# Patient Record
Sex: Female | Born: 1960 | Race: White | Hispanic: No | Marital: Married | Smoking: Former smoker
Health system: Southern US, Community
[De-identification: ages and names within clinical notes are randomized; demographics above are authoritative.]

## PROBLEM LIST (undated history)

## (undated) DIAGNOSIS — R011 Cardiac murmur, unspecified: Secondary | ICD-10-CM

## (undated) DIAGNOSIS — T8201XA Breakdown (mechanical) of heart valve prosthesis, initial encounter: Secondary | ICD-10-CM

## (undated) DIAGNOSIS — F419 Anxiety disorder, unspecified: Secondary | ICD-10-CM

## (undated) DIAGNOSIS — M797 Fibromyalgia: Secondary | ICD-10-CM

## (undated) DIAGNOSIS — R5382 Chronic fatigue, unspecified: Secondary | ICD-10-CM

## (undated) DIAGNOSIS — R519 Headache, unspecified: Secondary | ICD-10-CM

## (undated) DIAGNOSIS — D649 Anemia, unspecified: Secondary | ICD-10-CM

## (undated) DIAGNOSIS — F988 Other specified behavioral and emotional disorders with onset usually occurring in childhood and adolescence: Secondary | ICD-10-CM

## (undated) DIAGNOSIS — K5900 Constipation, unspecified: Secondary | ICD-10-CM

## (undated) DIAGNOSIS — M1990 Unspecified osteoarthritis, unspecified site: Secondary | ICD-10-CM

## (undated) DIAGNOSIS — T8859XA Other complications of anesthesia, initial encounter: Secondary | ICD-10-CM

## (undated) DIAGNOSIS — I350 Nonrheumatic aortic (valve) stenosis: Secondary | ICD-10-CM

## (undated) DIAGNOSIS — T4145XA Adverse effect of unspecified anesthetic, initial encounter: Secondary | ICD-10-CM

## (undated) DIAGNOSIS — I10 Essential (primary) hypertension: Secondary | ICD-10-CM

## (undated) DIAGNOSIS — F329 Major depressive disorder, single episode, unspecified: Secondary | ICD-10-CM

## (undated) DIAGNOSIS — M8580 Other specified disorders of bone density and structure, unspecified site: Secondary | ICD-10-CM

## (undated) DIAGNOSIS — E162 Hypoglycemia, unspecified: Secondary | ICD-10-CM

## (undated) DIAGNOSIS — N183 Chronic kidney disease, stage 3 unspecified: Secondary | ICD-10-CM

## (undated) DIAGNOSIS — E7800 Pure hypercholesterolemia, unspecified: Secondary | ICD-10-CM

## (undated) DIAGNOSIS — R0602 Shortness of breath: Secondary | ICD-10-CM

## (undated) DIAGNOSIS — K589 Irritable bowel syndrome without diarrhea: Secondary | ICD-10-CM

## (undated) DIAGNOSIS — E538 Deficiency of other specified B group vitamins: Secondary | ICD-10-CM

## (undated) DIAGNOSIS — M549 Dorsalgia, unspecified: Secondary | ICD-10-CM

## (undated) DIAGNOSIS — R002 Palpitations: Secondary | ICD-10-CM

## (undated) DIAGNOSIS — F32A Depression, unspecified: Secondary | ICD-10-CM

## (undated) DIAGNOSIS — I509 Heart failure, unspecified: Secondary | ICD-10-CM

## (undated) DIAGNOSIS — K760 Fatty (change of) liver, not elsewhere classified: Secondary | ICD-10-CM

## (undated) DIAGNOSIS — G4730 Sleep apnea, unspecified: Secondary | ICD-10-CM

## (undated) HISTORY — DX: Hypoglycemia, unspecified: E16.2

## (undated) HISTORY — DX: Anxiety disorder, unspecified: F41.9

## (undated) HISTORY — DX: Other specified behavioral and emotional disorders with onset usually occurring in childhood and adolescence: F98.8

## (undated) HISTORY — DX: Shortness of breath: R06.02

## (undated) HISTORY — DX: Sleep apnea, unspecified: G47.30

## (undated) HISTORY — DX: Fibromyalgia: M79.7

## (undated) HISTORY — DX: Headache, unspecified: R51.9

## (undated) HISTORY — DX: Other specified disorders of bone density and structure, unspecified site: M85.80

## (undated) HISTORY — DX: Depression, unspecified: F32.A

## (undated) HISTORY — DX: Dorsalgia, unspecified: M54.9

## (undated) HISTORY — DX: Major depressive disorder, single episode, unspecified: F32.9

## (undated) HISTORY — PX: PARTIAL HYSTERECTOMY: SHX80

## (undated) HISTORY — PX: TONSILLECTOMY: SUR1361

## (undated) HISTORY — PX: WISDOM TOOTH EXTRACTION: SHX21

## (undated) HISTORY — DX: Palpitations: R00.2

## (undated) HISTORY — DX: Chronic fatigue, unspecified: R53.82

## (undated) HISTORY — DX: Constipation, unspecified: K59.00

## (undated) HISTORY — DX: Pure hypercholesterolemia, unspecified: E78.00

## (undated) HISTORY — PX: COLONOSCOPY: SHX174

## (undated) HISTORY — PX: GASTRIC BYPASS: SHX52

## (undated) HISTORY — DX: Deficiency of other specified B group vitamins: E53.8

## (undated) HISTORY — DX: Chronic kidney disease, stage 3 unspecified: N18.30

## (undated) HISTORY — DX: Heart failure, unspecified: I50.9

## (undated) HISTORY — DX: Fatty (change of) liver, not elsewhere classified: K76.0

---
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
## 2014-09-10 DIAGNOSIS — R7989 Other specified abnormal findings of blood chemistry: Secondary | ICD-10-CM | POA: Insufficient documentation

## 2016-02-28 ENCOUNTER — Other Ambulatory Visit: Payer: Self-pay | Admitting: Family Medicine

## 2016-02-28 DIAGNOSIS — R7989 Other specified abnormal findings of blood chemistry: Secondary | ICD-10-CM

## 2016-02-28 DIAGNOSIS — R0602 Shortness of breath: Secondary | ICD-10-CM

## 2016-03-06 ENCOUNTER — Ambulatory Visit
Admission: RE | Admit: 2016-03-06 | Discharge: 2016-03-06 | Disposition: A | Discharge status: Home / Self Care | Payer: BLUE CROSS/BLUE SHIELD | Source: Ambulatory Visit | Attending: Family Medicine | Admitting: Family Medicine

## 2016-03-06 DIAGNOSIS — R7989 Other specified abnormal findings of blood chemistry: Secondary | ICD-10-CM

## 2016-03-06 DIAGNOSIS — R0602 Shortness of breath: Secondary | ICD-10-CM

## 2016-03-06 MED ORDER — IOPAMIDOL (ISOVUE-370) INJECTION 76%
100.0000 mL | Freq: Once | INTRAVENOUS | Status: AC | PRN
  Administered 2016-03-06: 100 mL via INTRAVENOUS

## 2016-03-09 ENCOUNTER — Other Ambulatory Visit: Payer: Self-pay

## 2016-03-15 ENCOUNTER — Telehealth: Payer: Self-pay | Admitting: Cardiovascular Disease

## 2016-03-15 NOTE — Telephone Encounter (Signed)
Records received from [facility] for apt on 03/31/16 with Dr Duke Salviaandolph. Records given to [employer] (medical records) CN

## 2016-03-31 ENCOUNTER — Encounter: Payer: Self-pay | Admitting: Cardiovascular Disease

## 2016-03-31 ENCOUNTER — Ambulatory Visit (INDEPENDENT_AMBULATORY_CARE_PROVIDER_SITE_OTHER): Payer: BLUE CROSS/BLUE SHIELD | Admitting: Cardiovascular Disease

## 2016-03-31 VITALS — BP 115/74 | HR 75 | Ht 66.0 in | Wt 307.2 lb

## 2016-03-31 DIAGNOSIS — G4730 Sleep apnea, unspecified: Secondary | ICD-10-CM | POA: Insufficient documentation

## 2016-03-31 DIAGNOSIS — R002 Palpitations: Secondary | ICD-10-CM | POA: Diagnosis not present

## 2016-03-31 DIAGNOSIS — I10 Essential (primary) hypertension: Secondary | ICD-10-CM | POA: Diagnosis not present

## 2016-03-31 DIAGNOSIS — E7800 Pure hypercholesterolemia, unspecified: Secondary | ICD-10-CM

## 2016-03-31 DIAGNOSIS — Z9884 Bariatric surgery status: Secondary | ICD-10-CM | POA: Insufficient documentation

## 2016-03-31 DIAGNOSIS — R011 Cardiac murmur, unspecified: Secondary | ICD-10-CM

## 2016-03-31 DIAGNOSIS — M797 Fibromyalgia: Secondary | ICD-10-CM | POA: Insufficient documentation

## 2016-03-31 HISTORY — DX: Palpitations: R00.2

## 2016-03-31 MED ORDER — ROSUVASTATIN CALCIUM 20 MG PO TABS: 20.0000 mg | ORAL_TABLET | Freq: Every day | ORAL | 1 refills | Status: DC

## 2016-03-31 NOTE — Progress Notes (Signed)
Cardiology Office Note   Date:  03/31/2016   ID:  Kimberly Castro, DOB [birth year], MRN [id number]  PCP:  No primary care provider on file.  Cardiologist:   Chilton Siiffany [place], MD   Chief Complaint  Patient presents with  . New Patient (Initial Visit)    palpitations; major cramping in legs. edema; in ankles. lightheaded; occasionally.      History of Present Illness: Kimberly Castro is a 55 y.o. female with OSA on CPAP, hyperlipidemia, hypertension and fibromyalgia who presents for an evaluation of palpitations.  [name] reports episodes of palpitations that have been occurring intermittently for the last 6-12 months.  The palpitations occur sporadically.  It sometimes it occurs most multiple times throughout the day and then she has other days when she has no symptoms. Each episode lasts for up to 10 or 15 minutes at a time. There is no associated shortness of breath or chest pain. She does have lightheadedness, but not at the same time when she has the palpitations.  The symptoms typically occur when she is at rest and do not occur with exertion. She walks 1 mile most days of the week and has no chest pain but does have some exertional shortness of breath that has been stable. She denies lower extremity edema, orthopnea, or PND.  She reported these symptoms to Dr. Lanora ManisElizabeth B and was referred to cardiology for further evaluation. Dr. Zachery DauerBarnes she had laboratory testing that revealed normal blood counts and normal electrolytes. 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  her thyroid function was normal. D-dimer was mildly elevated but she had a subsequent CT angiogram of the chest that was negative for PE.   In the past [name] drank up to two Diet Mt. Dews daily.  She now drinks one cup of coffee daily since reducing her caffeine intake she has not noted any changes in her palpitations.  She reports that she saw Dr. Enrigue CatenaBoshra Zakh[place]  [place] 10 years ago. She was noted to have a murmur. She underwent subsequent cardiac workup and Dr. Rockne MenghiniZakhary and was told that she likely had a murmur all her life. She hasn't had any heart failure symptoms.      Past Medical History:  Diagnosis Date  . ADD (attention deficit disorder)   . Chronic fatigue   . Depression   . Fibromyalgia   . Palpitations 03/31/2016  . Sleep apnea     No past surgical history on file.   Current Outpatient Prescriptions  Medication Sig Dispense Refill  . amphetamine-dextroamphetamine (ADDERALL XR) 30 MG 24 hr capsule 1    . clonazePAM (KLONOPIN) 0.5 MG tablet Take 0.5 mg by mouth 2 (two) times daily as needed.    . Cyanocobalamin (GNP VITAMIN B-12) 1000 MCG TBCR 1 tablet    . DULoxetine (CYMBALTA) 30 MG capsule Take 30 mg by mouth daily.    . fluticasone (FLONASE) 50 MCG/ACT nasal spray Place 1 spray into both nostrils as needed.  1  . HYDROcodone-acetaminophen (NORCO) 7.5-325 MG tablet Take 1 tablet by mouth as needed.  0  . olmesartan-hydrochlorothiazide (BENICAR HCT) 40-12.5 MG tablet Take 1 tablet by mouth daily.    Marland Kitchen. olmesartan-hydrochlorothiazide (BENICAR HCT) 40-25 MG tablet Take 1 tablet by mouth daily.  0  . predniSONE (DELTASONE) 20 MG tablet Take 20 mg by mouth daily.  0  . valACYclovir (VALTREX) 1000 MG tablet Take 1 tablet by mouth  daily.    . venlafaxine XR (EFFEXOR-XR) 150 MG 24 hr capsule Take 150 mg by mouth daily.  1  . zolpidem (AMBIEN) 10 MG tablet TK 1 T PO HS PRN  0  . rosuvastatin (CRESTOR) 20 MG tablet Take 1 tablet (20 mg total) by mouth daily. 90 tablet 1   No current facility-administered medications for this visit.     Allergies:   Dye fdc red 3 (erythrosine)    Social History:  The patient  reports that she has quit smoking. She does not have any smokeless tobacco history on file. She reports that she drinks alcohol. She reports that she does not use drugs.   Family History:  The patient's family history  includes AAA (abdominal aortic aneurysm) in her father; Atrial fibrillation in her mother; CAD in her brother; COPD in her brother, brother, father, and mother; Diabetes in her brother, maternal grandfather, maternal grandmother, and paternal grandmother; Heart disease in her father, maternal grandfather, maternal grandmother, and paternal grandfather; Heart failure in her mother; Kidney disease in her mother; Peripheral Artery Disease in her brother; Stroke in her brother and paternal grandmother.    ROS:  Please see the history of present illness.   Otherwise, review of systems are positive for none.   All other systems are reviewed and negative.    PHYSICAL EXAM: VS:  BP 115/74   Pulse 75   Ht 5\' 6"  (1.676 m)   Wt (!) 307 lb 3.2 oz (139.3 kg)   BMI 49.58 kg/m  , BMI Body mass index is 49.58 kg/m. GENERAL:  Well appearing HEENT:  Pupils equal round and reactive, fundi not visualized, oral mucosa unremarkable NECK:  No jugular venous distention, waveform within normal limits, carotid upstroke brisk and symmetric, no bruits, no thyromegaly LYMPHATICS:  No cervical adenopathy LUNGS:  Clear to auscultation bilaterally HEART:  RRR.  PMI not displaced or sustained,S1 and S2 within normal limits, no S3, no S4, no clicks, no rubs, III/VI systolic murmur ABD:  Flat, positive bowel sounds normal in frequency in pitch, no bruits, no rebound, no guarding, no midline pulsatile mass, no hepatomegaly, no splenomegaly EXT:  2 plus pulses throughout, no edema, no cyanosis no clubbing SKIN:  No rashes no nodules NEURO:  Cranial nerves II through XII grossly intact, motor grossly intact throughout PSYCH:  Cognitively intact, oriented to person place and time   EKG:  EKG is ordered today. The ekg ordered today demonstrates sinus rhythm rate 75 bpm.    Recent Labs: No results found for requested labs within last 8760 hours.    02/25/16: WBC 10.1, hemoglobin 12.1, hematocrit 36.2, platelets 319 Sodium  143, potassium 4.5, BUN 24, creatinine 1.05 AST 27, ALT 24 TSH 0.886 D-dimer 0.68 Troponin I less than 0.01 Total cholesterol 262, triglycerides 131, HDL 75, LDL 161  Lipid Panel No results found for: CHOL, TRIG, HDL, CHOLHDL, VLDL, LDLCALC, LDLDIRECT    Wt Readings from Last 3 Encounters:  03/31/16 (!) 307 lb 3.2 oz (139.3 kg)      ASSESSMENT AND PLAN:  # Palpitations: Ms. Kimberly Josephs has recurrent palpitations that are likely PACs or PVCs.  Laboratory testing revealed normal electrolytes and blood counts.  Her glucose was low at 44, which could contribute to palpitations.  Her EKG today revealed sinus rhythm, so we will get a 7 day Event Monitor to better evaluate.  # Hypertension:  Blood pressure is well controlled. Continue olmesartan and HCTZ.   # Hyperlipidemia: Lipids are poorly-controlled on  simvastatin.  She also reports muscle cramps.  We will stop simvastatin for 2 weeks and she will then start rosuvastatin 20 mg daily.  # Murmur: Asymptomatic.  We will obtain her prior records for review.   Current medicines are reviewed at length with the patient today.  The patient does not have concerns regarding medicines.  The following changes have been made:  Stop simvastatin.  Start rosuvastatin.   Labs/ tests ordered today include:   Orders Placed This Encounter  Procedures  . Cardiac event monitor  . EKG 12-Lead     Disposition:   FU with Jacory Kamel C. Duke Salvia, MD, [facility] in 6 weeks.    This note was written with the assistance of speech recognition software.  Please excuse any transcriptional errors.  Signed, Ismael Karge C. Duke Salvia, MD, [facility]  03/31/2016 1:37 PM    [facility]

## 2016-03-31 NOTE — Patient Instructions (Addendum)
Medication Instructions:  STOP YOUR SIMVASTATIN   START ROSUVASTATIN 20 MG ONE DAILY AFTER 2 WEEKS OF BEING OFF THE SIMVASTATIN   Labwork: NONE  Testing/Procedures: Your physician has recommended that you wear an event monitor. Event monitors are medical devices that record the heart's electrical activity. Doctors most often us these monitors to diagnose arrhythmias. Arrhythmias are problems with the speed or rhythm of the heartbeat. The monitor is a small, portable device. You can wear one while you do your normal daily activities. This is usually used to diagnose what is causing palpitations/syncope (passing out). 7 DAY   Follow-Up: Your physician recommends that you schedule a follow-up appointment in: 6 WEEKS   If you need a refill on your cardiac medications before your next appointment, please call your pharmacy.

## 2016-04-03 ENCOUNTER — Ambulatory Visit (INDEPENDENT_AMBULATORY_CARE_PROVIDER_SITE_OTHER): Payer: BLUE CROSS/BLUE SHIELD

## 2016-04-03 DIAGNOSIS — R002 Palpitations: Secondary | ICD-10-CM

## 2016-05-09 DIAGNOSIS — Z90710 Acquired absence of both cervix and uterus: Secondary | ICD-10-CM | POA: Insufficient documentation

## 2016-05-10 ENCOUNTER — Encounter: Payer: Self-pay | Admitting: *Deleted

## 2016-05-15 ENCOUNTER — Ambulatory Visit: Payer: BLUE CROSS/BLUE SHIELD | Admitting: Cardiovascular Disease

## 2016-05-15 NOTE — Progress Notes (Deleted)
Cardiology Office Note   Date:  05/15/2016   ID:  Kimberly Castro, DOB [birth year], MRN [id number]  PCP:  Kimberly LabellaMILLER,LISA LYNN, MD  Cardiologist:   Kimberly Siiffany [place], MD   No chief complaint on file.     History of Present Illness: Kimberly Castro is a 55 y.o. female with OSA on CPAP, hyperlipidemia, hypertension and fibromyalgia who presents for follow up on palpitations.  [name] was seen 03/31/16 and reported palpitations.  She exercises regularly and denies exertional symptoms.  [name] wore an 11 day event monitor 03/24/16 that revealed PACs, PVCs, and atrial runs.  Blood counts and electrolytes were within normal limits.  Her thyroid function was normal. D-dimer was mildly elevated but she had a subsequent CT angiogram of the chest that was negative for PE.       In the past [name] drank up to two Diet Mt. Dews daily.  She now drinks one cup of coffee daily since reducing her caffeine intake she has not noted any changes in her palpitations.  She reports that she saw Dr. Enrigue CatenaBoshra [place] in [place] 10 [place] ago. She was noted to have a murmur. She underwent subsequent cardiac workup and Dr. Rockne Castro and was told that she likely had a murmur all her life. She hasn't had any heart failure symptoms.      Past Medical History:  Diagnosis Date  . ADD (attention deficit disorder)   . Chronic fatigue   . Depression   . Fibromyalgia   . Palpitations 03/31/2016  . Sleep apnea     No past surgical history on file.   Current Outpatient Prescriptions  Medication Sig Dispense Refill  . amphetamine-dextroamphetamine (ADDERALL XR) 30 MG 24 hr capsule 1    . clonazePAM (KLONOPIN) 0.5 MG tablet Take 0.5 mg by mouth 2 (two) times daily as needed.    . Cyanocobalamin (GNP VITAMIN B-12) 1000 MCG TBCR 1 tablet    . DULoxetine (CYMBALTA) 30 MG capsule Take 30 mg by mouth daily.    . fluticasone (FLONASE) 50 MCG/ACT nasal spray Place 1 spray into both nostrils as needed.  1  .  HYDROcodone-acetaminophen (NORCO) 7.5-325 MG tablet Take 1 tablet by mouth as needed.  0  . olmesartan-hydrochlorothiazide (BENICAR HCT) 40-12.5 MG tablet Take 1 tablet by mouth daily.    Marland Kitchen. olmesartan-hydrochlorothiazide (BENICAR HCT) 40-25 MG tablet Take 1 tablet by mouth daily.  0  . predniSONE (DELTASONE) 20 MG tablet Take 20 mg by mouth daily.  0  . rosuvastatin (CRESTOR) 20 MG tablet Take 1 tablet (20 mg total) by mouth daily. 90 tablet 1  . valACYclovir (VALTREX) 1000 MG tablet Take 1 tablet by mouth daily.    Marland Kitchen. venlafaxine XR (EFFEXOR-XR) 150 MG 24 hr capsule Take 150 mg by mouth daily.  1  . zolpidem (AMBIEN) 10 MG tablet TK 1 T PO HS PRN  0   No current facility-administered medications for this visit.     Allergies:   Dye fdc red 3 (erythrosine)    Social History:  The patient  reports that she has quit smoking. She does not have any smokeless tobacco history on file. She reports that she drinks alcohol. She reports that she does not use drugs.   Family History:  The patient's family history includes AAA (abdominal aortic aneurysm) in her father; Atrial fibrillation in her mother; CAD in her brother; COPD in her brother, brother, father, and mother; Diabetes in her brother, maternal grandfather, maternal  grandmother, and paternal grandmother; Heart disease in her father, maternal grandfather, maternal grandmother, and paternal grandfather; Heart failure in her mother; Kidney disease in her mother; Peripheral Artery Disease in her brother; Stroke in her brother and paternal grandmother.    ROS:  Please see the history of present illness.   Otherwise, review of systems are positive for none.   All other systems are reviewed and negative.    PHYSICAL EXAM: VS:  There were no vitals taken for this visit. , BMI There is no height or weight on file to calculate BMI. GENERAL:  Well appearing HEENT:  Pupils equal round and reactive, fundi not visualized, oral mucosa unremarkable NECK:   No jugular venous distention, waveform within normal limits, carotid upstroke brisk and symmetric, no bruits, no thyromegaly LYMPHATICS:  No cervical adenopathy LUNGS:  Clear to auscultation bilaterally HEART:  RRR.  PMI not displaced or sustained,S1 and S2 within normal limits, no S3, no S4, no clicks, no rubs, III/VI systolic murmur ABD:  Flat, positive bowel sounds normal in frequency in pitch, no bruits, no rebound, no guarding, no midline pulsatile mass, no hepatomegaly, no splenomegaly EXT:  2 plus pulses throughout, no edema, no cyanosis no clubbing SKIN:  No rashes no nodules NEURO:  Cranial nerves II through XII grossly intact, motor grossly intact throughout PSYCH:  Cognitively intact, oriented to person place and time   EKG:  EKG is ordered today. The ekg ordered today demonstrates sinus rhythm rate 75 bpm.   11 Day Event Monitor 04/03/16:  Quality: Fair.  Baseline artifact. Predominant rhythm: sinus rhythm  PVCs, PACs and atrial runs were noted  Recent Labs: No results found for requested labs within last 8760 hours.    02/25/16: WBC 10.1, hemoglobin 12.1, hematocrit 36.2, platelets 319 Sodium 143, potassium 4.5, BUN 24, creatinine 1.05 AST 27, ALT 24 TSH 0.886 D-dimer 0.68 Troponin I less than 0.01 Total cholesterol 262, triglycerides 131, HDL 75, LDL 161  Lipid Panel No results found for: CHOL, TRIG, HDL, CHOLHDL, VLDL, LDLCALC, LDLDIRECT    Wt Readings from Last 3 Encounters:  03/31/16 (!) 139.3 kg (307 lb 3.2 oz)      ASSESSMENT AND PLAN:  # Palpitations: [name] has recurrent palpitations that are likely PACs or PVCs.  Laboratory testing revealed normal electrolytes and blood counts.  Her glucose was low at 44, which could contribute to palpitations.  Her EKG today revealed sinus rhythm, so we will get a 7 day Event Monitor to better evaluate.  # Hypertension:  Blood pressure is well controlled. Continue olmesartan and HCTZ.   # Hyperlipidemia:  Lipids are poorly-controlled on simvastatin.  She also reports muscle cramps.  We will stop simvastatin for 2 weeks and she will then start rosuvastatin 20 mg daily.  # Murmur: Asymptomatic.  We will obtain her prior records for review.   Current medicines are reviewed at length with the patient today.  The patient does not have concerns regarding medicines.  The following changes have been made:  Stop simvastatin.  Start rosuvastatin.   Labs/ tests ordered today include:   No orders of the defined types were placed in this encounter.    Disposition:   FU with Bettyanne Dittman C. Duke Salviaandolph, MD, [facility] in 6 weeks.    This note was written with the assistance of speech recognition software.  Please excuse any transcriptional errors.  Signed, Lexandra Rettke C. Duke Salviaandolph, MD, [facility]  05/15/2016 8:04 AM    [facility]

## 2016-08-12 ENCOUNTER — Emergency Department (HOSPITAL_COMMUNITY): Payer: Managed Care, Other (non HMO)

## 2016-08-12 ENCOUNTER — Encounter (HOSPITAL_COMMUNITY): Admission: EM | Disposition: A | Discharge status: Home / Self Care | Payer: Self-pay | Source: Home / Self Care

## 2016-08-12 ENCOUNTER — Encounter (HOSPITAL_COMMUNITY): Payer: Self-pay

## 2016-08-12 ENCOUNTER — Observation Stay (HOSPITAL_COMMUNITY): Payer: Managed Care, Other (non HMO) | Admitting: Anesthesiology

## 2016-08-12 ENCOUNTER — Inpatient Hospital Stay (HOSPITAL_COMMUNITY)
Admission: EM | Admit: 2016-08-12 | Discharge: 2016-08-16 | DRG: 354 | Disposition: A | Discharge status: Home / Self Care | Payer: Managed Care, Other (non HMO) | Attending: Orthopaedic Surgery | Admitting: Orthopaedic Surgery

## 2016-08-12 DIAGNOSIS — I10 Essential (primary) hypertension: Secondary | ICD-10-CM | POA: Diagnosis present

## 2016-08-12 DIAGNOSIS — F988 Other specified behavioral and emotional disorders with onset usually occurring in childhood and adolescence: Secondary | ICD-10-CM | POA: Diagnosis present

## 2016-08-12 DIAGNOSIS — Z833 Family history of diabetes mellitus: Secondary | ICD-10-CM

## 2016-08-12 DIAGNOSIS — M797 Fibromyalgia: Secondary | ICD-10-CM | POA: Diagnosis present

## 2016-08-12 DIAGNOSIS — R51 Headache: Secondary | ICD-10-CM | POA: Diagnosis not present

## 2016-08-12 DIAGNOSIS — Z87891 Personal history of nicotine dependence: Secondary | ICD-10-CM

## 2016-08-12 DIAGNOSIS — Z823 Family history of stroke: Secondary | ICD-10-CM

## 2016-08-12 DIAGNOSIS — Z825 Family history of asthma and other chronic lower respiratory diseases: Secondary | ICD-10-CM

## 2016-08-12 DIAGNOSIS — R1013 Epigastric pain: Secondary | ICD-10-CM | POA: Diagnosis not present

## 2016-08-12 DIAGNOSIS — Z841 Family history of disorders of kidney and ureter: Secondary | ICD-10-CM

## 2016-08-12 DIAGNOSIS — Z7951 Long term (current) use of inhaled steroids: Secondary | ICD-10-CM

## 2016-08-12 DIAGNOSIS — B001 Herpesviral vesicular dermatitis: Secondary | ICD-10-CM | POA: Diagnosis not present

## 2016-08-12 DIAGNOSIS — Z0189 Encounter for other specified special examinations: Secondary | ICD-10-CM

## 2016-08-12 DIAGNOSIS — R05 Cough: Secondary | ICD-10-CM | POA: Diagnosis not present

## 2016-08-12 DIAGNOSIS — Z6841 Body Mass Index (BMI) 40.0 and over, adult: Secondary | ICD-10-CM

## 2016-08-12 DIAGNOSIS — Z9884 Bariatric surgery status: Secondary | ICD-10-CM

## 2016-08-12 DIAGNOSIS — K56609 Unspecified intestinal obstruction, unspecified as to partial versus complete obstruction: Secondary | ICD-10-CM | POA: Diagnosis present

## 2016-08-12 DIAGNOSIS — E662 Morbid (severe) obesity with alveolar hypoventilation: Secondary | ICD-10-CM | POA: Diagnosis present

## 2016-08-12 DIAGNOSIS — Z91041 Radiographic dye allergy status: Secondary | ICD-10-CM

## 2016-08-12 DIAGNOSIS — Z8249 Family history of ischemic heart disease and other diseases of the circulatory system: Secondary | ICD-10-CM

## 2016-08-12 DIAGNOSIS — R0981 Nasal congestion: Secondary | ICD-10-CM | POA: Diagnosis not present

## 2016-08-12 DIAGNOSIS — R21 Rash and other nonspecific skin eruption: Secondary | ICD-10-CM | POA: Diagnosis present

## 2016-08-12 DIAGNOSIS — K219 Gastro-esophageal reflux disease without esophagitis: Secondary | ICD-10-CM | POA: Diagnosis present

## 2016-08-12 DIAGNOSIS — F329 Major depressive disorder, single episode, unspecified: Secondary | ICD-10-CM | POA: Diagnosis present

## 2016-08-12 DIAGNOSIS — K460 Unspecified abdominal hernia with obstruction, without gangrene: Secondary | ICD-10-CM | POA: Diagnosis not present

## 2016-08-12 DIAGNOSIS — Z91048 Other nonmedicinal substance allergy status: Secondary | ICD-10-CM

## 2016-08-12 HISTORY — PX: LAPAROSCOPY: SHX197

## 2016-08-12 HISTORY — DX: Essential (primary) hypertension: I10

## 2016-08-12 LAB — CBC
HCT: 38.6 % (ref 36.0–46.0)
Hemoglobin: 12.7 g/dL (ref 12.0–15.0)
MCH: 28.9 pg (ref 26.0–34.0)
MCHC: 32.9 g/dL (ref 30.0–36.0)
MCV: 87.7 fL (ref 78.0–100.0)
Platelets: 286 10*3/uL (ref 150–400)
RBC: 4.4 MIL/uL (ref 3.87–5.11)
RDW: 13.1 % (ref 11.5–15.5)
WBC: 8.9 10*3/uL (ref 4.0–10.5)

## 2016-08-12 LAB — COMPREHENSIVE METABOLIC PANEL
ALT: 29 U/L (ref 14–54)
AST: 27 U/L (ref 15–41)
Albumin: 4 g/dL (ref 3.5–5.0)
Alkaline Phosphatase: 87 U/L (ref 38–126)
Anion gap: 14 (ref 5–15)
BUN: 15 mg/dL (ref 6–20)
CO2: 26 mmol/L (ref 22–32)
Calcium: 9.7 mg/dL (ref 8.9–10.3)
Chloride: 99 mmol/L — ABNORMAL LOW (ref 101–111)
Creatinine, Ser: 0.9 mg/dL (ref 0.44–1.00)
GFR calc Af Amer: >60 mL/min (ref >60)
GFR calc non Af Amer: >60 mL/min (ref >60)
Glucose, Bld: 101 mg/dL — ABNORMAL HIGH (ref 65–99)
Potassium: 3.7 mmol/L (ref 3.5–5.1)
Sodium: 139 mmol/L (ref 135–145)
Total Bilirubin: 0.3 mg/dL (ref 0.3–1.2)
Total Protein: 7.2 g/dL (ref 6.5–8.1)

## 2016-08-12 LAB — CREATININE, SERUM
Creatinine, Ser: 0.93 mg/dL (ref 0.44–1.00)
GFR calc Af Amer: >60 mL/min (ref >60)
GFR calc non Af Amer: >60 mL/min (ref >60)

## 2016-08-12 LAB — URINALYSIS, ROUTINE W REFLEX MICROSCOPIC
Bilirubin Urine: NEGATIVE
Glucose, UA: NEGATIVE mg/dL
Hgb urine dipstick: NEGATIVE
Ketones, ur: NEGATIVE mg/dL
Leukocytes, UA: NEGATIVE
Nitrite: NEGATIVE
Protein, ur: NEGATIVE mg/dL
Specific Gravity, Urine: >1.046 — ABNORMAL HIGH (ref 1.005–1.030)
pH: 7 (ref 5.0–8.0)

## 2016-08-12 LAB — CBC WITH DIFFERENTIAL/PLATELET
Basophils Absolute: 0 10*3/uL (ref 0.0–0.1)
Basophils Relative: 0 %
Eosinophils Absolute: 0.2 10*3/uL (ref 0.0–0.7)
Eosinophils Relative: 2 %
HCT: 38 % (ref 36.0–46.0)
Hemoglobin: 12.5 g/dL (ref 12.0–15.0)
Lymphocytes Relative: 32 %
Lymphs Abs: 2.6 10*3/uL (ref 0.7–4.0)
MCH: 29.1 pg (ref 26.0–34.0)
MCHC: 32.9 g/dL (ref 30.0–36.0)
MCV: 88.6 fL (ref 78.0–100.0)
Monocytes Absolute: 0.8 10*3/uL (ref 0.1–1.0)
Monocytes Relative: 10 %
Neutro Abs: 4.6 10*3/uL (ref 1.7–7.7)
Neutrophils Relative %: 56 %
Platelets: 260 10*3/uL (ref 150–400)
RBC: 4.29 MIL/uL (ref 3.87–5.11)
RDW: 13.3 % (ref 11.5–15.5)
WBC: 8.1 10*3/uL (ref 4.0–10.5)

## 2016-08-12 LAB — SURGICAL PCR SCREEN
MRSA, PCR: NEGATIVE
Staphylococcus aureus: NEGATIVE

## 2016-08-12 LAB — LIPASE, BLOOD: Lipase: 24 U/L (ref 11–51)

## 2016-08-12 SURGERY — LAPAROSCOPY, DIAGNOSTIC
Anesthesia: General

## 2016-08-12 MED ORDER — SODIUM CHLORIDE 0.9 % IV SOLN
INTRAVENOUS | Status: DC
  Administered 2016-08-12 – 2016-08-14 (×8): via INTRAVENOUS

## 2016-08-12 MED ORDER — DEXTROSE 5 % IV SOLN
3.0000 g | Freq: Once | INTRAVENOUS | Status: DC
  Filled 2016-08-12: qty 3000

## 2016-08-12 MED ORDER — PROPOFOL 10 MG/ML IV BOLUS
INTRAVENOUS | Status: AC
  Filled 2016-08-12: qty 20

## 2016-08-12 MED ORDER — PHENYLEPHRINE 40 MCG/ML (10ML) SYRINGE FOR IV PUSH (FOR BLOOD PRESSURE SUPPORT)
PREFILLED_SYRINGE | INTRAVENOUS | Status: AC
  Filled 2016-08-12: qty 30

## 2016-08-12 MED ORDER — ONDANSETRON 4 MG PO TBDP
4.0000 mg | ORAL_TABLET | Freq: Four times a day (QID) | ORAL | Status: DC | PRN
Start: 2016-08-12 — End: 2016-08-16

## 2016-08-12 MED ORDER — LIDOCAINE 2% (20 MG/ML) 5 ML SYRINGE
INTRAMUSCULAR | Status: AC
  Filled 2016-08-12: qty 5

## 2016-08-12 MED ORDER — PHENYLEPHRINE HCL 10 MG/ML IJ SOLN
INTRAMUSCULAR | Status: DC | PRN
  Administered 2016-08-12: 50 ug/min via INTRAVENOUS

## 2016-08-12 MED ORDER — MIDAZOLAM HCL 2 MG/2ML IJ SOLN
INTRAMUSCULAR | Status: AC
  Filled 2016-08-12: qty 2

## 2016-08-12 MED ORDER — EPHEDRINE SULFATE 50 MG/ML IJ SOLN
INTRAMUSCULAR | Status: DC | PRN
  Administered 2016-08-12 (×2): 10 mg via INTRAVENOUS
  Administered 2016-08-12: 5 mg via INTRAVENOUS

## 2016-08-12 MED ORDER — IOPAMIDOL (ISOVUE-300) INJECTION 61%
INTRAVENOUS | Status: AC
  Administered 2016-08-12: 100 mL
  Filled 2016-08-12: qty 100

## 2016-08-12 MED ORDER — OXYCODONE HCL 5 MG PO TABS: 5.0000 mg | ORAL_TABLET | Freq: Once | ORAL | Status: DC | PRN

## 2016-08-12 MED ORDER — ROCURONIUM BROMIDE 50 MG/5ML IV SOSY
PREFILLED_SYRINGE | INTRAVENOUS | Status: AC
  Filled 2016-08-12: qty 5

## 2016-08-12 MED ORDER — FENTANYL CITRATE (PF) 100 MCG/2ML IJ SOLN
INTRAMUSCULAR | Status: AC
  Filled 2016-08-12: qty 2

## 2016-08-12 MED ORDER — CEFAZOLIN SODIUM 1 G IJ SOLR
INTRAMUSCULAR | Status: DC | PRN
  Administered 2016-08-12: 3 g via INTRAMUSCULAR

## 2016-08-12 MED ORDER — PROMETHAZINE HCL 25 MG/ML IJ SOLN
12.5000 mg | Freq: Once | INTRAMUSCULAR | Status: AC
  Administered 2016-08-12: 12.5 mg via INTRAVENOUS
  Filled 2016-08-12: qty 1

## 2016-08-12 MED ORDER — ONDANSETRON HCL 4 MG/2ML IJ SOLN
4.0000 mg | Freq: Once | INTRAMUSCULAR | Status: AC
  Administered 2016-08-12: 4 mg via INTRAVENOUS
  Filled 2016-08-12: qty 2

## 2016-08-12 MED ORDER — FENTANYL CITRATE (PF) 100 MCG/2ML IJ SOLN
25.0000 ug | INTRAMUSCULAR | Status: DC | PRN
  Administered 2016-08-12: 25 ug via INTRAVENOUS
  Administered 2016-08-12: 50 ug via INTRAVENOUS

## 2016-08-12 MED ORDER — CEFAZOLIN (ANCEF) 1 G IV SOLR: 3.0000 g | INTRAVENOUS | Status: DC

## 2016-08-12 MED ORDER — ACETAMINOPHEN 325 MG PO TABS
650.0000 mg | ORAL_TABLET | Freq: Four times a day (QID) | ORAL | Status: DC | PRN
  Administered 2016-08-13 (×2): 650 mg via ORAL
  Filled 2016-08-12 (×2): qty 2

## 2016-08-12 MED ORDER — ONDANSETRON HCL 4 MG/2ML IJ SOLN: 4.0000 mg | Freq: Four times a day (QID) | INTRAMUSCULAR | Status: DC | PRN

## 2016-08-12 MED ORDER — 0.9 % SODIUM CHLORIDE (POUR BTL) OPTIME
TOPICAL | Status: DC | PRN
Start: 2016-08-12 — End: 2016-08-12
  Administered 2016-08-12: 1000 mL

## 2016-08-12 MED ORDER — MORPHINE SULFATE (PF) 4 MG/ML IV SOLN
INTRAVENOUS | Status: AC
  Filled 2016-08-12: qty 1

## 2016-08-12 MED ORDER — ONDANSETRON HCL 4 MG/2ML IJ SOLN
INTRAMUSCULAR | Status: AC
  Filled 2016-08-12: qty 2

## 2016-08-12 MED ORDER — PHENYLEPHRINE 40 MCG/ML (10ML) SYRINGE FOR IV PUSH (FOR BLOOD PRESSURE SUPPORT)
PREFILLED_SYRINGE | INTRAVENOUS | Status: DC | PRN
  Administered 2016-08-12: 120 ug via INTRAVENOUS
  Administered 2016-08-12: 80 ug via INTRAVENOUS
  Administered 2016-08-12: 40 ug via INTRAVENOUS
  Administered 2016-08-12: 80 ug via INTRAVENOUS

## 2016-08-12 MED ORDER — PROMETHAZINE HCL 25 MG/ML IJ SOLN
INTRAMUSCULAR | Status: AC
  Administered 2016-08-12: 12.5 mg via INTRAVENOUS
  Filled 2016-08-12: qty 1

## 2016-08-12 MED ORDER — ACETAMINOPHEN 650 MG RE SUPP
650.0000 mg | Freq: Four times a day (QID) | RECTAL | Status: DC | PRN
  Filled 2016-08-12: qty 1

## 2016-08-12 MED ORDER — FENTANYL CITRATE (PF) 100 MCG/2ML IJ SOLN
100.0000 ug | Freq: Once | INTRAMUSCULAR | Status: AC
  Administered 2016-08-12: 100 ug via INTRAVENOUS
  Filled 2016-08-12: qty 2

## 2016-08-12 MED ORDER — FENTANYL CITRATE (PF) 100 MCG/2ML IJ SOLN
INTRAMUSCULAR | Status: AC
  Administered 2016-08-12: 25 ug via INTRAVENOUS
  Filled 2016-08-12: qty 2

## 2016-08-12 MED ORDER — DIPHENHYDRAMINE HCL 50 MG/ML IJ SOLN: 25.0000 mg | Freq: Four times a day (QID) | INTRAMUSCULAR | Status: DC | PRN

## 2016-08-12 MED ORDER — HYDROMORPHONE HCL 2 MG/ML IJ SOLN
1.0000 mg | Freq: Once | INTRAMUSCULAR | Status: AC
  Administered 2016-08-12: 1 mg via INTRAVENOUS
  Filled 2016-08-12: qty 1

## 2016-08-12 MED ORDER — FENTANYL CITRATE (PF) 100 MCG/2ML IJ SOLN
50.0000 ug | Freq: Once | INTRAMUSCULAR | Status: AC
  Administered 2016-08-12: 50 ug via INTRAVENOUS
  Filled 2016-08-12: qty 2

## 2016-08-12 MED ORDER — MIDAZOLAM HCL 5 MG/5ML IJ SOLN
INTRAMUSCULAR | Status: DC | PRN
  Administered 2016-08-12: 2 mg via INTRAVENOUS

## 2016-08-12 MED ORDER — SUGAMMADEX SODIUM 200 MG/2ML IV SOLN
INTRAVENOUS | Status: DC | PRN
  Administered 2016-08-12: 400 mg via INTRAVENOUS

## 2016-08-12 MED ORDER — OXYCODONE HCL 5 MG/5ML PO SOLN: 5.0000 mg | Freq: Once | ORAL | Status: DC | PRN

## 2016-08-12 MED ORDER — LACTATED RINGERS IV SOLN
INTRAVENOUS | Status: DC
  Administered 2016-08-12: 14:00:00 via INTRAVENOUS

## 2016-08-12 MED ORDER — METOCLOPRAMIDE HCL 5 MG/ML IJ SOLN
INTRAMUSCULAR | Status: AC
  Filled 2016-08-12: qty 2

## 2016-08-12 MED ORDER — DIATRIZOATE MEGLUMINE & SODIUM 66-10 % PO SOLN: 90.0000 mL | Freq: Once | ORAL | Status: DC

## 2016-08-12 MED ORDER — METOCLOPRAMIDE HCL 5 MG/ML IJ SOLN
10.0000 mg | Freq: Once | INTRAMUSCULAR | Status: AC
  Administered 2016-08-12: 10 mg via INTRAVENOUS
  Filled 2016-08-12: qty 2

## 2016-08-12 MED ORDER — ROCURONIUM BROMIDE 100 MG/10ML IV SOLN
INTRAVENOUS | Status: DC | PRN
  Administered 2016-08-12: 50 mg via INTRAVENOUS
  Administered 2016-08-12: 20 mg via INTRAVENOUS
  Administered 2016-08-12: 10 mg via INTRAVENOUS

## 2016-08-12 MED ORDER — ENOXAPARIN SODIUM 40 MG/0.4ML ~~LOC~~ SOLN
40.0000 mg | SUBCUTANEOUS | Status: DC
  Administered 2016-08-13 – 2016-08-15 (×3): 40 mg via SUBCUTANEOUS
  Filled 2016-08-12 (×3): qty 0.4

## 2016-08-12 MED ORDER — PANTOPRAZOLE SODIUM 40 MG IV SOLR
40.0000 mg | Freq: Every day | INTRAVENOUS | Status: DC
  Administered 2016-08-12 – 2016-08-14 (×3): 40 mg via INTRAVENOUS
  Filled 2016-08-12 (×3): qty 40

## 2016-08-12 MED ORDER — CEFAZOLIN SODIUM 1 G IJ SOLR
INTRAMUSCULAR | Status: AC
  Filled 2016-08-12: qty 60

## 2016-08-12 MED ORDER — LIDOCAINE HCL (CARDIAC) 20 MG/ML IV SOLN
INTRAVENOUS | Status: DC | PRN
  Administered 2016-08-12: 40 mg via INTRAVENOUS

## 2016-08-12 MED ORDER — PROPOFOL 10 MG/ML IV BOLUS
INTRAVENOUS | Status: DC | PRN
  Administered 2016-08-12: 140 mg via INTRAVENOUS

## 2016-08-12 MED ORDER — FENTANYL CITRATE (PF) 100 MCG/2ML IJ SOLN
INTRAMUSCULAR | Status: DC | PRN
  Administered 2016-08-12 (×2): 50 ug via INTRAVENOUS

## 2016-08-12 MED ORDER — SODIUM CHLORIDE 0.9 % IR SOLN
Status: DC | PRN
  Administered 2016-08-12: 1

## 2016-08-12 MED ORDER — ALBUMIN HUMAN 5 % IV SOLN
INTRAVENOUS | Status: DC | PRN
  Administered 2016-08-12 (×3): via INTRAVENOUS

## 2016-08-12 MED ORDER — LACTATED RINGERS IV SOLN
INTRAVENOUS | Status: DC | PRN
  Administered 2016-08-12 (×2): via INTRAVENOUS

## 2016-08-12 MED ORDER — EPHEDRINE 5 MG/ML INJ
INTRAVENOUS | Status: AC
  Filled 2016-08-12: qty 30

## 2016-08-12 MED ORDER — BUPIVACAINE HCL (PF) 0.25 % IJ SOLN
INTRAMUSCULAR | Status: DC | PRN
Start: 2016-08-12 — End: 2016-08-12
  Administered 2016-08-12: 8 mL

## 2016-08-12 MED ORDER — SUCCINYLCHOLINE CHLORIDE 20 MG/ML IJ SOLN
INTRAMUSCULAR | Status: DC | PRN
  Administered 2016-08-12: 80 mg via INTRAVENOUS

## 2016-08-12 MED ORDER — ONDANSETRON HCL 4 MG/2ML IJ SOLN
INTRAMUSCULAR | Status: DC | PRN
  Administered 2016-08-12: 4 mg via INTRAVENOUS

## 2016-08-12 MED ORDER — EPHEDRINE SULFATE-NACL 50-0.9 MG/10ML-% IV SOSY
PREFILLED_SYRINGE | INTRAVENOUS | Status: DC | PRN
  Administered 2016-08-12 (×2): 10 mg via INTRAVENOUS

## 2016-08-12 MED ORDER — MORPHINE SULFATE (PF) 4 MG/ML IV SOLN
2.0000 mg | INTRAVENOUS | Status: DC | PRN
  Administered 2016-08-12 – 2016-08-13 (×5): 4 mg via INTRAVENOUS
  Filled 2016-08-12 (×4): qty 1

## 2016-08-12 MED ORDER — LACTATED RINGERS IV SOLN
INTRAVENOUS | Status: DC | PRN
  Administered 2016-08-12 (×2): via INTRAVENOUS

## 2016-08-12 MED ORDER — PHENYLEPHRINE HCL 10 MG/ML IJ SOLN
INTRAMUSCULAR | Status: AC
  Filled 2016-08-12: qty 2

## 2016-08-12 MED ORDER — BUPIVACAINE HCL (PF) 0.25 % IJ SOLN
INTRAMUSCULAR | Status: AC
  Filled 2016-08-12: qty 30

## 2016-08-12 MED ORDER — MORPHINE SULFATE (PF) 4 MG/ML IV SOLN
INTRAVENOUS | Status: AC
  Administered 2016-08-12: 4 mg via INTRAVENOUS
  Filled 2016-08-12: qty 1

## 2016-08-12 SURGICAL SUPPLY — 59 items
APPLIER CLIP 5 13 M/L LIGAMAX5 (MISCELLANEOUS)
BLADE CLIPPER SURG (BLADE) IMPLANT
CANISTER SUCT 3000ML PPV (MISCELLANEOUS) ×2 IMPLANT
CHLORAPREP W/TINT 26ML (MISCELLANEOUS) ×2 IMPLANT
CLIP APPLIE 5 13 M/L LIGAMAX5 (MISCELLANEOUS) IMPLANT
COVER SURGICAL LIGHT HANDLE (MISCELLANEOUS) ×2 IMPLANT
DECANTER SPIKE VIAL GLASS SM (MISCELLANEOUS) ×2 IMPLANT
DERMABOND ADHESIVE PROPEN (GAUZE/BANDAGES/DRESSINGS) ×1
DERMABOND ADVANCED (GAUZE/BANDAGES/DRESSINGS) ×2
DERMABOND ADVANCED .7 DNX12 (GAUZE/BANDAGES/DRESSINGS) ×2 IMPLANT
DERMABOND ADVANCED .7 DNX6 (GAUZE/BANDAGES/DRESSINGS) ×1 IMPLANT
DEVICE SUTURE ENDOST 10MM (ENDOMECHANICALS) ×2 IMPLANT
DRAPE C-ARM 42X72 X-RAY (DRAPES) IMPLANT
DRAPE LAPAROSCOPIC ABDOMINAL (DRAPES) ×2 IMPLANT
DRAPE WARM FLUID 44X44 (DRAPE) IMPLANT
DRSG OPSITE POSTOP 4X10 (GAUZE/BANDAGES/DRESSINGS) IMPLANT
DRSG OPSITE POSTOP 4X8 (GAUZE/BANDAGES/DRESSINGS) IMPLANT
ELECT BLADE 6.5 EXT (BLADE) IMPLANT
ELECT CAUTERY BLADE 6.4 (BLADE) IMPLANT
ELECT REM PT RETURN 9FT ADLT (ELECTROSURGICAL) ×2
ELECTRODE REM PT RTRN 9FT ADLT (ELECTROSURGICAL) ×1 IMPLANT
GLOVE SURG SIGNA 7.5 PF LTX (GLOVE) IMPLANT
GOWN STRL REUS W/ TWL LRG LVL3 (GOWN DISPOSABLE) ×2 IMPLANT
GOWN STRL REUS W/ TWL XL LVL3 (GOWN DISPOSABLE) ×1 IMPLANT
GOWN STRL REUS W/TWL LRG LVL3 (GOWN DISPOSABLE) ×2
GOWN STRL REUS W/TWL XL LVL3 (GOWN DISPOSABLE) ×1
KIT BASIN OR (CUSTOM PROCEDURE TRAY) ×2 IMPLANT
KIT ROOM TURNOVER OR (KITS) ×2 IMPLANT
LIGASURE IMPACT 36 18CM CVD LR (INSTRUMENTS) IMPLANT
NS IRRIG 1000ML POUR BTL (IV SOLUTION) ×2 IMPLANT
PACK GENERAL/GYN (CUSTOM PROCEDURE TRAY) IMPLANT
PAD ARMBOARD 7.5X6 YLW CONV (MISCELLANEOUS) ×2 IMPLANT
RELOAD ENDO STITCH 2.0 (ENDOMECHANICALS) ×3
SCISSORS LAP 5X35 DISP (ENDOMECHANICALS) IMPLANT
SET IRRIG TUBING LAPAROSCOPIC (IRRIGATION / IRRIGATOR) ×2 IMPLANT
SLEEVE ENDOPATH XCEL 5M (ENDOMECHANICALS) ×6 IMPLANT
SPECIMEN JAR LARGE (MISCELLANEOUS) IMPLANT
SPONGE LAP 18X18 X RAY DECT (DISPOSABLE) IMPLANT
STAPLER VISISTAT 35W (STAPLE) ×2 IMPLANT
SUCTION POOLE TIP (SUCTIONS) ×2 IMPLANT
SUT MNCRL AB 4-0 PS2 18 (SUTURE) ×4 IMPLANT
SUT MON AB 4-0 PC3 18 (SUTURE) IMPLANT
SUT PDS AB 1 TP1 96 (SUTURE) IMPLANT
SUT RELOAD ENDO STITCH 2.0 (ENDOMECHANICALS) ×3
SUT SILK 2 0 SH CR/8 (SUTURE) IMPLANT
SUT SILK 2 0 TIES 10X30 (SUTURE) IMPLANT
SUT SILK 3 0 SH CR/8 (SUTURE) IMPLANT
SUT SILK 3 0 TIES 10X30 (SUTURE) IMPLANT
SUT VIC AB 3-0 SH 18 (SUTURE) IMPLANT
SUTURE RELOAD ENDO STITCH 2.0 (ENDOMECHANICALS) ×3 IMPLANT
TOWEL OR 17X24 6PK STRL BLUE (TOWEL DISPOSABLE) ×2 IMPLANT
TOWEL OR 17X26 10 PK STRL BLUE (TOWEL DISPOSABLE) ×2 IMPLANT
TRAY FOLEY W/METER SILVER 16FR (SET/KITS/TRAYS/PACK) IMPLANT
TRAY LAPAROSCOPIC (CUSTOM PROCEDURE TRAY) ×2 IMPLANT
TRAY LAPAROSCOPIC MC (CUSTOM PROCEDURE TRAY) IMPLANT
TROCAR XCEL NON-BLD 11X100MML (ENDOMECHANICALS) IMPLANT
TROCAR XCEL NON-BLD 5MMX100MML (ENDOMECHANICALS) ×2 IMPLANT
TUBING INSUFFLATION (TUBING) ×2 IMPLANT
YANKAUER SUCT BULB TIP NO VENT (SUCTIONS) IMPLANT

## 2016-08-12 NOTE — ED Provider Notes (Signed)
8:54 AM At time of transfer of care, patient is awaiting results of CT scan.  CT shows evidence of small bowel obstruction.  General surgery called, they evaluated the patient and will admit for further management. Patient admitted in stable condition.   Clinical Impression: 1. Encounter for imaging study to confirm nasogastric (NG) tube placement   2. Small bowel obstruction     Disposition: Admit to Gen. Surgery service     Heide Scaleshristopher J Tegeler, MD 08/12/16 2000

## 2016-08-12 NOTE — H&P (Signed)
[facility] Consult/Admission Note  Kimberly Castro 30-Sep-1960  [id number].    Requesting MD: Dr. Sherry Ruffing Chief Complaint/Reason for Consult: SBO  HPI:   She is a 56 year old female with a history of morbid obesity, laparoscopic gastric bypass in 2004, HTN, chronic BLE rash, who presented to the [facility] ED with complaints of abdominal pain that started roughly 12:30 last night. Patient states she had some discomfort last night but woke around 2 or 3 in the morning with severe abdominal pain. Pain is in the upper abdomen, waxes and wanes in severity, nonradiating, nothing makes it better, nothing makes it worse. Associated nausea and vomiting. Last episode of vomiting was dark in color. Patient had normal bowel movements 2 days ago. She is not having flatus. Patient denies hematochezia, dysuria, hematuria, fever, chills, chest pain, shortness breath, dizziness, LOC, recent illness, recent travel. Patient is not on anticoagulation therapy. Patient has no history of blood clots. She denies estrogen use.  ED course: VSS Labs: Labs unremarkable CT scan abdomen pelvis: Dilated segment of small bowel in the upper left quadrant with rapid decompression of the small bowel anastomosis, worrisome for stricture related obstruction. An internal hernia cannot be excluded. Mild mesenteric edema can be seen with impending ischemia. Mild bilateral lower lobe peribronchovascular groundglass may be due to aspiration  ROS:  Review of Systems  Constitutional: Negative for chills, diaphoresis and fever.  HENT: Negative for congestion and sore throat.   Eyes: Negative for pain and discharge.  Respiratory: Negative for cough and shortness of breath.   Cardiovascular: Negative for chest pain and palpitations.  Gastrointestinal: Positive for abdominal pain, nausea and vomiting. Negative for blood in stool, constipation, diarrhea and melena.  Genitourinary: Negative for dysuria and hematuria.  Musculoskeletal:  Negative for back pain and falls.  Skin: Positive for rash (chronic BLE).  Neurological: Negative for dizziness, loss of consciousness and headaches.  All other systems reviewed and are negative.    Family History  Problem Relation Age of Onset  . Kidney disease Mother   . Heart failure Mother   . COPD Mother   . Atrial fibrillation Mother   . AAA (abdominal aortic aneurysm) Father   . COPD Father   . Heart disease Father   . Peripheral Artery Disease Brother   . Diabetes Maternal Grandmother   . Heart disease Maternal Grandmother   . Heart disease Maternal Grandfather   . Diabetes Maternal Grandfather   . Diabetes Paternal Grandmother   . Stroke Paternal Grandmother   . Heart disease Paternal Grandfather   . COPD Brother   . COPD Brother   . Stroke Brother   . Diabetes Brother   . CAD Brother     Past Medical History:  Diagnosis Date  . ADD (attention deficit disorder)   . Chronic fatigue   . Depression   . Fibromyalgia   . Hypertension   . Palpitations 03/31/2016  . Sleep apnea     History reviewed. No pertinent surgical history.  Social History:  reports that she has quit smoking. She has never used smokeless tobacco. She reports that she drinks alcohol. She reports that she does not use drugs.  Allergies:  Allergies  Allergen Reactions  . Dye Fdc Red 3 (Erythrosine) Swelling    Iv dye     (Not in a hospital admission)  Blood pressure 143/61, pulse (!) 56, temperature 97.5 F (36.4 C), temperature source Oral, resp. rate 16, height '5\' 6"'  (1.676 m), weight 298 lb (135.2 kg), SpO2  99 %.  Physical Exam  Constitutional: She is oriented to person, place, and time and well-developed, well-nourished, and in no distress. Vital signs are normal. No distress.  Uncomfortable appearing morbidly obese white female, pleasant, in no acute distress  HENT:  Head: Normocephalic and atraumatic.  Nose: Nose normal.  Eyes: Conjunctivae are normal. Right eye exhibits no  discharge. Left eye exhibits no discharge. No scleral icterus.  Neck: Normal range of motion. Neck supple.  Cardiovascular: Normal rate, regular rhythm and intact distal pulses.  Exam reveals no gallop and no friction rub.   Murmur heard.  Systolic murmur is present  Pulses:      Radial pulses are 2+ on the right side, and 2+ on the left side.       Dorsalis pedis pulses are 2+ on the right side, and 2+ on the left side.  Mild systolic murmur noted  Pulmonary/Chest: Effort normal and breath sounds normal. No respiratory distress. She has no decreased breath sounds. She has no wheezes. She has no rhonchi. She has no rales.  Abdominal: Soft. She exhibits no distension and no mass.  Obese, soft, no bowel sounds appreciated, not tympanic, moderately TTP to RUQ, epigastric, LUQ, no rebound or guarding  Musculoskeletal: Normal range of motion. She exhibits no edema or deformity.  Neurological: She is alert and oriented to person, place, and time.  Skin: Skin is warm and dry. Rash noted. She is not diaphoretic.  Circular erythematous plaques noted to BLE, larger ones with central clearing  Psychiatric: Mood and affect normal.  Nursing note and vitals reviewed.   Results for orders placed or performed during the hospital encounter of 08/12/16 (from the past 48 hour(s))  Comprehensive metabolic panel     Status: Abnormal   Collection Time: 08/12/16  4:29 AM  Result Value Ref Range   Sodium 139 135 - 145 mmol/L   Potassium 3.7 3.5 - 5.1 mmol/L   Chloride 99 (L) 101 - 111 mmol/L   CO2 26 22 - 32 mmol/L   Glucose, Bld 101 (H) 65 - 99 mg/dL   BUN 15 6 - 20 mg/dL   Creatinine, Ser 0.90 0.44 - 1.00 mg/dL   Calcium 9.7 8.9 - 10.3 mg/dL   Total Protein 7.2 6.5 - 8.1 g/dL   Albumin 4.0 3.5 - 5.0 g/dL   AST 27 15 - 41 U/L   ALT 29 14 - 54 U/L   Alkaline Phosphatase 87 38 - 126 U/L   Total Bilirubin 0.3 0.3 - 1.2 mg/dL   GFR calc non Af Amer >60 >60 mL/min   GFR calc Af Amer >60 >60 mL/min     Comment: (NOTE) The eGFR has been calculated using the CKD EPI equation. This calculation has not been validated in all clinical situations. eGFR's persistently <60 mL/min signify possible Chronic Kidney Disease.    Anion gap 14 5 - 15  CBC with Differential/Platelet     Status: None   Collection Time: 08/12/16  4:29 AM  Result Value Ref Range   WBC 8.1 4.0 - 10.5 K/uL   RBC 4.29 3.87 - 5.11 MIL/uL   Hemoglobin 12.5 12.0 - 15.0 g/dL   HCT 38.0 36.0 - 46.0 %   MCV 88.6 78.0 - 100.0 fL   MCH 29.1 26.0 - 34.0 pg   MCHC 32.9 30.0 - 36.0 g/dL   RDW 13.3 11.5 - 15.5 %   Platelets 260 150 - 400 K/uL   Neutrophils Relative % 56 %  Neutro Abs 4.6 1.7 - 7.7 K/uL   Lymphocytes Relative 32 %   Lymphs Abs 2.6 0.7 - 4.0 K/uL   Monocytes Relative 10 %   Monocytes Absolute 0.8 0.1 - 1.0 K/uL   Eosinophils Relative 2 %   Eosinophils Absolute 0.2 0.0 - 0.7 K/uL   Basophils Relative 0 %   Basophils Absolute 0.0 0.0 - 0.1 K/uL  Lipase, blood     Status: None   Collection Time: 08/12/16  4:29 AM  Result Value Ref Range   Lipase 24 11 - 51 U/L  Urinalysis, Routine w reflex microscopic     Status: Abnormal   Collection Time: 08/12/16  9:19 AM  Result Value Ref Range   Color, Urine YELLOW YELLOW   APPearance CLEAR CLEAR   Specific Gravity, Urine >1.046 (H) 1.005 - 1.030   pH 7.0 5.0 - 8.0   Glucose, UA NEGATIVE NEGATIVE mg/dL   Hgb urine dipstick NEGATIVE NEGATIVE   Bilirubin Urine NEGATIVE NEGATIVE   Ketones, ur NEGATIVE NEGATIVE mg/dL   Protein, ur NEGATIVE NEGATIVE mg/dL   Nitrite NEGATIVE NEGATIVE   Leukocytes, UA NEGATIVE NEGATIVE   Ct Abdomen Pelvis W Contrast  Result Date: 08/12/2016 CLINICAL DATA:  Epigastric pain with nausea and vomiting. History of gastric bypass in 2017. EXAM: CT ABDOMEN AND PELVIS WITH CONTRAST TECHNIQUE: Multidetector CT imaging of the abdomen and pelvis was performed using the standard protocol following bolus administration of intravenous contrast. CONTRAST:   166m ISOVUE-300 IOPAMIDOL (ISOVUE-300) INJECTION 61% COMPARISON:  None. FINDINGS: Lower chest: Lung show mild peribronchovascular ground-glass in the lower lobes. Heart is enlarged. No pericardial or pleural effusion. Hepatobiliary: Liver and gallbladder are unremarkable. No biliary ductal dilatation. Pancreas: Negative. Spleen: Negative. Adrenals/Urinary Tract: Adrenal glands and kidneys are unremarkable. Ureters are decompressed. Bladder is grossly unremarkable. Stomach/Bowel: Postoperative changes of gastric bypass. Small hiatal hernia. Mild dilatation of a short segment of small bowel in the left upper quadrant which abruptly tapers just proximal to sutures in the small bowel (series 201, image 28). Remainder of the small bowel is decompressed. Appendix and colon are unremarkable. Vascular/Lymphatic: Atherosclerotic calcification of the arterial vasculature without abdominal aortic aneurysm. No pathologically enlarged lymph nodes. Reproductive: Hysterectomy.  No adnexal mass. Other: No free fluid. Mild haziness in the small bowel mesentery in the left upper quadrant, associated with the dilated loop of proximal small bowel. Tiny periumbilical hernia contains fat. Musculoskeletal: No worrisome lytic or sclerotic lesions. IMPRESSION: 1. Dilated segment of small bowel in the left upper quadrant with rapid decompression at the small bowel anastomosis, worrisome for stricture-related obstruction. An internal hernia cannot be excluded. Mild mesenteric edema can be seen with impending ischemia. These results were called by telephone at the time of interpretation on 08/12/2016 at 8:53 am to Dr. CAntony Blackbird, who verbally acknowledged these results. 2. Mild bilateral lower lobe peribronchovascular ground-glass may be due to aspiration. 3.  Aortic atherosclerosis (ICD10-170.0). Electronically Signed   By: MLorin PicketM.D.   On: 08/12/2016 08:56      Assessment/Plan  HTN Chronic bilateral lower extremity  rash  Small bowel obstruction in the setting of gastric bypass 2004 - Concerns for internal hernia - Patient will likely go to the OR today   Patient has been admitted to our service. Thank you for the consult.  JKalman Drape [facility] 08/12/2016, 10:31 AM Pager: [phone]: [phone]-Fri 7:00 am-4:30 pm Sat-Sun 7:00 am-11:30 am

## 2016-08-12 NOTE — Anesthesia Postprocedure Evaluation (Addendum)
Anesthesia Post Note  Patient: [name]  Procedure(s) Performed: Procedure(s) (LRB): LAPAROSCOPY DIAGNOSTIC, POSSIBLE LAPAROTOMY, POSSIBLE BOWEL RESECTION (N/A)  Patient location during evaluation: PACU Anesthesia Type: General Level of consciousness: awake and alert Pain management: pain level controlled Vital Signs Assessment: post-procedure vital signs reviewed and stable Respiratory status: spontaneous breathing, nonlabored ventilation, respiratory function stable and patient connected to nasal cannula oxygen Cardiovascular status: blood pressure returned to baseline and stable Postop Assessment: no signs of nausea or vomiting Anesthetic complications: no       Last Vitals:  Vitals:   08/12/16 1900 08/12/16 1915  BP: 120/64 119/63  Pulse: 96 93  Resp: (!) 21 19  Temp:      Last Pain:  Vitals:   08/12/16 1745  TempSrc:   PainSc: 2                  Kimberly Castro

## 2016-08-12 NOTE — Progress Notes (Signed)
Patient ID: Kimberly Castro, female   DOB: 1961/02/19, 56 y.o.   MRN: [id number]   Patient has a history of [place] [facility] [place]. She unfortunately has had weight regain but has not had any other complications related to her surgery. She presents with less than 24 hours of acute severe upper abdominal pain with nausea and vomiting. I've reviewed her imaging that shows a dilated loop of small bowel in the left upper quadrant consistent with obstruction from possible internal hernia. Exam significant only for mild upper abdominal tenderness. I discussed the situation with the patient and her fianc. She will require emergency laparoscopy for small bowel obstruction and possible internal hernia. We discussed other possible diagnoses. We discussed possible need for open surgery or bowel resection depending on findings. We discussed risks of general anesthesia, bleeding, infection. All her questions were answered and she agrees to proceed.  Mariella SaaBenjamin T Saidi Santacroce MD, FACS  08/12/2016, 2:04 PM

## 2016-08-12 NOTE — ED Notes (Signed)
Pt returns from ct scan. 

## 2016-08-12 NOTE — Anesthesia Preprocedure Evaluation (Signed)
Anesthesia Evaluation  Patient identified by MRN, date of birth, ID band Patient awake    Reviewed: Allergy & Precautions, NPO status , Patient's Chart, lab work & pertinent test results  History of Anesthesia Complications Negative for: history of anesthetic complications  Airway Mallampati: III  TM Distance: >3 FB Neck ROM: Full    Dental  (+) Teeth Intact   Pulmonary sleep apnea , former smoker,    breath sounds clear to auscultation       Cardiovascular hypertension, Pt. on medications  Rhythm:Regular     Neuro/Psych PSYCHIATRIC DISORDERS Anxiety Depression negative neurological ROS     GI/Hepatic Neg liver ROS, GERD  ,  Endo/Other  Morbid obesity  Renal/GU negative Renal ROS     Musculoskeletal  (+) Fibromyalgia -  Abdominal   Peds  Hematology negative hematology ROS (+)   Anesthesia Other Findings   Reproductive/Obstetrics                             Anesthesia Physical Anesthesia Plan  ASA: III  Anesthesia Plan: General   Post-op Pain Management:    Induction: Intravenous, Rapid sequence and Cricoid pressure planned  Airway Management Planned: Oral ETT  Additional Equipment: None  Intra-op Plan:   Post-operative Plan: Extubation in OR and Possible Post-op intubation/ventilation  Informed Consent: I have reviewed the patients History and Physical, chart, labs and discussed the procedure including the risks, benefits and alternatives for the proposed anesthesia with the patient or authorized representative who has indicated his/her understanding and acceptance.   Dental advisory given  Plan Discussed with: CRNA and Surgeon  Anesthesia Plan Comments:         Anesthesia Quick Evaluation

## 2016-08-12 NOTE — Progress Notes (Addendum)
Pt arrived to 6n3 from ED at this time, family at bedside,oriented to room and surroundings, c/o nausea, abd pain 8/10.

## 2016-08-12 NOTE — ED Triage Notes (Signed)
Pt arrived via POV from home, c/o abdominal pain for the last few hours.  Pain is mid center abdomen described as cutting.

## 2016-08-12 NOTE — ED Notes (Signed)
Dr. Blackman at bedside. 

## 2016-08-12 NOTE — Anesthesia Preprocedure Evaluation (Signed)
Anesthesia Evaluation    Airway       Dental   Pulmonary former smoker,          Cardiovascular hypertension,     Neuro/Psych    GI/Hepatic   Endo/Other    Renal/GU      Musculoskeletal   Abdominal   Peds  Hematology   Anesthesia Other Findings   Reproductive/Obstetrics                          Anesthesia Physical Anesthesia Plan Anesthesia Quick Evaluation  

## 2016-08-12 NOTE — Op Note (Signed)
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 She presents with acute severe upper abdominal pain and vomiting. CT scan shows obstructed dilated loops of small bowel in the left upper quadrant of concern for an internal hernia. I have recommended emergency laparoscopy and possible laparotomy for bowel obstruction with likely internal hernia. We discussed the nature of the problem and surgery and risks detailed elsewhere and she agrees.    Procedure Detail:  Patient was brought to the operating room, placed in the supine position on the operating table, and general endotracheal anesthesia induced. She was given preoperative IV antibiotics. PAS were placed. The abdomen was widely sterilely prepped and draped. Patient timeout was performed and correct procedure verified. Access was obtained with a 5 mm Optiview trocar in the left upper quadrant without difficulty and pneumoperitoneum established. Laparoscopy showed some markedly dilated hemorrhagic loops of small bowel in the left upper quadrant. Initially I identified the ileocecal valve and normal decompressed ileum was traced proximally. I continued until the bowel was tethered down into a tight posterior mesenteric defect in the upper abdomen. The omentum could not be completely elevated D Hsu some adhesions in the left lower quadrant and these were taken down with the Harmonic scalpel and then the transverse colon could be elevated and it was clear that this was a defect through a retrocolic  mesenteric routed Roux limb. I exposed this defect and began to carefully reduce the herniated bowel through the defect inferiorly. Initially this went well although required a fair amount of tension. The jejunojejunostomy was pulled down through the defect. I continued to reduce the Roux limb but the more distended proximal loops would not come through easily. At this point I had the anesthesiologist place an nasogastric tube into the gastric pouch which I can't identify and this was decompressed and I was able to decompress the remainder of the Roux limb via the NG tube and then couldn't completely reduce the remainder of the Roux limb down to inferior to the transverse mesocolon. He confirmed that there was no further small bowel above the mesocolon. The area that had been hemorrhagic pinked up nicely. We carefully inspected the small bowel. There was one very minimal superficial serosal tear that did not require any attention. The bowel was healthy. The hernia was then repaired using interrupted 2-0 silk circumferentially suturing the bowel to the edges of the transverse colon mesenteric defect. This appeared secure. The operative site and abdomen were again carefully inspected and there was no bleeding or injury or other problems seen. All CO2 was evacuated and trochars removed. Skin incisions were closed with subcuticular Monocryl. Needle and instrument counts were correct.    Findings: As above  Estimated Blood Loss:  Minimal         Drains: None  Blood Given: none          Specimens: None        Complications:  * No complications entered in OR log *         Disposition: PACU - hemodynamically stable.         Condition: stable

## 2016-08-12 NOTE — Progress Notes (Signed)
Patient given phenergan for nausea. Patient now resting easily arousable.

## 2016-08-12 NOTE — ED Notes (Addendum)
NG placement attempted x2 unsuccessfully.  Patient very anxious.  Patient refuses further NG attempts.  Attending paged.

## 2016-08-12 NOTE — Progress Notes (Signed)
Pt on full mask CPAP - ok to trx back to 6N per Dr Maple HudsonMoser if pt remains on cont pulse oximetry

## 2016-08-12 NOTE — ED Provider Notes (Signed)
[facility]EMER[facility]ENCY DEPT Provider Note   CSN: [id number] Arrival date & time: 08/12/16  0400     History   Chief Complaint No chief complaint on file.   HPI Kimberly Castro is a 56 y.o. female.  The history is provided by the patient and a significant other. The history is limited by the condition of the patient.  Abdominal Pain   This is a new problem. Episode onset: just prior to arrival. The problem occurs constantly. The problem has been rapidly worsening. The pain is associated with eating. The pain is located in the epigastric region. The pain is severe. Associated symptoms include nausea and vomiting. Pertinent negatives include fever and diarrhea. The symptoms are aggravated by certain positions and palpation. Nothing relieves the symptoms. Past workup comments: gastric bypass.  pt reports mild abdominal pain earlier in the evening after eating dinner She report she went to bed then had abrupt onset of epigastric pain She reports nausea/vomiting   Past Medical History:  Diagnosis Date  . ADD (attention deficit disorder)   . Chronic fatigue   . Depression   . Fibromyalgia   . Palpitations 03/31/2016  . Sleep apnea     Patient Active Problem List   Diagnosis Date Noted  . Essential hypertension 03/31/2016  . Fibromyalgia 03/31/2016  . Morbid obesity due to excess calories (HCC) 03/31/2016  . Obstructive sleep apnea syndrome 03/31/2016  . S/P gastric bypass 03/31/2016  . Palpitations 03/31/2016    No past surgical history on file.  OB History    No data available       Home Medications    Prior to Admission medications   Medication Sig Start Date End Date Taking? Authorizing Provider  amphetamine-dextroamphetamine (ADDERALL XR) 30 MG 24 hr capsule 1    Historical Provider, MD  clonazePAM (KLONOPIN) 0.5 MG tablet Take 0.5 mg by mouth 2 (two) times daily as needed.    Historical Provider, MD  Cyanocobalamin (GNP VITAMIN B-12) 1000 MCG TBCR 1 tablet 09/30/15    Historical Provider, MD  DULoxetine (CYMBALTA) 30 MG capsule Take 30 mg by mouth daily. 10/28/15   Historical Provider, MD  fluticasone (FLONASE) 50 MCG/ACT nasal spray Place 1 spray into both nostrils as needed. 02/25/16   Historical Provider, MD  HYDROcodone-acetaminophen (NORCO) 7.5-325 MG tablet Take 1 tablet by mouth as needed. 03/08/16   Historical Provider, MD  olmesartan-hydrochlorothiazide (BENICAR HCT) 40-12.5 MG tablet Take 1 tablet by mouth daily.    Historical Provider, MD  olmesartan-hydrochlorothiazide (BENICAR HCT) 40-25 MG tablet Take 1 tablet by mouth daily. 03/11/16   Historical Provider, MD  predniSONE (DELTASONE) 20 MG tablet Take 20 mg by mouth daily. 03/08/16   Historical Provider, MD  rosuvastatin (CRESTOR) 20 MG tablet Take 1 tablet (20 mg total) by mouth daily. 03/31/16 06/29/16  Chilton Si, MD  valACYclovir (VALTREX) 1000 MG tablet Take 1 tablet by mouth daily.    Historical Provider, MD  venlafaxine XR (EFFEXOR-XR) 150 MG 24 hr capsule Take 150 mg by mouth daily. 01/23/16   Historical Provider, MD  zolpidem (AMBIEN) 10 MG tablet TK 1 T PO HS PRN 03/27/16   Historical Provider, MD    Family History Family History  Problem Relation Age of Onset  . Kidney disease Mother   . Heart failure Mother   . COPD Mother   . Atrial fibrillation Mother   . AAA (abdominal aortic aneurysm) Father   . COPD Father   . Heart disease Father   . Peripheral Artery  Disease Brother   . Diabetes Maternal Grandmother   . Heart disease Maternal Grandmother   . Heart disease Maternal Grandfather   . Diabetes Maternal Grandfather   . Diabetes Paternal Grandmother   . Stroke Paternal Grandmother   . Heart disease Paternal Grandfather   . COPD Brother   . COPD Brother   . Stroke Brother   . Diabetes Brother   . CAD Brother     Social History Social History  Substance Use Topics  . Smoking status: Former Games developermoker  . Smokeless tobacco: Not on file  . Alcohol use Yes     Comment: very  rare     Allergies   Dye fdc red 3 (erythrosine)   Review of Systems Review of Systems  Constitutional: Negative for fever.  Cardiovascular: Negative for chest pain.  Gastrointestinal: Positive for abdominal pain, nausea and vomiting. Negative for diarrhea.  All other systems reviewed and are negative.    Physical Exam Updated Vital Signs BP 151/57 (BP Location: Right Arm)   Pulse 60   Temp 97.5 F (36.4 C) (Oral)   Resp 18   SpO2 100%   Physical Exam CONSTITUTIONAL: Well developed/well nourished, tearful, anxious HEAD: Normocephalic/atraumatic EYES: EOMI/PERRL, no icterus ENMT: Mucous membranes moist NECK: supple no meningeal signs SPINE/BACK:entire spine nontender CV: S1/S2 noted, no murmurs/rubs/gallops noted LUNGS: Lungs are clear to auscultation bilaterally, no apparent distress ABDOMEN: obese.  Soft.  Diffuse moderate tenderness.  Bowel sounds noted throughout.  No rebound.   GU:no cva tenderness NEURO: Pt is awake/alert/appropriate, moves all extremitiesx4.  No facial droop.   EXTREMITIES: pulses normal/equal, full ROM SKIN: warm, color normal PSYCH: anxious and tearful  ED Treatments / Results  Labs (all labs ordered are listed, but only abnormal results are displayed) Labs Reviewed  COMPREHENSIVE METABOLIC PANEL - Abnormal; Notable for the following:       Result Value   Chloride 99 (*)    Glucose, Bld 101 (*)    All other components within normal limits  CBC WITH DIFFERENTIAL/PLATELET  LIPASE, BLOOD  URINALYSIS, ROUTINE W REFLEX MICROSCOPIC    EKG  EKG Interpretation  Date/Time:  Saturday August 12 2016 05:14:36 EST Ventricular Rate:  54 PR Interval:    QRS Duration: 102 QT Interval:  414 QTC Calculation: 393 R Axis:   -19 Text Interpretation:  Sinus rhythm Borderline left axis deviation Low voltage, precordial leads No previous ECGs available Confirmed by Bebe ShaggyWICKLINE  MD, Dorinda HillNALD ([id number]) on 08/12/2016 5:18:29 AM       Radiology No  results found.  Procedures Procedures   Medications Ordered in ED Medications  fentaNYL (SUBLIMAZE) injection 100 mcg (100 mcg Intravenous Given 08/12/16 0437)  ondansetron (ZOFRAN) injection 4 mg (4 mg Intravenous Given 08/12/16 0437)  fentaNYL (SUBLIMAZE) injection 100 mcg (100 mcg Intravenous Given 08/12/16 0630)  ondansetron (ZOFRAN) injection 4 mg (4 mg Intravenous Given 08/12/16 0630)  iopamidol (ISOVUE-300) 61 % injection (100 mLs  Contrast Given 08/12/16 0747)     Initial Impression / Assessment and Plan / ED Course  I have reviewed the triage vital signs and the nursing notes.  Pertinent labs & imaging results that were available during my care of the patient were reviewed by me and considered in my medical decision making (see chart for details).     5:41 AM Pt with h/o gastric bypass presents with acute abdominal pain She appears very uncomfortable Will need CT imaging She is currently hemodynamically appropriate Workup pending at this time 7:59 AM CT  pending at this time Pt with h/o gastric bypass several yrs ago ([place]) now with acute abdominal pain At signout to Dr Rush Landmark, f/u on CT imaging  Final Clinical Impressions(s) / ED Diagnoses   Final diagnoses:  None    New Prescriptions New Prescriptions   No medications on file     Zadie Rhine, MD 08/12/16 [phone]

## 2016-08-12 NOTE — ED Notes (Signed)
mmambulated tp too restroom no issues. Pt back in room family at bedside. Pain has become more intense.

## 2016-08-12 NOTE — Transfer of Care (Signed)
Immediate Anesthesia Transfer of Care Note  Patient: [name]  Procedure(s) Performed: Procedure(s): LAPAROSCOPY DIAGNOSTIC, POSSIBLE LAPAROTOMY, POSSIBLE BOWEL RESECTION (N/A)  Patient Location: PACU  Anesthesia Type:General  Level of Consciousness: awake  Airway & Oxygen Therapy: Patient Spontanous Breathing and Patient connected to face mask oxygen  Post-op Assessment: Report given to RN and Post -op Vital signs reviewed and stable  Post vital signs: Reviewed and stable  Last Vitals:  Vitals:   08/12/16 1115 08/12/16 1331  BP: 172/74 (!) 154/63  Pulse: (!) 58 (!) 56  Resp: 18 18  Temp:  36.6 C    Last Pain:  Vitals:   08/12/16 1331  TempSrc: Oral  PainSc:          Complications: No apparent anesthesia complications

## 2016-08-12 NOTE — Progress Notes (Signed)
Pt to OR at this time.

## 2016-08-13 ENCOUNTER — Encounter (HOSPITAL_COMMUNITY): Payer: Self-pay | Admitting: General Surgery

## 2016-08-13 DIAGNOSIS — R51 Headache: Secondary | ICD-10-CM | POA: Diagnosis not present

## 2016-08-13 DIAGNOSIS — Z7951 Long term (current) use of inhaled steroids: Secondary | ICD-10-CM | POA: Diagnosis not present

## 2016-08-13 DIAGNOSIS — B001 Herpesviral vesicular dermatitis: Secondary | ICD-10-CM | POA: Diagnosis not present

## 2016-08-13 DIAGNOSIS — Z825 Family history of asthma and other chronic lower respiratory diseases: Secondary | ICD-10-CM | POA: Diagnosis not present

## 2016-08-13 DIAGNOSIS — Z87891 Personal history of nicotine dependence: Secondary | ICD-10-CM | POA: Diagnosis not present

## 2016-08-13 DIAGNOSIS — M797 Fibromyalgia: Secondary | ICD-10-CM | POA: Diagnosis present

## 2016-08-13 DIAGNOSIS — Z9884 Bariatric surgery status: Secondary | ICD-10-CM | POA: Diagnosis not present

## 2016-08-13 DIAGNOSIS — R1013 Epigastric pain: Secondary | ICD-10-CM | POA: Diagnosis present

## 2016-08-13 DIAGNOSIS — Z6841 Body Mass Index (BMI) 40.0 and over, adult: Secondary | ICD-10-CM | POA: Diagnosis not present

## 2016-08-13 DIAGNOSIS — E662 Morbid (severe) obesity with alveolar hypoventilation: Secondary | ICD-10-CM | POA: Diagnosis present

## 2016-08-13 DIAGNOSIS — R0981 Nasal congestion: Secondary | ICD-10-CM | POA: Diagnosis not present

## 2016-08-13 DIAGNOSIS — F988 Other specified behavioral and emotional disorders with onset usually occurring in childhood and adolescence: Secondary | ICD-10-CM | POA: Diagnosis present

## 2016-08-13 DIAGNOSIS — Z91048 Other nonmedicinal substance allergy status: Secondary | ICD-10-CM | POA: Diagnosis not present

## 2016-08-13 DIAGNOSIS — Z8249 Family history of ischemic heart disease and other diseases of the circulatory system: Secondary | ICD-10-CM | POA: Diagnosis not present

## 2016-08-13 DIAGNOSIS — F329 Major depressive disorder, single episode, unspecified: Secondary | ICD-10-CM | POA: Diagnosis present

## 2016-08-13 DIAGNOSIS — Z833 Family history of diabetes mellitus: Secondary | ICD-10-CM | POA: Diagnosis not present

## 2016-08-13 DIAGNOSIS — Z841 Family history of disorders of kidney and ureter: Secondary | ICD-10-CM | POA: Diagnosis not present

## 2016-08-13 DIAGNOSIS — K219 Gastro-esophageal reflux disease without esophagitis: Secondary | ICD-10-CM | POA: Diagnosis present

## 2016-08-13 DIAGNOSIS — I10 Essential (primary) hypertension: Secondary | ICD-10-CM | POA: Diagnosis present

## 2016-08-13 DIAGNOSIS — Z91041 Radiographic dye allergy status: Secondary | ICD-10-CM | POA: Diagnosis not present

## 2016-08-13 DIAGNOSIS — K460 Unspecified abdominal hernia with obstruction, without gangrene: Secondary | ICD-10-CM | POA: Diagnosis present

## 2016-08-13 DIAGNOSIS — R05 Cough: Secondary | ICD-10-CM | POA: Diagnosis not present

## 2016-08-13 DIAGNOSIS — Z823 Family history of stroke: Secondary | ICD-10-CM | POA: Diagnosis not present

## 2016-08-13 DIAGNOSIS — R21 Rash and other nonspecific skin eruption: Secondary | ICD-10-CM | POA: Diagnosis present

## 2016-08-13 LAB — CBC
HCT: 32.9 % — ABNORMAL LOW (ref 36.0–46.0)
Hemoglobin: 10.5 g/dL — ABNORMAL LOW (ref 12.0–15.0)
MCH: 28.5 pg (ref 26.0–34.0)
MCHC: 31.9 g/dL (ref 30.0–36.0)
MCV: 89.2 fL (ref 78.0–100.0)
Platelets: 244 10*3/uL (ref 150–400)
RBC: 3.69 MIL/uL — ABNORMAL LOW (ref 3.87–5.11)
RDW: 13.6 % (ref 11.5–15.5)
WBC: 12.8 10*3/uL — ABNORMAL HIGH (ref 4.0–10.5)

## 2016-08-13 LAB — HIV ANTIBODY (ROUTINE TESTING W REFLEX): HIV Screen 4th Generation wRfx: NONREACTIVE

## 2016-08-13 LAB — COMPREHENSIVE METABOLIC PANEL
ALT: 20 U/L (ref 14–54)
AST: 27 U/L (ref 15–41)
Albumin: 3.1 g/dL — ABNORMAL LOW (ref 3.5–5.0)
Alkaline Phosphatase: 58 U/L (ref 38–126)
Anion gap: 7 (ref 5–15)
BUN: 12 mg/dL (ref 6–20)
CO2: 28 mmol/L (ref 22–32)
Calcium: 8.3 mg/dL — ABNORMAL LOW (ref 8.9–10.3)
Chloride: 104 mmol/L (ref 101–111)
Creatinine, Ser: 0.9 mg/dL (ref 0.44–1.00)
GFR calc Af Amer: >60 mL/min (ref >60)
GFR calc non Af Amer: >60 mL/min (ref >60)
Glucose, Bld: 111 mg/dL — ABNORMAL HIGH (ref 65–99)
Potassium: 4.1 mmol/L (ref 3.5–5.1)
Sodium: 139 mmol/L (ref 135–145)
Total Bilirubin: 0.5 mg/dL (ref 0.3–1.2)
Total Protein: 5.7 g/dL — ABNORMAL LOW (ref 6.5–8.1)

## 2016-08-13 MED ORDER — HYDROMORPHONE HCL 2 MG/ML IJ SOLN
0.5000 mg | INTRAMUSCULAR | Status: DC | PRN
Start: 2016-08-13 — End: 2016-08-15
  Administered 2016-08-13 – 2016-08-15 (×5): 1 mg via INTRAVENOUS
  Filled 2016-08-13 (×5): qty 1

## 2016-08-13 MED ORDER — ACETAMINOPHEN 160 MG/5ML PO SOLN
650.0000 mg | Freq: Four times a day (QID) | ORAL | Status: DC | PRN
  Administered 2016-08-13 – 2016-08-15 (×5): 650 mg via ORAL
  Filled 2016-08-13 (×5): qty 20.3

## 2016-08-13 NOTE — Progress Notes (Signed)
[facility] Progress Note 1 Day Post-Op  Subjective: Patient complaining of a bad headache  Objective: Vital signs in last 24 hours: Temp:  [97 F (36.1 C)-98.6 F (37 C)] 98.6 F (37 C) (02/25 0427) Pulse Rate:  [56-108] 87 (02/25 0427) Resp:  [17-23] 19 (02/25 0427) BP: (101-154)/(25-73) 121/54 (02/25 0427) SpO2:  [80 %-100 %] 92 % (02/25 0427) Weight:  [137 kg (302 lb)] 137 kg (302 lb) (02/24 1331) Last BM Date: 08/10/16  Intake/Output from previous day: 02/24 0701 - 02/25 0700 In: 4956 [I.V.:4206; IV Piggyback:750] Out: 1550 [Urine:1500; Blood:50] Intake/Output this shift: Total I/O In: -  Out: 300 [Urine:300]  General: Headache.  Seems very uncomfortable.  Abdomen is better.  Lungs: Clear  Abd: Soft, good bowel sounds.  Extremities: No clinical signs or symptoms of DVT  Neuro: Intact  Lab Results:  @LABLAST2 (wbc:2,hgb:2,hct:2,plt:2) BMET ) Recent Labs  08/12/16 0429 08/12/16 1035 08/13/16 0511  NA 139  --  139  K 3.7  --  4.1  CL 99*  --  104  CO2 26  --  28  GLUCOSE 101*  --  111*  BUN 15  --  12  CREATININE 0.90 0.93 0.90  CALCIUM 9.7  --  8.3*   PT/INR No results for input(s): LABPROT, INR in the last 72 hours. ABG No results for input(s): PHART, HCO3 in the last 72 hours.  Invalid input(s): PCO2, PO2  Studies/Results: Ct Abdomen Pelvis W Contrast  Result Date: 08/12/2016 CLINICAL DATA:  Epigastric pain with nausea and vomiting. History of gastric bypass in 2017. EXAM: CT ABDOMEN AND PELVIS WITH CONTRAST TECHNIQUE: Multidetector CT imaging of the abdomen and pelvis was performed using the standard protocol following bolus administration of intravenous contrast. CONTRAST:  100mL ISOVUE-300 IOPAMIDOL (ISOVUE-300) INJECTION 61% COMPARISON:  None. FINDINGS: Lower chest: Lung show mild peribronchovascular ground-glass in the lower lobes. Heart is enlarged. No pericardial or pleural effusion. Hepatobiliary: Liver and gallbladder are unremarkable. No  biliary ductal dilatation. Pancreas: Negative. Spleen: Negative. Adrenals/Urinary Tract: Adrenal glands and kidneys are unremarkable. Ureters are decompressed. Bladder is grossly unremarkable. Stomach/Bowel: Postoperative changes of gastric bypass. Small hiatal hernia. Mild dilatation of a short segment of small bowel in the left upper quadrant which abruptly tapers just proximal to sutures in the small bowel (series 201, image 28). Remainder of the small bowel is decompressed. Appendix and colon are unremarkable. Vascular/Lymphatic: Atherosclerotic calcification of the arterial vasculature without abdominal aortic aneurysm. No pathologically enlarged lymph nodes. Reproductive: Hysterectomy.  No adnexal mass. Other: No free fluid. Mild haziness in the small bowel mesentery in the left upper quadrant, associated with the dilated loop of proximal small bowel. Tiny periumbilical hernia contains fat. Musculoskeletal: No worrisome lytic or sclerotic lesions. IMPRESSION: 1. Dilated segment of small bowel in the left upper quadrant with rapid decompression at the small bowel anastomosis, worrisome for stricture-related obstruction. An internal hernia cannot be excluded. Mild mesenteric edema can be seen with impending ischemia. These results were called by telephone at the time of interpretation on 08/12/2016 at 8:53 am to Dr. Theda BelfastHRIS TEGELER , who verbally acknowledged these results. 2. Mild bilateral lower lobe peribronchovascular ground-glass may be due to aspiration. 3.  Aortic atherosclerosis (ICD10-170.0). Electronically Signed   By: Leanna BattlesMelinda  Blietz M.D.   On: 08/12/2016 08:56    Anti-infectives: Anti-infectives    Start     Dose/Rate Route Frequency Ordered Stop   08/12/16 1400  ceFAZolin (ANCEF) powder 3 g  Status:  Discontinued     3  g Other To Surgery 08/12/16 1346 08/12/16 1349   08/12/16 1400  ceFAZolin (ANCEF) 3 g in dextrose 5 % 50 mL IVPB     3 g 130 mL/hr over 30 Minutes Intravenous  Once 08/12/16  1349        Assessment/Plan: s/p Procedure(s): LAPAROSCOPY DIAGNOSTIC, POSSIBLE LAPAROTOMY, POSSIBLE BOWEL RESECTION POD #1  Not ready for diet yet. Tylenol for headache  LOS: 0 days   Marta Lamas. Gae Bon, MD, FACS [phone] [facility] Surgery 08/13/2016

## 2016-08-13 NOTE — Progress Notes (Signed)
Pt found on CPAP and tolerating well at this time.  RT to monitor and assess as needed.

## 2016-08-13 NOTE — Progress Notes (Signed)
Pt. Does not want morphine b/c she thinks it could be causing her headaches. Paged MD (PA Shanda BumpsJessica) and told her.

## 2016-08-14 MED ORDER — IBUPROFEN 400 MG PO TABS
400.0000 mg | ORAL_TABLET | Freq: Once | ORAL | Status: AC
  Administered 2016-08-14: 400 mg via ORAL
  Filled 2016-08-14: qty 1

## 2016-08-14 NOTE — Progress Notes (Signed)
Patient places self on and off of CPAP when ready. Patient has placed self on and is tolerating well at this time.

## 2016-08-14 NOTE — Progress Notes (Signed)
Pt ambulated a third of a lap on unit today . Pt tolerated walk with no difficulty noted

## 2016-08-14 NOTE — Progress Notes (Signed)
Patient ID: Kimberly Castro, female   DOB: 1961-04-17, 56 y.o.   MRN: [id number]  [facility]  2 Days Post-Op  Subjective: Persistent headache, patient thinks that it is because she hasn't had her normal daily dose of caffeine. Abdominal pain is less today. States that she is passing a little flatus. No BM. Ambulated in hall once yesterday.  Objective: Vital signs in last 24 hours: Temp:  [99 F (37.2 C)-100 F (37.8 C)] 99 F (37.2 C) (02/26 0400) Pulse Rate:  [81-101] 101 (02/26 0400) Resp:  [18] 18 (02/26 0400) BP: (92-126)/(43-66) 115/66 (02/26 0400) SpO2:  [92 %-96 %] 93 % (02/26 0400) Last BM Date: 08/10/16  Intake/Output from previous day: 02/25 0701 - 02/26 0700 In: 3882 [I.V.:3882] Out: 500 [Urine:500] Intake/Output this shift: No intake/output data recorded.  PE: Gen:  Alert, NAD, pleasant Card:  RRR, no M/G/R heard Pulm:  CTAB, no W/R/R, effort normal Abd: Soft, obese, NT/ND, +BS, multiple well healing lap incisions C/D/I  Lab Results:   Recent Labs  08/12/16 1035 08/13/16 0511  WBC 8.9 12.8*  HGB 12.7 10.5*  HCT 38.6 32.9*  PLT 286 244   BMET  Recent Labs  08/12/16 0429 08/12/16 1035 08/13/16 0511  NA 139  --  139  K 3.7  --  4.1  CL 99*  --  104  CO2 26  --  28  GLUCOSE 101*  --  111*  BUN 15  --  12  CREATININE 0.90 0.93 0.90  CALCIUM 9.7  --  8.3*   PT/INR No results for input(s): LABPROT, INR in the last 72 hours. CMP     Component Value Date/Time   NA 139 08/13/2016 0511   K 4.1 08/13/2016 0511   CL 104 08/13/2016 0511   CO2 28 08/13/2016 0511   GLUCOSE 111 (H) 08/13/2016 0511   BUN 12 08/13/2016 0511   CREATININE 0.90 08/13/2016 0511   CALCIUM 8.3 (L) 08/13/2016 0511   PROT 5.7 (L) 08/13/2016 0511   ALBUMIN 3.1 (L) 08/13/2016 0511   AST 27 08/13/2016 0511   ALT 20 08/13/2016 0511   ALKPHOS 58 08/13/2016 0511   BILITOT 0.5 08/13/2016 0511   GFRNONAA >60 08/13/2016 0511   GFRAA >60 08/13/2016 0511    Lipase     Component Value Date/Time   LIPASE 24 08/12/2016 0429       Studies/Results: No results found.  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 Johna SheriffHoxworth - POD 2 - +flatus  Headache - tylenol PRN H/o laparoscopic gastric bypass in 2004 HTN - stbale Chronic bilateral lower extremity rash  ID - ancef 2/24>> VTE - lovenox, SCDs FEN - IVF, clears  Plan - advance to clear liquids. Encourage more ambulation today.   LOS: 1 day    Edson SnowballBROOKE A MILLER , [facility] 08/14/2016, 8:29 AM Pager: [phone] Consults: [phone] Mon-Fri 7:00 am-4:30 pm Sat-Sun 7:00 am-11:30 am

## 2016-08-15 LAB — BASIC METABOLIC PANEL
Anion gap: 11 (ref 5–15)
BUN: 13 mg/dL (ref 6–20)
CO2: 25 mmol/L (ref 22–32)
Calcium: 9 mg/dL (ref 8.9–10.3)
Chloride: 104 mmol/L (ref 101–111)
Creatinine, Ser: 0.95 mg/dL (ref 0.44–1.00)
GFR calc Af Amer: >60 mL/min (ref >60)
GFR calc non Af Amer: >60 mL/min (ref >60)
Glucose, Bld: 130 mg/dL — ABNORMAL HIGH (ref 65–99)
Potassium: 3.6 mmol/L (ref 3.5–5.1)
Sodium: 140 mmol/L (ref 135–145)

## 2016-08-15 LAB — CBC
HCT: 32.9 % — ABNORMAL LOW (ref 36.0–46.0)
Hemoglobin: 10.9 g/dL — ABNORMAL LOW (ref 12.0–15.0)
MCH: 29.5 pg (ref 26.0–34.0)
MCHC: 33.1 g/dL (ref 30.0–36.0)
MCV: 89.2 fL (ref 78.0–100.0)
Platelets: 243 10*3/uL (ref 150–400)
RBC: 3.69 MIL/uL — ABNORMAL LOW (ref 3.87–5.11)
RDW: 14 % (ref 11.5–15.5)
WBC: 9.2 10*3/uL (ref 4.0–10.5)

## 2016-08-15 MED ORDER — OXYCODONE HCL 5 MG PO TABS
5.0000 mg | ORAL_TABLET | ORAL | Status: DC | PRN
  Administered 2016-08-15 – 2016-08-16 (×2): 5 mg via ORAL
  Filled 2016-08-15 (×2): qty 1

## 2016-08-15 MED ORDER — VENLAFAXINE HCL ER 75 MG PO CP24
150.0000 mg | ORAL_CAPSULE | Freq: Every day | ORAL | Status: DC
  Administered 2016-08-15 – 2016-08-16 (×2): 150 mg via ORAL
  Filled 2016-08-15 (×2): qty 2

## 2016-08-15 MED ORDER — PANTOPRAZOLE SODIUM 40 MG PO TBEC
40.0000 mg | DELAYED_RELEASE_TABLET | Freq: Every day | ORAL | Status: DC
  Administered 2016-08-15: 40 mg via ORAL
  Filled 2016-08-15: qty 1

## 2016-08-15 MED ORDER — HYDROMORPHONE HCL 2 MG/ML IJ SOLN: 0.5000 mg | INTRAMUSCULAR | Status: DC | PRN

## 2016-08-15 NOTE — Progress Notes (Signed)
[facility] Progress Note  3 Days Post-Op  Subjective: Pt is tolerating her diet. Having flatus. No nausea, vomiting, fever or chills overnight. Had a few BM's yesterday. No blood in her stools.   Objective: Vital signs in last 24 hours: Temp:  [98.3 F (36.8 C)-98.5 F (36.9 C)] 98.3 F (36.8 C) (02/27 0432) Pulse Rate:  [88-94] 88 (02/27 0432) Resp:  [17-19] 18 (02/27 0432) BP: (115-141)/(51-78) 139/78 (02/27 0432) SpO2:  [91 %-94 %] 91 % (02/27 0432) Last BM Date: 08/14/16  Intake/Output from previous day: 02/26 0701 - 02/27 0700 In: 2316 [P.O.:580; I.V.:1736] Out: 225 [Urine:225] Intake/Output this shift: No intake/output data recorded.  PE: Gen:  Alert, NAD, pleasant, cooperative, well appearing Card:  RRR, mild systolic murmur noted Pulm:  Rate and effort normal Abd: Soft, nondistended, +BS incisions appear well healing, mild generalized TTP Skin: no rashes noted, warm and dry  Lab Results:   Recent Labs  08/12/16 1035 08/13/16 0511  WBC 8.9 12.8*  HGB 12.7 10.5*  HCT 38.6 32.9*  PLT 286 244   BMET  Recent Labs  08/12/16 1035 08/13/16 0511  NA  --  139  K  --  4.1  CL  --  104  CO2  --  28  GLUCOSE  --  111*  BUN  --  12  CREATININE 0.93 0.90  CALCIUM  --  8.3*   PT/INR No results for input(s): LABPROT, INR in the last 72 hours. CMP     Component Value Date/Time   NA 139 08/13/2016 0511   K 4.1 08/13/2016 0511   CL 104 08/13/2016 0511   CO2 28 08/13/2016 0511   GLUCOSE 111 (H) 08/13/2016 0511   BUN 12 08/13/2016 0511   CREATININE 0.90 08/13/2016 0511   CALCIUM 8.3 (L) 08/13/2016 0511   PROT 5.7 (L) 08/13/2016 0511   ALBUMIN 3.1 (L) 08/13/2016 0511   AST 27 08/13/2016 0511   ALT 20 08/13/2016 0511   ALKPHOS 58 08/13/2016 0511   BILITOT 0.5 08/13/2016 0511   GFRNONAA >60 08/13/2016 0511   GFRAA >60 08/13/2016 0511   Lipase     Component Value Date/Time   LIPASE 24 08/12/2016 0429       Studies/Results: No  results found.  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 Johna SheriffHoxworth - POD 2 - +flatus  Headache - tylenol PRN H/o laparoscopic gastric bypass in 2004 HTN - stbale Chronic bilateral lower extremity rash  ID - ancef 2/24>> VTE - lovenox, SCDs FEN - IVF, fulls  Plan - advance to full liquids, if she tolerates she can be advanced to soft for dinner. Encourage ambulation. Restarted home Effexor. If tolerates diet could be discharged tomorrow morning.    LOS: 2 days    Jerre SimonJessica L Focht , [facility] LPA-C [facility] 08/15/2016, 7:47 AM Pager: [phone] Consults: [phone] Mon-Fri 7:00 am-4:30 pm Sat-Sun 7:00 am-11:30 am

## 2016-08-15 NOTE — Progress Notes (Signed)
Patient places self on CPAP when ready. RT will assist if needed.  

## 2016-08-16 MED ORDER — VALACYCLOVIR HCL 500 MG PO TABS
1000.0000 mg | ORAL_TABLET | Freq: Once | ORAL | Status: AC
  Administered 2016-08-16: 1000 mg via ORAL
  Filled 2016-08-16: qty 2

## 2016-08-16 MED ORDER — OXYCODONE HCL 5 MG PO TABS: 5.0000 mg | ORAL_TABLET | ORAL | 0 refills | Status: DC | PRN

## 2016-08-16 NOTE — Progress Notes (Signed)
Discharge instructions reviewed with pt and prescription given.  Pt verbalized understanding and had no questions.  Pt discharged in stable condition via wheelchair with husband.  Kaylea Mounsey Lindsay   

## 2016-08-16 NOTE — Discharge Summary (Signed)
[facility] Discharge Summary   Patient ID: Kimberly Castro MRN: [id number] DOB/AGE: 1960-10-24 56 y.o.  Admit date: 08/12/2016 Discharge date: 08/16/2016  Admitting Diagnosis: Small Bowel Obstruction Morbid obesity HTN Obstructive sleep apnea Hx of gastric bypass  Discharge Diagnosis Patient Active Problem List   Diagnosis Date Noted  . Small bowel obstruction 08/12/2016  . Essential hypertension 03/31/2016  . Fibromyalgia 03/31/2016  . Morbid obesity due to excess calories (HCC) 03/31/2016  . Obstructive sleep apnea syndrome 03/31/2016  . S/P gastric bypass 03/31/2016  . Palpitations 03/31/2016    Consultants None  Imaging: No results found.  Procedures Dr. Johna Sheriff (08/12/16) - Laparoscopic repair of internal hernia at retrocolic mesenteric defect  Hospital Course:  Kimberly Castro is a 56 year old female with a history of laparoscopic Roux-en-Y gastric bypass at [facility] [place] in 2004 who presented to [facility] with abdominal pain.  Workup showed small bowel obstruction with concerns for internal hernia.  Patient was admitted and underwent procedure listed above.  Tolerated procedure well and was transferred to the floor.  On POD#1 pt was complaining of headache. She was given tylenol which helped. Diet was advanced as tolerated.  On POD#4, the patient was voiding well, tolerating diet, ambulating well, pain well controlled, vital signs stable, incisions c/d/i and felt stable for discharge home.  Patient will follow up in our office in 2 weeks to see Dr. Johna Sheriff and knows to call with questions or concerns.  She will call to schedule or confirm appointment date/time.    Patient was discharged in good condition.  The [place] Substance controlled database was reviewed prior to prescribing narcotic pain medication to this patient.   Allergies as of 08/16/2016      Reactions   Dye Fdc Red 3 (erythrosine) Swelling   Iv dye      Medication List    STOP  taking these medications   clonazePAM 0.5 MG tablet Commonly known as:  KLONOPIN   DULoxetine 30 MG capsule Commonly known as:  CYMBALTA   HYDROcodone-acetaminophen 7.5-325 MG tablet Commonly known as:  NORCO   predniSONE 20 MG tablet Commonly known as:  DELTASONE     TAKE these medications   amphetamine-dextroamphetamine 30 MG 24 hr capsule Commonly known as:  ADDERALL XR 1   atorvastatin 20 MG tablet Commonly known as:  LIPITOR Take 20 mg by mouth daily.   cetirizine 10 MG tablet Commonly known as:  ZYRTEC Take 10 mg by mouth daily.   fluticasone 50 MCG/ACT nasal spray Commonly known as:  FLONASE Place 1 spray into both nostrils as needed.   GNP VITAMIN B-12 1000 MCG Tbcr Generic drug:  Cyanocobalamin 1 tablet   olmesartan-hydrochlorothiazide 40-25 MG tablet Commonly known as:  BENICAR HCT Take 1 tablet by mouth daily. What changed:  Another medication with the same name was removed. Continue taking this medication, and follow the directions you see here.   oxyCODONE 5 MG immediate release tablet Commonly known as:  Oxy IR/ROXICODONE Take 1 tablet (5 mg total) by mouth every 4 (four) hours as needed for moderate pain or severe pain (5mg  for moderate pain, 10mg  for severe pain).   rosuvastatin 20 MG tablet Commonly known as:  CRESTOR Take 1 tablet (20 mg total) by mouth daily.   VALTREX 1000 MG tablet Generic drug:  valACYclovir Take 1 tablet by mouth daily.   venlafaxine XR 150 MG 24 hr capsule Commonly known as:  EFFEXOR-XR Take 150 mg by mouth daily.   zolpidem 10  MG tablet Commonly known as:  AMBIEN TK 1 T PO HS PRN        Follow-up Information    HOXWORTH,BENJAMIN T, MD. Schedule an appointment as soon as possible for a visit in 2 week(s).   Specialty:  General Surgery Why:  for follow up Contact information: [address] [place] [address] [phone]           Signed: Joyce CopaJessica L [facility] 08/16/2016, 9:01 AM Pager: [phone] Consults: [phone] Mon-Fri 7:00 am-4:30 pm Sat-Sun 7:00 am-11:30 am

## 2016-08-16 NOTE — Progress Notes (Signed)
[facility] Progress Note  4 Days Post-Op  Subjective: Pt is tolerating her diet. No fever or chills overnight. Pt is still having regular BM's. Pt developed sinus congestion with a dry cough overnight and a cold sore. She is ready to go home.   Objective: Vital signs in last 24 hours: Temp:  [98.3 F (36.8 C)] 98.3 F (36.8 C) (02/28 0406) Pulse Rate:  [73-88] 73 (02/28 0406) Resp:  [19-29] 29 (02/28 0406) BP: (135-164)/(69-70) 135/69 (02/28 0406) SpO2:  [92 %-94 %] 94 % (02/28 0406) Last BM Date: 08/15/16  Intake/Output from previous day: 02/27 0701 - 02/28 0700 In: 460 [P.O.:460] Out: -  Intake/Output this shift: Total I/O In: 120 [P.O.:120] Out: -   PE: Gen:  Alert, NAD, pleasant, cooperative, well appearing Card:  RRR, mild systolic murmur noted Pulm:  CTA, no wheezes, rales or rhonchi noted, Rate and effort normal Abd: Soft, nondistended, +BS incisions appear well healing, mild generalized TTP Skin: no rashes noted, warm and dry  Lab Results:   Recent Labs  08/15/16 0833  WBC 9.2  HGB 10.9*  HCT 32.9*  PLT 243   BMET  Recent Labs  08/15/16 0833  NA 140  K 3.6  CL 104  CO2 25  GLUCOSE 130*  BUN 13  CREATININE 0.95  CALCIUM 9.0   PT/INR No results for input(s): LABPROT, INR in the last 72 hours. CMP     Component Value Date/Time   NA 140 08/15/2016 0833   K 3.6 08/15/2016 0833   CL 104 08/15/2016 0833   CO2 25 08/15/2016 0833   GLUCOSE 130 (H) 08/15/2016 0833   BUN 13 08/15/2016 0833   CREATININE 0.95 08/15/2016 0833   CALCIUM 9.0 08/15/2016 0833   PROT 5.7 (L) 08/13/2016 0511   ALBUMIN 3.1 (L) 08/13/2016 0511   AST 27 08/13/2016 0511   ALT 20 08/13/2016 0511   ALKPHOS 58 08/13/2016 0511   BILITOT 0.5 08/13/2016 0511   GFRNONAA >60 08/15/2016 0833   GFRAA >60 08/15/2016 0833   Lipase     Component Value Date/Time   LIPASE 24 08/12/2016 0429       Studies/Results: No results  found.  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 Johna SheriffHoxworth - +flatus and BM's. Tolerating diet.  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 Will be discharged home today    LOS: 3 days    Jerre SimonJessica L Eldine Rencher , [facility] 08/16/2016, 8:53 AM Pager: [phone] Consults: [phone] Mon-Fri 7:00 am-4:30 pm Sat-Sun 7:00 am-11:30 am

## 2016-08-16 NOTE — Discharge Instructions (Signed)
Please arrive at least 30 min before your appointment to complete your check in paperwork.  If you are unable to arrive 30 min prior to your appointment time we may have to cancel or reschedule you.  LAPAROSCOPIC SURGERY: POST OP INSTRUCTIONS  1. DIET: Follow a light bland diet the first 24 hours after arrival home, such as soup, liquids, crackers, etc. Be sure to include lots of fluids daily. Avoid fast food or heavy meals as your are more likely to get nauseated. Eat a low fat the next few days after surgery.  2. Take your usually prescribed home medications unless otherwise directed. 3. PAIN CONTROL:  1. Pain is best controlled by a usual combination of three different methods TOGETHER:  1. Ice/Heat 2. Over the counter pain medication 3. Prescription pain medication 2. Most patients will experience some swelling and bruising around the incisions. Ice packs or heating pads (30-60 minutes up to 6 times a day) will help. Use ice for the first few days to help decrease swelling and bruising, then switch to heat to help relax tight/sore spots and speed recovery. Some people prefer to use ice alone, heat alone, alternating between ice & heat. Experiment to what works for you. Swelling and bruising can take several weeks to resolve.  3. It is helpful to take an over-the-counter pain medication regularly for the first few weeks. Choose one of the following that works best for you:  1. Naproxen (Aleve, etc) Two 220mg  tabs twice a day 2. Ibuprofen (Advil, etc) Three 200mg  tabs four times a day (every meal & bedtime) 3. Acetaminophen (Tylenol, etc) 500-650mg  four times a day (every meal & bedtime) 4. A prescription for pain medication (such as oxycodone, hydrocodone, etc) should be given to you upon discharge. Take your pain medication as prescribed.  1. If you are having problems/concerns with the prescription medicine (does not control pain, nausea, vomiting, rash, itching, etc), please call us [phone]  [phone] to see if we need to switch you to a different pain medicine that will work better for you and/or control your side effect better. 2. If you need a refill on your pain medication, please contact your pharmacy. They will contact our office to request authorization. Prescriptions will not be filled after 5 pm or on week-ends. 4. Avoid getting constipated. Between the surgery and the pain medications, it is common to experience some constipation. Increasing fluid intake and taking a fiber supplement (such as Metamucil, Citrucel, FiberCon, MiraLax, etc) 1-2 times a day regularly will usually help prevent this problem from occurring. A mild laxative (prune juice, Milk of Magnesia, MiraLax, etc) should be taken according to package directions if there are no bowel movements after 48 hours.  5. Watch out for diarrhea. If you have many loose bowel movements, simplify your diet to bland foods & liquids for a few days. Stop any stool softeners and decrease your fiber supplement. Switching to mild anti-diarrheal medications (Kayopectate, Pepto Bismol) can help. If this worsens or does not improve, please call us. 6. Wash / shower every day. You may shower over the dressings as they are waterproof. Continue to shower over incision(s) after the dressing is off. 7. Remove your waterproof bandages 5 days after surgery. You may leave the incision open to air. You may replace a dressing/Band-Aid to cover the incision for comfort if you wish.  8. ACTIVITIES as tolerated:  1. NO LIFTING >15lbs for 6 weeks 2. You may resume regular (light) daily activities beginning the next  day--such as daily self-care, walking, climbing stairs--gradually increasing activities as tolerated. If you can walk 30 minutes without difficulty, it is safe to try more intense activity such as jogging, treadmill, bicycling, low-impact aerobics, swimming, etc. 3. Save the most intensive and strenuous activity for last such as sit-ups, heavy  lifting, contact sports, etc Refrain from any heavy lifting or straining until you are off narcotics for pain control.  4. DO NOT PUSH THROUGH PAIN. Let pain be your guide: If it hurts to do something, don't do it. Pain is your body warning you to avoid that activity for another week until the pain goes down. 5. You may drive when you are no longer taking prescription pain medication, you can comfortably wear a seatbelt, and you can safely maneuver your car and apply brakes. 6. You may have sexual intercourse when it is comfortable.  9. FOLLOW UP in our office  1. Please call [facility] at [phone] to set up an appointment to see your surgeon in the office for a follow-up appointment approximately 2-3 weeks after your surgery. 2. Make sure that you call for this appointment the day you arrive home to insure a convenient appointment time.      10. IF YOU HAVE DISABILITY OR FAMILY LEAVE FORMS, BRING THEM TO THE               OFFICE FOR PROCESSING.   WHEN TO CALL US [phone](336) [phone]:  1. Poor pain control 2. Reactions / problems with new medications (rash/itching, nausea, etc)  3. Fever over 101.5 F (38.5 C) 4. Inability to urinate 5. Nausea and/or vomiting 6. Worsening swelling or bruising 7. Continued bleeding from incision. 8. Increased pain, redness, or drainage from the incision  The clinic staff is available to answer your questions during regular business hours (8:30am-5pm). Please dont hesitate to call and ask to speak to one of our nurses for clinical concerns.  If you have a medical emergency, go to the nearest emergency room or call 911.  A surgeon from [facility] is always on call at the [facility]   [facility], [place]  [address], [address], [place], [place] [address] ?  MAIN: [phone] ? TOLL FREE: [phone] ?  FAX [phone]  [email or web address]

## 2016-11-20 NOTE — Addendum Note (Signed)
Addendum  created 11/20/16 1142 by Chrsitopher Wik, MD   Sign clinical note    

## 2016-12-15 ENCOUNTER — Other Ambulatory Visit: Payer: Self-pay | Admitting: Family Medicine

## 2016-12-15 DIAGNOSIS — N183 Chronic kidney disease, stage 3 unspecified: Secondary | ICD-10-CM

## 2016-12-19 ENCOUNTER — Encounter (INDEPENDENT_AMBULATORY_CARE_PROVIDER_SITE_OTHER): Payer: Self-pay | Admitting: Internal Medicine

## 2016-12-19 ENCOUNTER — Encounter (INDEPENDENT_AMBULATORY_CARE_PROVIDER_SITE_OTHER): Payer: Self-pay

## 2017-01-02 ENCOUNTER — Ambulatory Visit (INDEPENDENT_AMBULATORY_CARE_PROVIDER_SITE_OTHER): Payer: Managed Care, Other (non HMO) | Admitting: Internal Medicine

## 2017-01-02 DIAGNOSIS — F900 Attention-deficit hyperactivity disorder, predominantly inattentive type: Secondary | ICD-10-CM | POA: Insufficient documentation

## 2017-01-02 DIAGNOSIS — E538 Deficiency of other specified B group vitamins: Secondary | ICD-10-CM | POA: Insufficient documentation

## 2017-01-02 DIAGNOSIS — G4700 Insomnia, unspecified: Secondary | ICD-10-CM | POA: Insufficient documentation

## 2017-01-05 ENCOUNTER — Ambulatory Visit
Admission: RE | Admit: 2017-01-05 | Discharge: 2017-01-05 | Disposition: A | Discharge status: Home / Self Care | Payer: Managed Care, Other (non HMO) | Source: Ambulatory Visit | Attending: Family Medicine | Admitting: Family Medicine

## 2017-01-05 ENCOUNTER — Other Ambulatory Visit: Payer: Managed Care, Other (non HMO)

## 2017-01-05 DIAGNOSIS — N183 Chronic kidney disease, stage 3 unspecified: Secondary | ICD-10-CM

## 2017-01-26 ENCOUNTER — Ambulatory Visit (INDEPENDENT_AMBULATORY_CARE_PROVIDER_SITE_OTHER): Payer: Managed Care, Other (non HMO) | Admitting: Internal Medicine

## 2017-01-26 ENCOUNTER — Encounter (INDEPENDENT_AMBULATORY_CARE_PROVIDER_SITE_OTHER): Payer: Self-pay | Admitting: Internal Medicine

## 2017-01-26 VITALS — BP 150/80 | HR 60 | Temp 97.8°F | Ht 66.0 in | Wt 298.8 lb

## 2017-01-26 DIAGNOSIS — Z1211 Encounter for screening for malignant neoplasm of colon: Secondary | ICD-10-CM | POA: Diagnosis not present

## 2017-01-26 DIAGNOSIS — R197 Diarrhea, unspecified: Secondary | ICD-10-CM | POA: Diagnosis not present

## 2017-01-26 MED ORDER — DICYCLOMINE HCL 10 MG PO CAPS: 10.0000 mg | ORAL_CAPSULE | Freq: Two times a day (BID) | ORAL | 3 refills | Status: DC

## 2017-01-26 NOTE — Progress Notes (Signed)
Subjective:    Patient ID: Kimberly Castro, female    DOB: 26-Aug-1960, 56 y.o.   MRN: [id number]  HPI Referred by Dr. Sigmund Hazel for colonoscopy.Hx of explosive diarrhea. She has never had a colonoscopy in the past. No family hx of colon cancer. She says she has a hx of hiatal hernia (told about 30 yrs ago). Her appetite is good for the most part. She is not dieting. She has some abdominal discomfort. She has constant gas and bloating. She feels gas bubbles moving around. She has had diarrhea for the past 6 weeks.  She says the diarrhea is sudden. She is having x 4 stools a day all during the day. She has urgency. Symptoms are new since her surgery.  She has tried Imodium which did not help. She says Dr. Hyacinth Meeker gave her an Augmentin for her diarrhea which did not help.    Recently underwent a laparoscopic repair of internal hernia at retrcolic mesenteric defect in February of this year by Dr. Irean Hong.  12/08/2016 H and H 12.0 and 35.4    Review of Systems Past Medical History:  Diagnosis Date  . ADD (attention deficit disorder)   . Chronic fatigue   . Depression   . Fibromyalgia   . Hypertension   . Palpitations 03/31/2016  . Sleep apnea     Past Surgical History:  Procedure Laterality Date  . LAPAROSCOPY N/A 08/12/2016   Procedure: LAPAROSCOPY DIAGNOSTIC, POSSIBLE LAPAROTOMY, POSSIBLE BOWEL RESECTION;  Surgeon: Glenna Fellows, MD;  Location: [facility] OR;  Service: General;  Laterality: N/A;    Allergies  Allergen Reactions  . Dye Fdc Red 3 (Erythrosine) Swelling    Iv dye    Current Outpatient Prescriptions on File Prior to Visit  Medication Sig Dispense Refill  . amphetamine-dextroamphetamine (ADDERALL XR) 30 MG 24 hr capsule 1    . atorvastatin (LIPITOR) 20 MG tablet Take 20 mg by mouth daily.    . cetirizine (ZYRTEC) 10 MG tablet Take 10 mg by mouth daily.    . Cyanocobalamin (GNP VITAMIN B-12) 1000 MCG TBCR 1 tablet    . fluticasone (FLONASE) 50 MCG/ACT nasal spray Place  1 spray into both nostrils as needed.  1  . olmesartan-hydrochlorothiazide (BENICAR HCT) 40-25 MG tablet Take 1 tablet by mouth daily.  0  . valACYclovir (VALTREX) 1000 MG tablet Take 1 tablet by mouth as needed.     . venlafaxine XR (EFFEXOR-XR) 150 MG 24 hr capsule Take 150 mg by mouth daily.  1  . zolpidem (AMBIEN) 10 MG tablet TK 1 T PO HS PRN  0  . oxyCODONE (OXY IR/ROXICODONE) 5 MG immediate release tablet Take 1 tablet (5 mg total) by mouth every 4 (four) hours as needed for moderate pain or severe pain (5mg  for moderate pain, 10mg  for severe pain). 15 tablet 0  . rosuvastatin (CRESTOR) 20 MG tablet Take 1 tablet (20 mg total) by mouth daily. 90 tablet 1   No current facility-administered medications on file prior to visit.         Objective:   Physical Exam Blood pressure (!) 150/80, pulse 60, temperature 97.8 F (36.6 C), height 5\' 6"  (1.676 m), weight 298 lb 12.8 oz (135.5 kg).  Alert and oriented. Skin warm and dry. Oral mucosa is moist.   . Sclera anicteric, conjunctivae is pink. Thyroid not enlarged. No cervical lymphadenopathy. Lungs clear. Heart regular rate and rhythm.  Abdomen is soft. Bowel sounds are positive. No hepatomegaly. No abdominal masses felt. No  tenderness.  No edema to lower extremities. P    Grossly obese.         Assessment & Plan:  Diarrhea. Am going to get a GI pathogen.  Rx for Dicyclomine 10mg  BID Will set her up for a screening colonoscopy.

## 2017-01-26 NOTE — Patient Instructions (Signed)
GI pathogen.  Rx Dicyclomine 10mg  twice a day.

## 2017-01-30 ENCOUNTER — Other Ambulatory Visit (INDEPENDENT_AMBULATORY_CARE_PROVIDER_SITE_OTHER): Payer: Self-pay | Admitting: Internal Medicine

## 2017-01-30 DIAGNOSIS — Z1211 Encounter for screening for malignant neoplasm of colon: Secondary | ICD-10-CM | POA: Insufficient documentation

## 2017-02-01 LAB — GASTROINTESTINAL PATHOGEN PANEL PCR
C. difficile Tox A/B, PCR: NOT DETECTED
Campylobacter, PCR: NOT DETECTED
Cryptosporidium, PCR: NOT DETECTED
E coli (ETEC) LT/ST PCR: NOT DETECTED
E coli (STEC) stx1/stx2, PCR: NOT DETECTED
E coli 0157, PCR: NOT DETECTED
Giardia lamblia, PCR: NOT DETECTED
Norovirus, PCR: NOT DETECTED
Rotavirus A, PCR: NOT DETECTED
Salmonella, PCR: NOT DETECTED
Shigella, PCR: NOT DETECTED

## 2017-02-23 ENCOUNTER — Ambulatory Visit: Payer: Managed Care, Other (non HMO) | Admitting: Cardiovascular Disease

## 2017-03-20 ENCOUNTER — Telehealth (INDEPENDENT_AMBULATORY_CARE_PROVIDER_SITE_OTHER): Payer: Self-pay | Admitting: *Deleted

## 2017-03-20 ENCOUNTER — Encounter (INDEPENDENT_AMBULATORY_CARE_PROVIDER_SITE_OTHER): Payer: Self-pay | Admitting: *Deleted

## 2017-03-20 NOTE — Telephone Encounter (Signed)
Patient needs trilyte 

## 2017-03-21 MED ORDER — PEG 3350-KCL-NA BICARB-NACL 420 G PO SOLR: 4000.0000 mL | Freq: Once | ORAL | 0 refills | Status: AC

## 2017-03-30 ENCOUNTER — Telehealth: Payer: Self-pay | Admitting: *Deleted

## 2017-03-30 ENCOUNTER — Encounter: Payer: Self-pay | Admitting: *Deleted

## 2017-03-30 DIAGNOSIS — F331 Major depressive disorder, recurrent, moderate: Secondary | ICD-10-CM | POA: Insufficient documentation

## 2017-03-30 DIAGNOSIS — G4700 Insomnia, unspecified: Secondary | ICD-10-CM | POA: Insufficient documentation

## 2017-03-30 DIAGNOSIS — L659 Nonscarring hair loss, unspecified: Secondary | ICD-10-CM | POA: Insufficient documentation

## 2017-03-30 DIAGNOSIS — F988 Other specified behavioral and emotional disorders with onset usually occurring in childhood and adolescence: Secondary | ICD-10-CM | POA: Insufficient documentation

## 2017-03-30 DIAGNOSIS — E785 Hyperlipidemia, unspecified: Secondary | ICD-10-CM | POA: Insufficient documentation

## 2017-03-30 DIAGNOSIS — F419 Anxiety disorder, unspecified: Secondary | ICD-10-CM | POA: Insufficient documentation

## 2017-03-30 DIAGNOSIS — E538 Deficiency of other specified B group vitamins: Secondary | ICD-10-CM | POA: Insufficient documentation

## 2017-03-30 NOTE — Telephone Encounter (Signed)
Referral notes from Sigmund Hazel, MD of [facility] @ [facility] sent to scheduling

## 2017-04-02 ENCOUNTER — Other Ambulatory Visit: Payer: Self-pay | Admitting: Gastroenterology

## 2017-04-02 DIAGNOSIS — Z1211 Encounter for screening for malignant neoplasm of colon: Secondary | ICD-10-CM

## 2017-04-02 NOTE — Progress Notes (Signed)
Artrell Lawless MD 

## 2017-04-03 ENCOUNTER — Other Ambulatory Visit: Payer: Self-pay | Admitting: Family Medicine

## 2017-04-03 ENCOUNTER — Telehealth (HOSPITAL_COMMUNITY): Payer: Self-pay | Admitting: Family Medicine

## 2017-04-03 DIAGNOSIS — R011 Cardiac murmur, unspecified: Secondary | ICD-10-CM

## 2017-04-06 NOTE — Telephone Encounter (Signed)
User: Trina AoGRIFFIN, Daris Harkins A Date/time: 04/03/17 10:02 AM  Comment: Called pt and lmsg for her to CB to get scheduled for an echo.   Context:  Outcome: Left Message  Phone number: [phone] Phone Type: Mobile  Comm. type: Telephone Call type: Outgoing  Contact: Rulon SeraBurke, Terrilynn Relation to patient: Self

## 2017-04-12 ENCOUNTER — Encounter (INDEPENDENT_AMBULATORY_CARE_PROVIDER_SITE_OTHER): Payer: Self-pay

## 2017-04-12 ENCOUNTER — Other Ambulatory Visit: Payer: Self-pay

## 2017-04-12 ENCOUNTER — Ambulatory Visit (HOSPITAL_COMMUNITY): Payer: Managed Care, Other (non HMO) | Attending: Cardiovascular Disease

## 2017-04-12 DIAGNOSIS — Z87891 Personal history of nicotine dependence: Secondary | ICD-10-CM | POA: Insufficient documentation

## 2017-04-12 DIAGNOSIS — I083 Combined rheumatic disorders of mitral, aortic and tricuspid valves: Secondary | ICD-10-CM | POA: Diagnosis not present

## 2017-04-12 DIAGNOSIS — G4730 Sleep apnea, unspecified: Secondary | ICD-10-CM | POA: Insufficient documentation

## 2017-04-12 DIAGNOSIS — R002 Palpitations: Secondary | ICD-10-CM | POA: Diagnosis not present

## 2017-04-12 DIAGNOSIS — E785 Hyperlipidemia, unspecified: Secondary | ICD-10-CM | POA: Diagnosis not present

## 2017-04-12 DIAGNOSIS — I10 Essential (primary) hypertension: Secondary | ICD-10-CM | POA: Diagnosis not present

## 2017-04-12 DIAGNOSIS — R011 Cardiac murmur, unspecified: Secondary | ICD-10-CM | POA: Diagnosis not present

## 2017-04-12 LAB — ECHOCARDIOGRAM COMPLETE
Ao-asc: 33 cm
E decel time: 215 msec
E/e' ratio: 20.08
FS: 31 % (ref 28–44)
IVS/LV PW RATIO, ED: 0.97
LA ID, A-P, ES: 52 mm
LA diam end sys: 52 mm
LA diam index: 2.01 cm/m2
LA vol A4C: 83.6 ml
LA vol index: 32.4 mL/m2
LA vol: 83.9 mL
LV E/e' medial: 20.08
LV E/e'average: 20.08
LV PW d: 8.86 mm — AB (ref 0.6–1.1)
LV e' LATERAL: 7.62 cm/s
LVOT SV: 105 mL
LVOT VTI: 33.3 cm
LVOT area: 3.14 cm2
LVOT diameter: 20 mm
LVOT peak grad rest: 8 mmHg
LVOT peak vel: 137 cm/s
Lateral S' vel: 14.6 cm/s
MV Dec: 215
MV Peak grad: 9 mmHg
MV pk A vel: 144 m/s
MV pk E vel: 153 m/s
P 1/2 time: 468 ms
TAPSE: 31.8 mm
TDI e' lateral: 7.62
TDI e' medial: 7.63

## 2017-04-20 ENCOUNTER — Inpatient Hospital Stay: Admission: RE | Admit: 2017-04-20 | Payer: Managed Care, Other (non HMO) | Source: Ambulatory Visit

## 2017-05-01 ENCOUNTER — Telehealth (INDEPENDENT_AMBULATORY_CARE_PROVIDER_SITE_OTHER): Payer: Self-pay | Admitting: *Deleted

## 2017-05-01 NOTE — Telephone Encounter (Signed)
Patient left message to cancel TCS sch'd 05/03/17 -- didn't give reason -- TCS has been cancceld

## 2017-05-01 NOTE — Telephone Encounter (Signed)
noted 

## 2017-05-03 ENCOUNTER — Ambulatory Visit (HOSPITAL_COMMUNITY)
Admission: RE | Admit: 2017-05-03 | Payer: Managed Care, Other (non HMO) | Source: Ambulatory Visit | Admitting: Internal Medicine

## 2017-05-03 ENCOUNTER — Encounter (HOSPITAL_COMMUNITY): Admission: RE | Payer: Self-pay | Source: Ambulatory Visit

## 2017-05-03 SURGERY — COLONOSCOPY
Anesthesia: Moderate Sedation

## 2017-05-04 ENCOUNTER — Ambulatory Visit (INDEPENDENT_AMBULATORY_CARE_PROVIDER_SITE_OTHER): Payer: Managed Care, Other (non HMO) | Admitting: Cardiovascular Disease

## 2017-05-04 ENCOUNTER — Encounter: Payer: Self-pay | Admitting: Cardiovascular Disease

## 2017-05-04 VITALS — BP 100/62 | HR 76 | Ht 66.0 in | Wt 270.0 lb

## 2017-05-04 DIAGNOSIS — R0602 Shortness of breath: Secondary | ICD-10-CM

## 2017-05-04 DIAGNOSIS — I493 Ventricular premature depolarization: Secondary | ICD-10-CM

## 2017-05-04 DIAGNOSIS — E7800 Pure hypercholesterolemia, unspecified: Secondary | ICD-10-CM | POA: Diagnosis not present

## 2017-05-04 DIAGNOSIS — I491 Atrial premature depolarization: Secondary | ICD-10-CM | POA: Diagnosis not present

## 2017-05-04 DIAGNOSIS — I10 Essential (primary) hypertension: Secondary | ICD-10-CM

## 2017-05-04 MED ORDER — HYDROCHLOROTHIAZIDE 25 MG PO TABS: 25.0000 mg | ORAL_TABLET | Freq: Every day | ORAL | 5 refills | Status: DC

## 2017-05-04 NOTE — Patient Instructions (Signed)
Medication Instructions:  STOP OLMESARTAN HCTZ  START HYDROCHLOROTHIAZIDE 25 MG DAILY   Labwork: NONE  Testing/Procedures: Your physician has requested that you have a lexiscan myoview. For further information please visit [email or web address]. Please follow instruction sheet, as given. 2 DAY IN ABOUT A WEEK OR SO  Follow-Up: Your physician recommends that you schedule a follow-up appointment in: 1 MONTH OV  If you need a refill on your cardiac medications before your next appointment, please call your pharmacy.

## 2017-05-04 NOTE — Progress Notes (Signed)
Cardiology Office Note   Date:  05/11/2017   ID:  Kimberly Castro, DOB 06-30-1960, MRN [id number]  PCP:  Sigmund HazelMiller, Lisa, MD  Cardiologist:   Chilton Siiffany [place], MD   No chief complaint on file.    History of Present Illness: Kimberly Castro is a 56 y.o. female with OSA on CPAP, hyperlipidemia, hypertension and fibromyalgia who presents for follow-up.  She was first seen 03/2016 for an evaluation of palpitations.  She was referred for 14-day event monitor that showed PACs and PVCs.  She had laboratory testing that revealed normal blood counts and normal electrolytes. 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  Her thyroid function was normal. D-dimer was mildly elevated but she had a subsequent CT angiogram of the chest that was negative for PE.   Since her last appointment Kimberly Castro has been feeling poorly.  She reports fatigue for months.  She notes that she has been under a lot of stress.  Her mother died three months ago and her brother died 3 weeks ago. She lost 30lb in the last 2 months due to not eating.  She also notes many palpitations. She denies chest pain but gets short of breath when walking around work.  She denies lower extremity edema, orthopnea or PND.  She had a colonoscopy and her blood pressure became low.  She stopped taking HCTZ/olmesartan and developed some lower extremity edema.  She is now back on the medication and has mild dizziness.    Past Medical History:  Diagnosis Date  . ADD (attention deficit disorder)   . Chronic fatigue   . Depression   . Fibromyalgia   . Hypertension   . Palpitations 03/31/2016  . Sleep apnea     Past Surgical History:  Procedure Laterality Date  . GASTRIC BYPASS    . LAPAROSCOPY N/A 08/12/2016   Procedure: LAPAROSCOPY DIAGNOSTIC, POSSIBLE LAPAROTOMY, POSSIBLE BOWEL RESECTION;  Surgeon: Glenna FellowsBenjamin Hoxworth, MD;  Location: [facility] OR;  Service: General;  Laterality: N/A;  . PARTIAL HYSTERECTOMY     still has ovaries  . TONSILLECTOMY    . WISDOM TOOTH EXTRACTION       Current Outpatient Medications  Medication Sig Dispense Refill  . Eluxadoline (VIBERZI) 75 MG TABS Take 75 mg daily by mouth.    Marland Kitchen. amphetamine-dextroamphetamine (ADDERALL XR) 30 MG 24 hr capsule 1    . atorvastatin (LIPITOR) 20 MG tablet Take 20 mg by mouth daily.    . Biotin 1 MG CAPS Take by mouth daily.    . calcium citrate (CALCITRATE - DOSED IN MG ELEMENTAL CALCIUM) 950 MG tablet Take 630 mg of elemental calcium by mouth daily.    . cetirizine (ZYRTEC) 10 MG tablet Take 10 mg by mouth daily.    . clonazePAM (KLONOPIN) 0.5 MG tablet Take 0.5 mg by mouth 2 (two) times daily as needed for anxiety.    . Cranberry 400 MG CAPS Take 4,200 mg by mouth. Takes 3 a day    . Cyanocobalamin (GNP VITAMIN B-12) 1000 MCG TBCR 1 tablet    . dicyclomine (BENTYL) 10 MG capsule Take 1 capsule (10 mg total) by mouth 2 (two) times daily before a meal. 60 capsule 3  . fluticasone (FLONASE) 50 MCG/ACT nasal spray Place 1 spray into both nostrils as needed.  1  . hydrochlorothiazide (HYDRODIURIL) 25 MG tablet Take 1 tablet (25 mg total) daily by mouth. 30 tablet 5  . Multiple Vitamin (MULTIVITAMIN) tablet  Take 1 tablet by mouth daily.    . rosuvastatin (CRESTOR) 20 MG tablet Take 1 tablet (20 mg total) by mouth daily. 90 tablet 1  . Turmeric 500 MG CAPS Take by mouth.    . valACYclovir (VALTREX) 1000 MG tablet Take 1 tablet by mouth as needed.     . venlafaxine XR (EFFEXOR-XR) 150 MG 24 hr capsule Take 150 mg by mouth daily.  1  . zolpidem (AMBIEN) 10 MG tablet TK 1 T PO HS PRN  0   No current facility-administered medications for this visit.     Allergies:   Dye fdc red 3 (erythrosine)    Social History:  The patient  reports that she has quit smoking. she has never used smokeless tobacco. She reports that she drinks alcohol. She reports that she does not use drugs.   Family History:  The patient's family history includes AAA  (abdominal aortic aneurysm) in her father; Atrial fibrillation in her mother; CAD in her brother; COPD in her brother, brother, father, and mother; Diabetes in her brother, maternal grandfather, maternal grandmother, and paternal grandmother; Heart disease in her father, maternal grandfather, maternal grandmother, and paternal grandfather; Heart failure in her father and mother; Kidney disease in her mother; Kidney failure in her mother; Peripheral Artery Disease in her brother; Stroke in her brother and paternal grandmother; Testicular cancer (age of onset: 64) in her son.    ROS:  Please see the history of present illness.   Otherwise, review of systems are positive for none.   All other systems are reviewed and negative.    PHYSICAL EXAM: VS:  BP 100/62   Pulse 76   Ht 5\' 6"  (1.676 m)   Wt 122.5 kg (270 lb)   BMI 43.58 kg/m  , BMI Body mass index is 43.58 kg/m. GENERAL:  Well appearing HEENT:  Pupils equal round and reactive, fundi not visualized, oral mucosa unremarkable NECK:  No jugular venous distention, waveform within normal limits, carotid upstroke brisk and symmetric, no bruits, no thyromegaly LYMPHATICS:  No cervical adenopathy LUNGS:  Clear to auscultation bilaterally HEART:  RRR.  PMI not displaced or sustained,S1 and S2 within normal limits, no S3, no S4, no clicks, no rubs, III/VI systolic murmur ABD:  Flat, positive bowel sounds normal in frequency in pitch, no bruits, no rebound, no guarding, no midline pulsatile mass, no hepatomegaly, no splenomegaly EXT:  2 plus pulses throughout, no edema, no cyanosis no clubbing SKIN:  No rashes no nodules NEURO:  Cranial nerves II through XII grossly intact, motor grossly intact throughout PSYCH:  Cognitively intact, oriented to person place and time   EKG:  EKG is ordered today. The ekg ordered 03/31/16 demonstrates sinus rhythm rate 75 bpm.  05/04/17: Sinus rhythm.  Rate 76 bpm.   11 Day Event Monitor 04/03/16:  Quality:  Fair.  Baseline artifact. Predominant rhythm: sinus rhythm  PVCs, PACs and atrial runs were noted  Echo 04/12/17: Study Conclusions  - Left ventricle: The cavity size was normal. Systolic function was   normal. The estimated ejection fraction was in the range of 55%   to 60%. Wall motion was normal; there were no regional wall   motion abnormalities. Doppler parameters are consistent with both   elevated ventricular end-diastolic filling pressure and elevated   left atrial filling pressure. - Aortic valve: There was mild regurgitation. - Mitral valve: There was mild regurgitation. - Left atrium: The atrium was moderately dilated. - Atrial septum: A patent foramen ovale  cannot be excluded.   Recent Labs: 08/13/2016: ALT 20 08/15/2016: BUN 13; Creatinine, Ser 0.95; Hemoglobin 10.9; Platelets 243; Potassium 3.6; Sodium 140    02/25/16: WBC 10.1, hemoglobin 12.1, hematocrit 36.2, platelets 319 Sodium 143, potassium 4.5, BUN 24, creatinine 1.05 AST 27, ALT 24 TSH 0.886 D-dimer 0.68 Troponin I less than 0.01 Total cholesterol 262, triglycerides 131, HDL 75, LDL 161  Lipid Panel No results found for: CHOL, TRIG, HDL, CHOLHDL, VLDL, LDLCALC, LDLDIRECT    Wt Readings from Last 3 Encounters:  05/04/17 122.5 kg (270 lb)  01/26/17 135.5 kg (298 lb 12.8 oz)  08/12/16 (!) 137 kg (302 lb)      ASSESSMENT AND PLAN:  # Shortness of breath: We will get a Lexiscan Myoview to assess for ischemia.   # Palpitations: Symptoms persist.  PACs/PVCs seen on monitor.  Likely exacerbated by her recent stress.  Consider adding beta blocker.   # Hypertension:  Blood pressure is low and she has dizziness.  Will stop the Benicar and start HCTZ alone.  If her BP tolerates we may start a beta blocker for her palpitations.   # Hyperlipidemia: Continue rosuvastatin. LDL 85 11/2016.  # Murmur: Asymptomatic.  No significant valvular heart disease on echo.   Current medicines are reviewed at length with  the patient today.  The patient does not have concerns regarding medicines.  The following changes have been made:  Stop simvastatin.  Start rosuvastatin.   Labs/ tests ordered today include:   Orders Placed This Encounter  Procedures  . MYOCARDIAL PERFUSION IMAGING  . EKG 12-Lead     Disposition:   FU with Matas Burrows C. Duke Salviaandolph, MD, [facility] in one month.    This note was written with the assistance of speech recognition software.  Please excuse any transcriptional errors.  Signed, Amire Leazer C. Duke Salviaandolph, MD, [facility]  05/11/2017 3:21 PM    [facility]

## 2017-05-09 ENCOUNTER — Telehealth (HOSPITAL_COMMUNITY): Payer: Self-pay

## 2017-05-09 NOTE — Telephone Encounter (Signed)
Encounter complete. 

## 2017-05-11 ENCOUNTER — Encounter: Payer: Self-pay | Admitting: Cardiovascular Disease

## 2017-05-16 ENCOUNTER — Ambulatory Visit (HOSPITAL_COMMUNITY)
Admission: RE | Admit: 2017-05-16 | Discharge: 2017-05-16 | Disposition: A | Discharge status: Home / Self Care | Payer: Managed Care, Other (non HMO) | Source: Ambulatory Visit | Attending: Cardiology | Admitting: Cardiology

## 2017-05-16 DIAGNOSIS — R0602 Shortness of breath: Secondary | ICD-10-CM | POA: Diagnosis present

## 2017-05-16 MED ORDER — TECHNETIUM TC 99M TETROFOSMIN IV KIT
31.0000 | PACK | Freq: Once | INTRAVENOUS | Status: AC | PRN
  Administered 2017-05-16: 31 via INTRAVENOUS
  Filled 2017-05-16: qty 31

## 2017-05-16 MED ORDER — REGADENOSON 0.4 MG/5ML IV SOLN
0.4000 mg | Freq: Once | INTRAVENOUS | Status: AC
  Administered 2017-05-16: 0.4 mg via INTRAVENOUS

## 2017-05-17 ENCOUNTER — Ambulatory Visit (HOSPITAL_COMMUNITY)
Admission: RE | Admit: 2017-05-17 | Discharge: 2017-05-17 | Disposition: A | Discharge status: Home / Self Care | Payer: Managed Care, Other (non HMO) | Source: Ambulatory Visit | Attending: Cardiovascular Disease | Admitting: Cardiovascular Disease

## 2017-05-17 LAB — MYOCARDIAL PERFUSION IMAGING
LV dias vol: 121 mL (ref 46–106)
LV sys vol: 54 mL
Peak HR: 75 {beats}/min
Rest HR: 63 {beats}/min
SDS: 1
SRS: 0
SSS: 1
TID: 0.93

## 2017-05-17 MED ORDER — TECHNETIUM TC 99M TETROFOSMIN IV KIT
30.1000 | PACK | Freq: Once | INTRAVENOUS | Status: AC | PRN
  Administered 2017-05-17: 30.1 via INTRAVENOUS

## 2017-06-05 ENCOUNTER — Ambulatory Visit: Payer: Managed Care, Other (non HMO) | Admitting: Cardiovascular Disease

## 2017-06-08 ENCOUNTER — Telehealth: Payer: Self-pay | Admitting: Cardiovascular Disease

## 2017-06-08 NOTE — Telephone Encounter (Signed)
Follow Up ° ° ° °Returning call from earlier. Please call. °

## 2017-06-08 NOTE — Telephone Encounter (Signed)
LMTCB
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
## 2017-06-08 NOTE — Telephone Encounter (Signed)
Per 05/04/17 MD note: # Hypertension:  Blood pressure is low and she has dizziness.  Will stop the Benicar and start HCTZ alone.  If her BP tolerates we may start a beta blocker for her palpitations.

## 2017-06-08 NOTE — Telephone Encounter (Signed)
°  New Prob  Pt c/o BP issue: STAT if pt c/o blurred vision, one-sided weakness or slurred speech  1. What are your last 5 BP readings?  152/80 (today); 147/87 (yesterday); 144/80 (Wednesday)  2. Are you having any other symptoms (ex. Dizziness, headache, blurred vision, passed out)? No  3. What is your BP issue? Concerned about her blood pressure being high

## 2017-06-11 NOTE — Telephone Encounter (Signed)
Start metoprolol tartrate 25mg  bid.

## 2017-06-13 MED ORDER — METOPROLOL TARTRATE 25 MG PO TABS: 25.0000 mg | ORAL_TABLET | Freq: Two times a day (BID) | ORAL | 3 refills | Status: DC

## 2017-06-13 NOTE — Telephone Encounter (Signed)
Left detailed message for patient regarding dr [place]'s recommendations. New script sent to the pharmacy and patient to call to discuss.

## 2017-06-13 NOTE — Addendum Note (Signed)
Addended by: Freddi StarrMATHIS, Sukari Grist W on: 06/13/2017 02:37 PM   Modules accepted: Orders

## 2017-08-06 NOTE — Progress Notes (Deleted)
Cardiology Office Note   Date:  08/06/2017   ID:  Kimberly Castro, DOB 12-20-1960, MRN [id number]  PCP:  Kimberly HazelMiller, Lisa, MD  Cardiologist:   Kimberly Siiffany Foristell, MD   No chief complaint on file.    History of Present Illness: Kimberly Castro is a 57 y.o. female with OSA on CPAP, hyperlipidemia, hypertension and fibromyalgia who presents for follow-up.  She was first seen 03/2016 for an evaluation of palpitations.  She was referred for 14-day event monitor that showed PACs and PVCs.  She had laboratory testing that revealed normal blood counts and normal electrolytes. 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  Her thyroid function was normal. D-dimer was mildly elevated but she had a subsequent CT angiogram of the chest that was negative for PE.   At her last appointment Kimberly Castro reported atypical chest pain.  [facility] LVEF 55% and no ischemia.  She reported dizziness Benicar was discontinued and she was started on hydrochlorthiazide alone.  She was then started on metoprolol with hopes that would also help her palpitations.  Past Medical History:  Diagnosis Date  . ADD (attention deficit disorder)   . Chronic fatigue   . Depression   . Fibromyalgia   . Hypertension   . Palpitations 03/31/2016  . Sleep apnea     Past Surgical History:  Procedure Laterality Date  . GASTRIC BYPASS    . LAPAROSCOPY N/A 08/12/2016   Procedure: LAPAROSCOPY DIAGNOSTIC, POSSIBLE LAPAROTOMY, POSSIBLE BOWEL RESECTION;  Surgeon: Kimberly FellowsBenjamin Hoxworth, MD;  Location: [facility] OR;  Service: General;  Laterality: N/A;  . PARTIAL HYSTERECTOMY     still has ovaries  . TONSILLECTOMY    . WISDOM TOOTH EXTRACTION       Current Outpatient Medications  Medication Sig Dispense Refill  . amphetamine-dextroamphetamine (ADDERALL XR) 30 MG 24 hr capsule 1    . atorvastatin (LIPITOR) 20 MG tablet Take 20 mg by mouth daily.    . Biotin 1 MG  CAPS Take by mouth daily.    . calcium citrate (CALCITRATE - DOSED IN MG ELEMENTAL CALCIUM) 950 MG tablet Take 630 mg of elemental calcium by mouth daily.    . cetirizine (ZYRTEC) 10 MG tablet Take 10 mg by mouth daily.    . clonazePAM (KLONOPIN) 0.5 MG tablet Take 0.5 mg by mouth 2 (two) times daily as needed for anxiety.    . Cranberry 400 MG CAPS Take 4,200 mg by mouth. Takes 3 a day    . Cyanocobalamin (GNP VITAMIN B-12) 1000 MCG TBCR 1 tablet    . dicyclomine (BENTYL) 10 MG capsule Take 1 capsule (10 mg total) by mouth 2 (two) times daily before a meal. 60 capsule 3  . Eluxadoline (VIBERZI) 75 MG TABS Take 75 mg daily by mouth.    . fluticasone (FLONASE) 50 MCG/ACT nasal spray Place 1 spray into both nostrils as needed.  1  . hydrochlorothiazide (HYDRODIURIL) 25 MG tablet Take 1 tablet (25 mg total) daily by mouth. 30 tablet 5  . metoprolol tartrate (LOPRESSOR) 25 MG tablet Take 1 tablet (25 mg total) by mouth 2 (two) times daily. 180 tablet 3  . Multiple Vitamin (MULTIVITAMIN) tablet Take 1 tablet by mouth daily.    . rosuvastatin (CRESTOR) 20 MG tablet Take 1 tablet (20 mg total) by mouth daily. 90 tablet 1  . Turmeric 500 MG CAPS Take by mouth.    .Marland Kitchen  valACYclovir (VALTREX) 1000 MG tablet Take 1 tablet by mouth as needed.     . venlafaxine XR (EFFEXOR-XR) 150 MG 24 hr capsule Take 150 mg by mouth daily.  1  . zolpidem (AMBIEN) 10 MG tablet TK 1 T PO HS PRN  0   No current facility-administered medications for this visit.     Allergies:   Dye fdc red 3 (erythrosine)    Social History:  The patient  reports that she has quit smoking. she has never used smokeless tobacco. She reports that she drinks alcohol. She reports that she does not use drugs.   Family History:  The patient's family history includes AAA (abdominal aortic aneurysm) in her father; Atrial fibrillation in her mother; CAD in her brother; COPD in her brother, brother, father, and mother; Diabetes in her brother, maternal  grandfather, maternal grandmother, and paternal grandmother; Heart disease in her father, maternal grandfather, maternal grandmother, and paternal grandfather; Heart failure in her father and mother; Kidney disease in her mother; Kidney failure in her mother; Peripheral Artery Disease in her brother; Stroke in her brother and paternal grandmother; Testicular cancer (age of onset: 23) in her son.    ROS:  Please see the history of present illness.   Otherwise, review of systems are positive for none.   All other systems are reviewed and negative.    PHYSICAL EXAM: VS:  There were no vitals taken for this visit. , BMI There is no height or weight on file to calculate BMI. GENERAL:  Well appearing HEENT:  Pupils equal round and reactive, fundi not visualized, oral mucosa unremarkable NECK:  No jugular venous distention, waveform within normal limits, carotid upstroke brisk and symmetric, no bruits, no thyromegaly LYMPHATICS:  No cervical adenopathy LUNGS:  Clear to auscultation bilaterally HEART:  RRR.  PMI not displaced or sustained,S1 and S2 within normal limits, no S3, no S4, no clicks, no rubs, III/VI systolic murmur ABD:  Flat, positive bowel sounds normal in frequency in pitch, no bruits, no rebound, no guarding, no midline pulsatile mass, no hepatomegaly, no splenomegaly EXT:  2 plus pulses throughout, no edema, no cyanosis no clubbing SKIN:  No rashes no nodules NEURO:  Cranial nerves II through XII grossly intact, motor grossly intact throughout PSYCH:  Cognitively intact, oriented to person place and time   EKG:  EKG is ordered today. The ekg ordered 03/31/16 demonstrates sinus rhythm rate 75 bpm.  05/04/17: Sinus rhythm.  Rate 76 bpm.   11 Day Event Monitor 04/03/16:  Quality: Fair.  Baseline artifact. Predominant rhythm: sinus rhythm  PVCs, PACs and atrial runs were noted  Echo 04/12/17: Study Conclusions  - Left ventricle: The cavity size was normal. Systolic function  was   normal. The estimated ejection fraction was in the range of 55%   to 60%. Wall motion was normal; there were no regional wall   motion abnormalities. Doppler parameters are consistent with both   elevated ventricular end-diastolic filling pressure and elevated   left atrial filling pressure. - Aortic valve: There was mild regurgitation. - Mitral valve: There was mild regurgitation. - Left atrium: The atrium was moderately dilated. - Atrial septum: A patent foramen ovale cannot be excluded.   Lexiscan Myoview 05/17/18:  Probable normal perfusion and mild soft tissue attenuation (breast) No significant ischemia or scar.  LVEF 55%  This is a low risk study.    Recent Labs: 08/13/2016: ALT 20 08/15/2016: BUN 13; Creatinine, Ser 0.95; Hemoglobin 10.9; Platelets 243; Potassium 3.6;  Sodium 140    02/25/16: WBC 10.1, hemoglobin 12.1, hematocrit 36.2, platelets 319 Sodium 143, potassium 4.5, BUN 24, creatinine 1.05 AST 27, ALT 24 TSH 0.886 D-dimer 0.68 Troponin I less than 0.01 Total cholesterol 262, triglycerides 131, HDL 75, LDL 161  Lipid Panel No results found for: CHOL, TRIG, HDL, CHOLHDL, VLDL, LDLCALC, LDLDIRECT    Wt Readings from Last 3 Encounters:  05/16/17 270 lb (122.5 kg)  05/04/17 270 lb (122.5 kg)  01/26/17 298 lb 12.8 oz (135.5 kg)      ASSESSMENT AND PLAN:  # Shortness of breath: We will get a Lexiscan Myoview to assess for ischemia.   # Palpitations: Symptoms persist.  PACs/PVCs seen on monitor.  Likely exacerbated by her recent stress.  Consider adding beta blocker.   # Hypertension:  Blood pressure is low and she has dizziness.  Will stop the Benicar and start HCTZ alone.  If her BP tolerates we may start a beta blocker for her palpitations.   # Hyperlipidemia: Continue rosuvastatin. LDL 85 11/2016.  # Murmur: Asymptomatic.  No significant valvular heart disease on echo.   Current medicines are reviewed at length with the patient today.  The  patient does not have concerns regarding medicines.  The following changes have been made:  Stop simvastatin.  Start rosuvastatin.   Labs/ tests ordered today include:   No orders of the defined types were placed in this encounter.    Disposition:   FU with Lupe Handley C. Duke Salvia, MD, [facility] in one month.    This note was written with the assistance of speech recognition software.  Please excuse any transcriptional errors.  Signed, Shamonique Battiste C. Duke Salvia, MD, [facility]  08/06/2017 2:02 PM    [facility]

## 2017-08-07 ENCOUNTER — Ambulatory Visit: Payer: Managed Care, Other (non HMO) | Admitting: Cardiovascular Disease

## 2017-08-08 ENCOUNTER — Encounter: Payer: Self-pay | Admitting: *Deleted

## 2017-08-28 ENCOUNTER — Ambulatory Visit: Payer: Managed Care, Other (non HMO) | Admitting: Neurology

## 2017-08-29 ENCOUNTER — Ambulatory Visit: Payer: Managed Care, Other (non HMO) | Admitting: Cardiovascular Disease

## 2017-09-20 ENCOUNTER — Encounter: Payer: Self-pay | Admitting: Cardiovascular Disease

## 2017-09-20 ENCOUNTER — Ambulatory Visit: Payer: Managed Care, Other (non HMO) | Admitting: Cardiovascular Disease

## 2017-09-20 VITALS — BP 156/79 | HR 60 | Ht 66.0 in | Wt 287.8 lb

## 2017-09-20 DIAGNOSIS — I10 Essential (primary) hypertension: Secondary | ICD-10-CM

## 2017-09-20 DIAGNOSIS — Z5181 Encounter for therapeutic drug level monitoring: Secondary | ICD-10-CM

## 2017-09-20 DIAGNOSIS — I493 Ventricular premature depolarization: Secondary | ICD-10-CM | POA: Diagnosis not present

## 2017-09-20 DIAGNOSIS — I491 Atrial premature depolarization: Secondary | ICD-10-CM

## 2017-09-20 DIAGNOSIS — R0602 Shortness of breath: Secondary | ICD-10-CM | POA: Diagnosis not present

## 2017-09-20 DIAGNOSIS — E7800 Pure hypercholesterolemia, unspecified: Secondary | ICD-10-CM | POA: Diagnosis not present

## 2017-09-20 MED ORDER — ATORVASTATIN CALCIUM 40 MG PO TABS: 40.0000 mg | ORAL_TABLET | Freq: Every day | ORAL | 1 refills | Status: DC

## 2017-09-20 MED ORDER — HYDROCHLOROTHIAZIDE 12.5 MG PO TABS: 12.5000 mg | ORAL_TABLET | Freq: Every day | ORAL | 3 refills | Status: DC

## 2017-09-20 NOTE — Progress Notes (Signed)
Cardiology Office Note   Date:  09/20/2017   ID:  Kimberly Castro, DOB 08-05-1960, MRN [id number]  PCP:  Sigmund Hazel, MD  Cardiologist:   Chilton Si, MD   Chief Complaint  Patient presents with  . Follow-up    pt denied chets pain      History of Present Illness: Kimberly Castro is a 57 y.o. female with OSA on CPAP, hyperlipidemia, hypertension and fibromyalgia who presents for follow-up.  She was first seen April 20, 2016 for an evaluation of palpitations.  She was referred for 14-day event monitor that showed PACs and PVCs.  She had laboratory testing that revealed normal blood counts and normal electrolytes. 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  Her thyroid function was normal. D-dimer was mildly elevated but she had a subsequent CT angiogram of the chest that was negative for PE.  She had an echo April 20, 2017 that revealed LVEF 55-60%.  At her last appointment she reported exertional dyspnea.  She went for [facility] 04/2017 that revealed LVEF 55% and breast attenuation artifact but no ischemia.  Has been well physicallyMs. Kimberly Castro but has been under a lot of stress.  Her mother died 02/18/2017 and then her brother died 04/20/17.  She recently found out that her other brother has a tumor on his thyroid.  She was having almost daily headaches that has improved significantly.  She does not get much exercise and she sits almost all day at work.  She has no chest pain but does get short of breath with exertion.  She attributes this to being overweight and deconditioned.  She has no lower extremity edema, orthopnea, or PND.  Her blood pressure at home has ranged from the 100s to the 160s.  It is mostly been in the 130s-140s.  She does not have much salt in her diet.   Past Medical History:  Diagnosis Date  . ADD (attention deficit disorder)   . Chronic fatigue   . Depression   . Fibromyalgia   . Hypertension   . Palpitations 03/31/2016  . Sleep  apnea     Past Surgical History:  Procedure Laterality Date  . GASTRIC BYPASS    . LAPAROSCOPY N/A 08/12/2016   Procedure: LAPAROSCOPY DIAGNOSTIC, POSSIBLE LAPAROTOMY, POSSIBLE BOWEL RESECTION;  Surgeon: Glenna Fellows, MD;  Location: [facility] OR;  Service: General;  Laterality: N/A;  . PARTIAL HYSTERECTOMY     still has ovaries  . TONSILLECTOMY    . WISDOM TOOTH EXTRACTION       Current Outpatient Medications  Medication Sig Dispense Refill  . amphetamine-dextroamphetamine (ADDERALL XR) 30 MG 24 hr capsule 1    . atorvastatin (LIPITOR) 40 MG tablet Take 1 tablet (40 mg total) by mouth daily. 90 tablet 1  . Biotin 1 MG CAPS Take by mouth daily.    . calcium citrate (CALCITRATE - DOSED IN MG ELEMENTAL CALCIUM) 950 MG tablet Take 630 mg of elemental calcium by mouth daily.    . cetirizine (ZYRTEC) 10 MG tablet Take 10 mg by mouth daily.    . clonazePAM (KLONOPIN) 0.5 MG tablet Take 0.5 mg by mouth 2 (two) times daily as needed for anxiety.    . Cranberry 400 MG CAPS Take 4,200 mg by mouth. Takes 3 a day    . Cyanocobalamin (GNP VITAMIN B-12) 1000 MCG TBCR 1 tablet    . Eluxadoline (VIBERZI) 75 MG TABS Take 75 mg daily by mouth.    Marland Kitchen  fluticasone (FLONASE) 50 MCG/ACT nasal spray Place 1 spray into both nostrils as needed.  1  . metoprolol tartrate (LOPRESSOR) 25 MG tablet Take 1 tablet (25 mg total) by mouth 2 (two) times daily. 180 tablet 3  . Multiple Vitamin (MULTIVITAMIN) tablet Take 1 tablet by mouth daily.    . Turmeric 500 MG CAPS Take by mouth.    . valACYclovir (VALTREX) 1000 MG tablet Take 1 tablet by mouth as needed.     . venlafaxine XR (EFFEXOR-XR) 150 MG 24 hr capsule Take 150 mg by mouth daily.  1  . zolpidem (AMBIEN) 10 MG tablet TK 1 T PO HS PRN  0  . hydrochlorothiazide (HYDRODIURIL) 12.5 MG tablet Take 1 tablet (12.5 mg total) by mouth daily. 90 tablet 3   No current facility-administered medications for this visit.     Allergies:   Dye fdc red 3 (erythrosine)     Social History:  The patient  reports that she has quit smoking. She has never used smokeless tobacco. She reports that she drinks alcohol. She reports that she does not use drugs.   Family History:  The patient's family history includes AAA (abdominal aortic aneurysm) in her father; Atrial fibrillation in her mother; CAD in her brother; COPD in her brother, brother, father, and mother; Diabetes in her brother, maternal grandfather, maternal grandmother, and paternal grandmother; Heart disease in her father, maternal grandfather, maternal grandmother, and paternal grandfather; Heart failure in her father and mother; Kidney disease in her mother; Kidney failure in her mother; Peripheral Artery Disease in her brother; Stroke in her brother and paternal grandmother; Testicular cancer (age of onset: 90) in her son.    ROS:  Please see the history of present illness.   Otherwise, review of systems are positive for none.   All other systems are reviewed and negative.    PHYSICAL EXAM: VS:  BP (!) 156/79   Pulse 60   Ht 5\' 6"  (1.676 m)   Wt 287 lb 12.8 oz (130.5 kg)   BMI 46.45 kg/m  , BMI Body mass index is 46.45 kg/m. GENERAL:  Well appearing HEENT: Pupils equal round and reactive, fundi not visualized, oral mucosa unremarkable NECK:  No jugular venous distention, waveform within normal limits, carotid upstroke brisk and symmetric, no bruits LUNGS:  Clear to auscultation bilaterally HEART:  RRR.  PMI not displaced or sustained,S1 and S2 within normal limits, no S3, no S4, no clicks, no rubs, no murmurs ABD:  Flat, positive bowel sounds normal in frequency in pitch, no bruits, no rebound, no guarding, no midline pulsatile mass, no hepatomegaly, no splenomegaly EXT:  2 plus pulses throughout, no edema, no cyanosis no clubbing SKIN:  No rashes no nodules NEURO:  Cranial nerves II through XII grossly intact, motor grossly intact throughout PSYCH:  Cognitively intact, oriented to person place  and time   EKG:  EKG is not ordered today. The ekg ordered 03/31/16 demonstrates sinus rhythm rate 75 bpm.  05/04/17: Sinus rhythm.  Rate 76 bpm.   11 Day Event Monitor 04/03/16:  Quality: Fair.  Baseline artifact. Predominant rhythm: sinus rhythm  PVCs, PACs and atrial runs were noted  Echo 04/12/17: Study Conclusions  - Left ventricle: The cavity size was normal. Systolic function was   normal. The estimated ejection fraction was in the range of 55%   to 60%. Wall motion was normal; there were no regional wall   motion abnormalities. Doppler parameters are consistent with both   elevated ventricular  end-diastolic filling pressure and elevated   left atrial filling pressure. - Aortic valve: There was mild regurgitation. - Mitral valve: There was mild regurgitation. - Left atrium: The atrium was moderately dilated. - Atrial septum: A patent foramen ovale cannot be excluded.  Lexiscan Myoview 04/2017:  Probable normal perfusion and mild soft tissue attenuation (breast) No significant ischemia or scar.  LVEF 55%  This is a low risk study.  Recent Labs: No results found for requested labs within last 8760 hours.   02/25/16: WBC 10.1, hemoglobin 12.1, hematocrit 36.2, platelets 319 Sodium 143, potassium 4.5, BUN 24, creatinine 1.05 AST 27, ALT 24 TSH 0.886 D-dimer 0.68 Troponin I less than 0.01 Total cholesterol 262, triglycerides 131, HDL 75, LDL 161  05/2017: Sodium 142, potassium 3.6, BUN 14, creatinine 0.88 Total cholesterol 233, triglycerides 99, HDL 75, LDL 139  Lipid Panel No results found for: CHOL, TRIG, HDL, CHOLHDL, VLDL, LDLCALC, LDLDIRECT    Wt Readings from Last 3 Encounters:  09/20/17 287 lb 12.8 oz (130.5 kg)  05/16/17 270 lb (122.5 kg)  05/04/17 270 lb (122.5 kg)      ASSESSMENT AND PLAN:  # Shortness of breath: Lexiscan Myoview was negative for ischemia.  She has no evidence of heart failure.  I suspect this is due to morbid obesity,  deconditioning, and lack of physical activity.  I recommended that she work on increasing her exercise to 150 minutes/week.  # Palpitation: Improved on metoprolol.   # Hypertension:  Blood pressure is above goal.  Continue metoprolol and add hydrochlorthiazide 12.5 mg daily.  Her goal is less than 130/80.  Check BMP in 2 weeks.   # Hyperlipidemia: LDL was 139 on 05/2017.  We will increase atorvastatin to 40 mg.  She will need repeat lipids and a CMP  when she returns for follow-up with pharmacy.   Current medicines are reviewed at length with the patient today.  The patient does not have concerns regarding medicines.   Labs/ tests ordered today include:   Orders Placed This Encounter  Procedures  . Basic metabolic panel     Disposition:   FU with Kimberly Macaluso C. Duke Salviaandolph, MD, [facility] in [facility] months.  PharmD in 2 months for BP control.     Signed, Salmaan Patchin C. Duke Salviaandolph, MD, [facility]  09/20/2017 9:09 AM    [facility]

## 2017-09-20 NOTE — Patient Instructions (Addendum)
Medication Instructions: START HYDROCHLOROTHIAZIDE 12.5 MG DAILY.  INCREASE YOUR ATORVASTATIN TO 40 MG DAILY   Labwork: BMET IN 2 WEEKS   Testing/Procedures: NONE  Follow-Up: Your physician recommends that you schedule a follow-up appointment in: 2 MONTHS WITH PHARM D FOR BLOOD PRESSURE  Your physician wants you to follow-up in: 6 MONTH OV  You will receive a reminder letter in the mail two months in advance. If you don't receive a letter, please call our office to schedule the follow-up appointment.  If you need a refill on your cardiac medications before your next appointment, please call your pharmacy.

## 2017-11-21 ENCOUNTER — Ambulatory Visit: Payer: Managed Care, Other (non HMO)

## 2018-01-16 IMAGING — NM NM MISC PROCEDURE
9 series · 54 of 54 positions shown · non-contrast
Comparison: none

[Series 1: wbr_s-proj_st wbr stress-gsp · 6.40mm/px · 6 of 512 frames shown]
[frame 43/512]
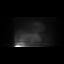
[frame 128/512]
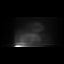
[frame 214/512]
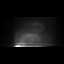
[frame 299/512]
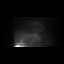
[frame 384/512]
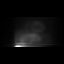
[frame 470/512]
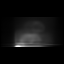

[Series 1: wbr stress-gsp · 6.40mm/px · 6 of 512 frames shown]
[frame 43/512]
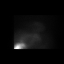
[frame 128/512]
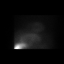
[frame 214/512]
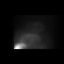
[frame 299/512]
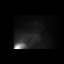
[frame 384/512]
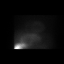
[frame 470/512]
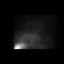

[Series 1: stress sax gs · 6.4mm · 6.40mm/px · 6 of 176 frames shown]
[frame 15/176]
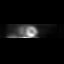
[frame 44/176]
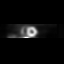
[frame 74/176]
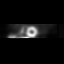
[frame 103/176]
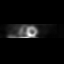
[frame 132/176]
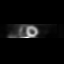
[frame 162/176]
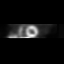

[Series 1: stress sax · 6.4mm · 6.40mm/px · 6 of 22 frames shown]
[frame 2/22]
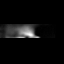
[frame 6/22]
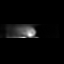
[frame 10/22]
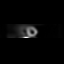
[frame 13/22]
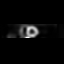
[frame 17/22]
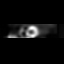
[frame 21/22]
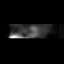

[Series 2: wbr stress-sum-em · 6.40mm/px · 6 of 64 frames shown]
[frame 6/64]
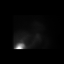
[frame 16/64]
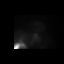
[frame 27/64]
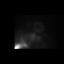
[frame 38/64]
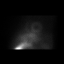
[frame 48/64]
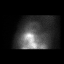
[frame 59/64]
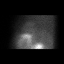

[Series 2: wbr_s-proj_st wbr stress-sum-em · 6.40mm/px · 6 of 64 frames shown]
[frame 6/64]
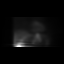
[frame 16/64]
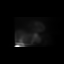
[frame 27/64]
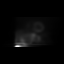
[frame 38/64]
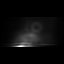
[frame 48/64]
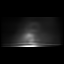
[frame 59/64]
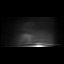

[Series 3: rest sax · 6.4mm · 6.40mm/px · 6 of 21 frames shown]
[frame 2/21]
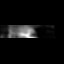
[frame 6/21]
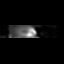
[frame 9/21]
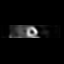
[frame 13/21]
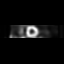
[frame 16/21]
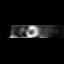
[frame 20/21]
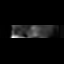

[Series 3: wbr rest · 6.40mm/px · 6 of 64 frames shown]
[frame 6/64]
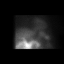
[frame 16/64]
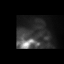
[frame 27/64]
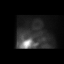
[frame 38/64]
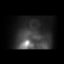
[frame 48/64]
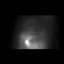
[frame 59/64]
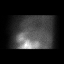

[Series 3: wbr_r-proj_st wbr rest · 6.40mm/px · 6 of 64 frames shown]
[frame 6/64]
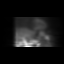
[frame 16/64]
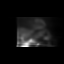
[frame 27/64]
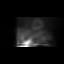
[frame 38/64]
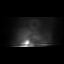
[frame 48/64]
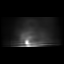
[frame 59/64]
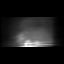

[54 of 54 positions shown; findings below may reference images not displayed]

Canned report from images found in remote index.

Refer to host system for actual result text.

## 2018-05-03 ENCOUNTER — Ambulatory Visit: Payer: Managed Care, Other (non HMO) | Admitting: Cardiovascular Disease

## 2018-07-08 ENCOUNTER — Other Ambulatory Visit: Payer: Self-pay | Admitting: *Deleted

## 2018-07-08 MED ORDER — METOPROLOL TARTRATE 25 MG PO TABS: 25.0000 mg | ORAL_TABLET | Freq: Two times a day (BID) | ORAL | 0 refills | Status: DC

## 2018-08-16 ENCOUNTER — Other Ambulatory Visit: Payer: Self-pay | Admitting: Gastroenterology

## 2018-08-16 DIAGNOSIS — Z86010 Personal history of colonic polyps: Secondary | ICD-10-CM

## 2018-09-08 ENCOUNTER — Other Ambulatory Visit: Payer: Self-pay | Admitting: Cardiovascular Disease

## 2018-10-23 ENCOUNTER — Other Ambulatory Visit: Payer: Self-pay | Admitting: Cardiovascular Disease

## 2018-10-23 NOTE — Telephone Encounter (Signed)
Lopressor 25 mg refilled 

## 2018-11-14 ENCOUNTER — Other Ambulatory Visit: Payer: Self-pay | Admitting: Cardiovascular Disease

## 2018-12-29 ENCOUNTER — Other Ambulatory Visit: Payer: Self-pay | Admitting: Cardiovascular Disease

## 2019-01-25 ENCOUNTER — Other Ambulatory Visit: Payer: Self-pay | Admitting: Cardiovascular Disease

## 2019-01-28 ENCOUNTER — Telehealth: Payer: Self-pay | Admitting: Cardiovascular Disease

## 2019-01-28 NOTE — Telephone Encounter (Signed)
Pt overdue for 6 month f/u. Please contact pt for future appointment. Pt needing refills. 

## 2019-01-28 NOTE — Telephone Encounter (Signed)
LVM for patient to schedule followup with Dr. Oval Linsey.

## 2019-01-29 NOTE — Telephone Encounter (Signed)
Rx(s) sent to pharmacy electronically.  

## 2019-02-17 ENCOUNTER — Other Ambulatory Visit: Payer: Self-pay | Admitting: Cardiovascular Disease

## 2019-03-02 ENCOUNTER — Other Ambulatory Visit: Payer: Self-pay | Admitting: Cardiovascular Disease

## 2019-03-16 ENCOUNTER — Other Ambulatory Visit: Payer: Self-pay | Admitting: Cardiovascular Disease

## 2019-03-17 NOTE — Telephone Encounter (Signed)
She is scheduled to see me 04/2019.  Please refill through then

## 2019-05-07 ENCOUNTER — Other Ambulatory Visit: Payer: Self-pay | Admitting: Obstetrics and Gynecology

## 2019-05-07 ENCOUNTER — Telehealth: Payer: Self-pay

## 2019-05-07 NOTE — Telephone Encounter (Signed)
   [facility] Pre-operative Risk Assessment    Request for surgical clearance:  1. What type of surgery is being performed? Anterior and Posterior Colporrhaphy and TVT    2. When is this surgery scheduled? 06/23/19  3. What type of clearance is required (medical clearance vs. Pharmacy clearance to hold med vs. Both)? Medical  4. Are there any medications that need to be held prior to surgery and how long?    5. Practice name and name of physician performing surgery? Dr. [facility]   6. What is your office phone number [phone]    7.   What is your office fax number [phone]  8.   Anesthesia type (None, local, MAC, general) ?    Rhetta Mura 05/07/2019, 4:47 PM  _________________________________________________________________   (provider comments below)

## 2019-05-08 NOTE — Telephone Encounter (Signed)
Left message to call the office to schedule an appt for surgery clearance with Dr.Myrtle [place] at our [facility] office.

## 2019-05-08 NOTE — Telephone Encounter (Signed)
Pt has appt with Dr. Oval Linsey 05/13/19. I will route clearance notes to MD for appt. I will remove from the pre op call back pool.

## 2019-05-08 NOTE — Telephone Encounter (Signed)
   Primary [place], MD  Chart reviewed as part of pre-operative protocol coverage. Because of Kimberly Castro past medical history and time since last visit, he/she will require a follow-up visit in order to better assess preoperative cardiovascular risk.  Pre-op covering staff: - Please schedule appointment and call patient to inform them. - Please contact requesting surgeon's office via preferred method (i.e, phone, fax) to inform them of need for appointment prior to surgery.  If applicable, this message will also be routed to pharmacy pool and/or primary cardiologist for input on holding anticoagulant/antiplatelet agent as requested below so that this information is available at time of patient's appointment.   Almyra Deforest, [place]  05/08/2019, 11:33 AM

## 2019-05-09 ENCOUNTER — Other Ambulatory Visit: Payer: Self-pay | Admitting: Cardiovascular Disease

## 2019-05-13 ENCOUNTER — Encounter: Payer: Self-pay | Admitting: Cardiovascular Disease

## 2019-05-13 ENCOUNTER — Ambulatory Visit (INDEPENDENT_AMBULATORY_CARE_PROVIDER_SITE_OTHER): Payer: Managed Care, Other (non HMO) | Admitting: Cardiovascular Disease

## 2019-05-13 ENCOUNTER — Other Ambulatory Visit: Payer: Self-pay

## 2019-05-13 ENCOUNTER — Telehealth (HOSPITAL_COMMUNITY): Payer: Self-pay

## 2019-05-13 VITALS — BP 157/79 | HR 67 | Ht 66.0 in | Wt 338.4 lb

## 2019-05-13 DIAGNOSIS — R0989 Other specified symptoms and signs involving the circulatory and respiratory systems: Secondary | ICD-10-CM | POA: Diagnosis not present

## 2019-05-13 DIAGNOSIS — I10 Essential (primary) hypertension: Secondary | ICD-10-CM

## 2019-05-13 DIAGNOSIS — E7800 Pure hypercholesterolemia, unspecified: Secondary | ICD-10-CM

## 2019-05-13 DIAGNOSIS — R0602 Shortness of breath: Secondary | ICD-10-CM

## 2019-05-13 DIAGNOSIS — Z5181 Encounter for therapeutic drug level monitoring: Secondary | ICD-10-CM

## 2019-05-13 NOTE — Telephone Encounter (Signed)
Instructions left on the patient's answering machine. Asked to call back with any questions. S.Amedeo Detweiler EMTP 

## 2019-05-13 NOTE — Patient Instructions (Addendum)
Medication Instructions:  Your physician recommends that you continue on your current medications as directed. Please refer to the Current Medication list given to you today.  *If you need a refill on your cardiac medications before your next appointment, please call your pharmacy*  Lab Work: [facility]/CEMT/CBC/TSH/FT4/MAGNESIUM  If you have labs (blood work) drawn today and your tests are completely normal, you will receive your results only by: Marland Kitchen MyChart Message (if you have MyChart) OR . A paper copy in the mail If you have any lab test that is abnormal or we need to change your treatment, we will call you to review the results.  Testing/Procedures: Your physician has requested that you have an echocardiogram. Echocardiography is a painless test that uses sound waves to create images of your heart. It provides your doctor with information about the size and shape of your heart and how well your heart's chambers and valves are working. This procedure takes approximately one hour. There are no restrictions for this procedure.  Your physician has requested that you have a lexiscan myoview. For further information please visit [email or web address]. Please follow instruction sheet, as given. 2 DAY STUDY   BOTH OF THE ABOVE WITH BE DONE AT [facility] AT [place] [address]  Your physician has requested that you have a carotid duplex. This test is an ultrasound of the carotid arteries in your neck. It looks at blood flow through these arteries that supply the brain with blood. Allow one hour for this exam. There are no restrictions or special instructions. [facility]   Follow-Up: At [facility], you and your health needs are our priority.  As part of our continuing mission to provide you with exceptional heart care, we have created designated Provider Care Teams.  These Care Teams include your primary Cardiologist (physician) and Advanced Practice Providers (APPs -   Physician Assistants and Nurse Practitioners) who all work together to provide you with the care you need, when you need it.  Your next appointment:   Your physician recommends that you schedule a follow-up appointment in: 1 MONTH WITH NP/PA, VIRTUAL OK   Your physician recommends that you schedule a follow-up appointment in: 3 MONTHS WITH DR Muse  Other Instructions   Cardiac Nuclear Scan A cardiac nuclear scan is a test that is done to check the flow of blood to your heart. It is done when you are resting and when you are exercising. The test looks for problems such as:  Not enough blood reaching a portion of the heart.  The heart muscle not working as it should. You may need this test if:  You have heart disease.  You have had lab results that are not normal.  You have had heart surgery or a balloon procedure to open up blocked arteries (angioplasty).  You have chest pain.  You have shortness of breath. In this test, a special dye (tracer) is put into your bloodstream. The tracer will travel to your heart. A camera will then take pictures of your heart to see how the tracer moves through your heart. This test is usually done at a hospital and takes 2-4 hours. Tell a doctor about:  Any allergies you have.  All medicines you are taking, including vitamins, herbs, eye drops, creams, and over-the-counter medicines.  Any problems you or family members have had with anesthetic medicines.  Any blood disorders you have.  Any surgeries you have had.  Any medical conditions you have.  Whether you are pregnant or may be pregnant. What are the risks? Generally, this is a safe test. However, problems may occur, such as:  Serious chest pain and heart attack. This is only a risk if the stress portion of the test is done.  Rapid heartbeat.  A feeling of warmth in your chest. This feeling usually does not last long.  Allergic reaction to the  tracer. What happens before the test?  Ask your doctor about changing or stopping your normal medicines. This is important.  Follow instructions from your doctor about what you cannot eat or drink.  Remove your jewelry on the day of the test. What happens during the test?  An IV tube will be inserted into one of your veins.  Your doctor will give you a small amount of tracer through the IV tube.  You will wait for 20-40 minutes while the tracer moves through your bloodstream.  Your heart will be monitored with an electrocardiogram (ECG).  You will lie down on an exam table.  Pictures of your heart will be taken for about 15-20 minutes.  You may also have a stress test. For this test, one of these things may be done: ? You will be asked to exercise on a treadmill or a stationary bike. ? You will be given medicines that will make your heart work harder. This is done if you are unable to exercise.  When blood flow to your heart has peaked, a tracer will again be given through the IV tube.  After 20-40 minutes, you will get back on the exam table. More pictures will be taken of your heart.  Depending on the tracer that is used, more pictures may need to be taken 3-4 hours later.  Your IV tube will be removed when the test is over. The test may vary among doctors and hospitals. What happens after the test?  Ask your doctor: ? Whether you can return to your normal schedule, including diet, activities, and medicines. ? Whether you should drink more fluids. This will help to remove the tracer from your body. Drink enough fluid to keep your pee (urine) pale yellow.  Ask your doctor, or the department that is doing the test: ? When will my results be ready? ? How will I get my results? Summary  A cardiac nuclear scan is a test that is done to check the flow of blood to your heart.  Tell your doctor whether you are pregnant or may be pregnant.  Before the test, ask your doctor  about changing or stopping your normal medicines. This is important.  Ask your doctor whether you can return to your normal activities. You may be asked to drink more fluids. This information is not intended to replace advice given to you by your health care provider. Make sure you discuss any questions you have with your health care provider. Document Released: 11/19/2017 Document Revised: 09/25/2018 Document Reviewed: 11/19/2017 Elsevier Patient Education  2020 [employer]  Echocardiogram An echocardiogram is a procedure that uses painless sound waves (ultrasound) to produce an image of the heart. Images from an echocardiogram can provide important information about:  Signs of coronary artery disease (CAD).  Aneurysm detection. An aneurysm is a weak or damaged part of an artery wall that bulges out from the normal force of blood pumping through the body.  Heart size and shape. Changes in the size or shape of the heart can be associated with  certain conditions, including heart failure, aneurysm, and CAD.  Heart muscle function.  Heart valve function.  Signs of a past heart attack.  Fluid buildup around the heart.  Thickening of the heart muscle.  A tumor or infectious growth around the heart valves. Tell a health care provider about:  Any allergies you have.  All medicines you are taking, including vitamins, herbs, eye drops, creams, and over-the-counter medicines.  Any blood disorders you have.  Any surgeries you have had.  Any medical conditions you have.  Whether you are pregnant or may be pregnant. What are the risks? Generally, this is a safe procedure. However, problems may occur, including:  Allergic reaction to dye (contrast) that may be used during the procedure. What happens before the procedure? No specific preparation is needed. You may eat and drink normally. What happens during the procedure?   An IV tube may be inserted into one of your veins.   You may receive contrast through this tube. A contrast is an injection that improves the quality of the pictures from your heart.  A gel will be applied to your chest.  A wand-like tool (transducer) will be moved over your chest. The gel will help to transmit the sound waves from the transducer.  The sound waves will harmlessly bounce off of your heart to allow the heart images to be captured in real-time motion. The images will be recorded on a computer. The procedure may vary among health care providers and hospitals. What happens after the procedure?  You may return to your normal, everyday life, including diet, activities, and medicines, unless your health care provider tells you not to do that. Summary  An echocardiogram is a procedure that uses painless sound waves (ultrasound) to produce an image of the heart.  Images from an echocardiogram can provide important information about the size and shape of your heart, heart muscle function, heart valve function, and fluid buildup around your heart.  You do not need to do anything to prepare before this procedure. You may eat and drink normally.  After the echocardiogram is completed, you may return to your normal, everyday life, unless your health care provider tells you not to do that. This information is not intended to replace advice given to you by your health care provider. Make sure you discuss any questions you have with your health care provider. Document Released: 06/02/2000 Document Revised: 09/26/2018 Document Reviewed: 07/08/2016 Elsevier Patient Education  2020 [employer].

## 2019-05-13 NOTE — Progress Notes (Signed)
Cardiology Office Note   Date:  05/13/2019   ID:  Kimberly Castro, DOB Jul 16, 1960, MRN [id number]  PCP:  Sigmund Hazel, MD  Cardiologist:   Chilton Si, MD   No chief complaint on file.    History of Present Illness: Kimberly Castro is a 58 y.o. female with OSA on CPAP, hyperlipidemia, hypertension and fibromyalgia who presents for follow-up.  She was first seen 05-03-16 for an evaluation of palpitations.  She was referred for 14-day event monitor that showed PACs and PVCs.  She had laboratory testing that revealed normal blood counts and normal electrolytes. 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  Her thyroid function was normal. D-dimer was mildly elevated but she had a subsequent CT angiogram of Kimberly chest that was negative for PE.  She had an echo 05/03/2017 that revealed LVEF 55-60%.  At her last appointment she reported exertional dyspnea.  She went for [place] 04/2017 that revealed LVEF 55% and breast attenuation artifact but no ischemia.  At her last appointment Kimberly Castro was doing well physically but was under a lot of stress.  Her mother died 03/03/17 and then her brother died 05-03-2017.  She recently found out that her other brother has a tumor on his thyroid.  Her blood pressure was poorly controlled.  Hydrochlorothiazide was added to her regimen.  It was also recommended that she increase her exercise to at least 150 minutes weekly.  Lately she has been feeling very poorly.  She has struggled with recurrent UTIs and is scheduled to have repair of her cystocele and rectocele in January.  She is struggling with a lot of fatigue and shortness of breath.  This has been ongoing since July.  1 day it was so bad that she felt like she was going to collapse when walking out of work.  She also gets very short of breath when walking upstairs.  She tried to walk to our office from downstairs and did not know she can make it.  She has no chest pain  or orthopnea.  Her lower extremity edema is stable.  She checks her BP at home and it is typically around 140/60-70.  She has been under a lot of stress lately.  Her sister had a stroke two months ago at age 25.  She has residual visual loss.  She was found to have a cerebral aneurysm. Her father was a heavy smoker and had a AAA.  She hasn't been getting any exercise lately.  She just hasn't had any energy.  She has been feeling poorly since July.   Past Medical History:  Diagnosis Date  . ADD (attention deficit disorder)   . Chronic fatigue   . Depression   . Fibromyalgia   . Hypertension   . Palpitations 03/31/2016  . Sleep apnea     Past Surgical History:  Procedure Laterality Date  . GASTRIC BYPASS    . LAPAROSCOPY N/A 08/12/2016   Procedure: LAPAROSCOPY DIAGNOSTIC, POSSIBLE LAPAROTOMY, POSSIBLE BOWEL RESECTION;  Surgeon: Glenna Fellows, MD;  Location: [facility] OR;  Service: General;  Laterality: N/A;  . PARTIAL HYSTERECTOMY     still has ovaries  . TONSILLECTOMY    . WISDOM TOOTH EXTRACTION       Current Outpatient Medications  Medication Sig Dispense Refill  . amphetamine-dextroamphetamine (ADDERALL XR) 30 MG 24 hr capsule 1    . atorvastatin (LIPITOR) 40 MG tablet TAKE 1 TABLET BY MOUTH DAILY  90 tablet 1  . Biotin 1 MG CAPS Take by mouth daily.    . calcium citrate (CALCITRATE - DOSED IN MG ELEMENTAL CALCIUM) 950 MG tablet Take 630 mg of elemental calcium by mouth daily.    . cetirizine (ZYRTEC) 10 MG tablet Take 10 mg by mouth daily.    . clonazePAM (KLONOPIN) 0.5 MG tablet Take 0.5 mg by mouth 2 (two) times daily as needed for anxiety.    . Cranberry 400 MG CAPS Take 4,200 mg by mouth. Takes 3 a day    . Cyanocobalamin (GNP VITAMIN B-12) 1000 MCG TBCR 1 tablet    . Eluxadoline (VIBERZI) 75 MG TABS Take 75 mg daily by mouth.    . fluticasone (FLONASE) 50 MCG/ACT nasal spray Place 1 spray into both nostrils as needed.  1  . hydrochlorothiazide (HYDRODIURIL) 12.5 MG tablet  TAKE 1 TABLET(12.5 MG) BY MOUTH DAILY 60 tablet 0  . metoprolol tartrate (LOPRESSOR) 25 MG tablet TAKE 1 TABLET(25 MG) BY MOUTH TWICE DAILY 180 tablet 3  . Multiple Vitamin (MULTIVITAMIN) tablet Take 1 tablet by mouth daily.    . Turmeric 500 MG CAPS Take by mouth.    . valACYclovir (VALTREX) 1000 MG tablet Take 1 tablet by mouth as needed.     . venlafaxine XR (EFFEXOR-XR) 150 MG 24 hr capsule Take 150 mg by mouth daily.  1  . zolpidem (AMBIEN) 10 MG tablet TK 1 T PO HS PRN  0   No current facility-administered medications for this visit.     Allergies:   Food color pink    Social History:  Kimberly patient  reports that she has quit smoking. She has never used smokeless tobacco. She reports current alcohol use. She reports that she does not use drugs.   Castro History:  Kimberly Castro history includes AAA (abdominal aortic aneurysm) in her father; Atrial fibrillation in her mother; CAD in her brother; COPD in her brother, brother, father, and mother; Diabetes in her brother, maternal grandfather, maternal grandmother, and paternal grandmother; Heart disease in her father, maternal grandfather, maternal grandmother, and paternal grandfather; Heart failure in her father and mother; Kidney disease in her mother; Kidney failure in her mother; Peripheral Artery Disease in her brother; Stroke in her brother and paternal grandmother; Testicular cancer (age of onset: 28) in her son.    ROS:  Please see Kimberly history of present illness.   Otherwise, review of systems are positive for none.   All other systems are reviewed and negative.    PHYSICAL EXAM: VS:  BP (!) 157/79   Pulse 67   Ht 5\' 6"  (1.676 m)   Wt (!) 338 lb 6.4 oz (153.5 kg)   SpO2 96%   BMI 54.62 kg/m  , BMI Body mass index is 54.62 kg/m. GENERAL:  Fatigued appearing HEENT: Pupils equal round and reactive, fundi not visualized, oral mucosa unremarkable NECK:  No jugular venous distention, waveform within normal limits, carotid  upstroke brisk and symmetric, mild R carotid bruits, no thyromegaly LYMPHATICS:  No cervical adenopathy LUNGS:  Clear to auscultation bilaterally HEART:  RRR.  PMI not displaced or sustained,S1 and S2 within normal limits, no S3, no S4, no clicks, no rubs, II/VI systolic murmurs ABD:  Flat, positive bowel sounds normal in frequency in pitch, no bruits, no rebound, no guarding, no midline pulsatile mass, no hepatomegaly, no splenomegaly EXT:  2 plus pulses throughout, no edema, no cyanosis no clubbing SKIN:  No rashes no nodules NEURO:  Cranial nerves II through XII grossly intact, motor grossly intact throughout PSYCH:  Cognitively intact, oriented to person place and time    EKG:  EKG is not ordered today. Kimberly ekg ordered 03/31/16 demonstrates sinus rhythm rate 75 bpm.  05/04/17: Sinus rhythm.  Rate 76 bpm.  05/13/19: Sinus rhythm.  Rate 67 bpm.    11 Day Event Monitor 04/03/16:  Quality: Fair.  Baseline artifact. Predominant rhythm: sinus rhythm  PVCs, PACs and atrial runs were noted  Echo 04/12/17: Study Conclusions  - Left ventricle: Kimberly cavity size was normal. Systolic function was   normal. Kimberly estimated ejection fraction was in Kimberly range of 55%   to 60%. Wall motion was normal; there were no regional wall   motion abnormalities. Doppler parameters are consistent with both   elevated ventricular end-diastolic filling pressure and elevated   left atrial filling pressure. - Aortic valve: There was mild regurgitation. - Mitral valve: There was mild regurgitation. - Left atrium: Kimberly atrium was moderately dilated. - Atrial septum: A patent foramen ovale cannot be excluded.  Lexiscan Myoview 04/2017:  Probable normal perfusion and mild soft tissue attenuation (breast) No significant ischemia or scar.  LVEF 55%  This is a low risk study.  Recent Labs: No results found for requested labs within last 8760 hours.   02/25/16: WBC 10.1, hemoglobin 12.1, hematocrit 36.2,  platelets 319 Sodium 143, potassium 4.5, BUN 24, creatinine 1.05 AST 27, ALT 24 TSH 0.886 D-dimer 0.68 Troponin I less than 0.01 Total cholesterol 262, triglycerides 131, HDL 75, LDL 161  05/2017: Sodium 142, potassium 3.6, BUN 14, creatinine 0.88 Total cholesterol 233, triglycerides 99, HDL 75, LDL 139  Lipid Panel No results found for: CHOL, TRIG, HDL, CHOLHDL, VLDL, LDLCALC, LDLDIRECT    Wt Readings from Last 3 Encounters:  05/13/19 (!) 338 lb 6.4 oz (153.5 kg)  09/20/17 287 lb 12.8 oz (130.5 kg)  05/16/17 270 lb (122.5 kg)      ASSESSMENT AND PLAN:  # Shortness of breath: Shortness of breath seems to be getting worse.  It may be due to obesity and deconditioning but it seems extreme.  We will get an echo and Lexiscan Myoview.  She also didn't get lab work after starting HCTZ.  I am concerned for electrolyte abnormality.  Check CMP, magnesium and TSH/free T4.  # Palpitation: Improved on metoprolol.   # Hypertension:  Blood pressure is above goal.  However she has near syncope and weakness.  She will track at home before we make changes.  Continue HCTZ and metoprolol for now.   # Hyperlipidemia: Check fasting lipids and CMP.  Continue atorvastatin.    Current medicines are reviewed at length with Kimberly patient today.  Kimberly patient does not have concerns regarding medicines.   Labs/ tests ordered today include:   Orders Placed This Encounter  Procedures  . CBC w/Diff  . T4, free  . TSH  . Lipid Profile  . Comprehensive Metabolic Panel (CMET)  . Magnesium  . Myocardial Perfusion Imaging  . EKG 12-Lead  . ECHOCARDIOGRAM COMPLETE  . Carotid     Disposition:   FU with Jaquise Faux C. Duke Salviaandolph, MD, [facility] in 3 months. APP in 1 month for BP control.     Signed, Akili Cuda C. Duke Salviaandolph, MD, [facility]  05/13/2019 5:14 PM    [facility]

## 2019-05-14 ENCOUNTER — Encounter (HOSPITAL_COMMUNITY): Payer: Managed Care, Other (non HMO)

## 2019-05-14 ENCOUNTER — Ambulatory Visit (HOSPITAL_COMMUNITY)
Admission: RE | Admit: 2019-05-14 | Discharge: 2019-05-14 | Disposition: A | Discharge status: Home / Self Care | Payer: Managed Care, Other (non HMO) | Source: Ambulatory Visit | Attending: Cardiovascular Disease | Admitting: Cardiovascular Disease

## 2019-05-14 DIAGNOSIS — I10 Essential (primary) hypertension: Secondary | ICD-10-CM | POA: Diagnosis not present

## 2019-05-14 DIAGNOSIS — R0989 Other specified symptoms and signs involving the circulatory and respiratory systems: Secondary | ICD-10-CM | POA: Insufficient documentation

## 2019-05-14 LAB — COMPREHENSIVE METABOLIC PANEL WITH GFR
ALT: 25 IU/L (ref 0–32)
AST: 28 IU/L (ref 0–40)
Albumin/Globulin Ratio: 1.7 (ref 1.2–2.2)
Albumin: 4.3 g/dL (ref 3.8–4.9)
Alkaline Phosphatase: 138 IU/L — ABNORMAL HIGH (ref 39–117)
BUN/Creatinine Ratio: 20 (ref 9–23)
BUN: 17 mg/dL (ref 6–24)
Bilirubin Total: 0.3 mg/dL (ref 0.0–1.2)
CO2: 27 mmol/L (ref 20–29)
Calcium: 9.2 mg/dL (ref 8.7–10.2)
Chloride: 101 mmol/L (ref 96–106)
Creatinine, Ser: 0.85 mg/dL (ref 0.57–1.00)
GFR calc Af Amer: 88 mL/min/1.73
GFR calc non Af Amer: 76 mL/min/1.73
Globulin, Total: 2.5 g/dL (ref 1.5–4.5)
Glucose: 91 mg/dL (ref 65–99)
Potassium: 3.7 mmol/L (ref 3.5–5.2)
Sodium: 141 mmol/L (ref 134–144)
Total Protein: 6.8 g/dL (ref 6.0–8.5)

## 2019-05-14 LAB — CBC WITH DIFFERENTIAL/PLATELET
Basophils Absolute: 0.1 10*3/uL (ref 0.0–0.2)
Basos: 1 %
EOS (ABSOLUTE): 0.2 10*3/uL (ref 0.0–0.4)
Eos: 2 %
Hematocrit: 39.3 % (ref 34.0–46.6)
Hemoglobin: 12.7 g/dL (ref 11.1–15.9)
Immature Grans (Abs): 0 10*3/uL (ref 0.0–0.1)
Immature Granulocytes: 0 %
Lymphocytes Absolute: 2.5 10*3/uL (ref 0.7–3.1)
Lymphs: 27 %
MCH: 28.9 pg (ref 26.6–33.0)
MCHC: 32.3 g/dL (ref 31.5–35.7)
MCV: 90 fL (ref 79–97)
Monocytes Absolute: 0.7 10*3/uL (ref 0.1–0.9)
Monocytes: 8 %
Neutrophils Absolute: 5.5 10*3/uL (ref 1.4–7.0)
Neutrophils: 62 %
Platelets: 289 10*3/uL (ref 150–450)
RBC: 4.39 x10E6/uL (ref 3.77–5.28)
RDW: 13.4 % (ref 11.7–15.4)
WBC: 8.9 10*3/uL (ref 3.4–10.8)

## 2019-05-14 LAB — LIPID PANEL
Chol/HDL Ratio: 2.3 ratio (ref 0.0–4.4)
Cholesterol, Total: 192 mg/dL (ref 100–199)
HDL: 84 mg/dL
LDL Chol Calc (NIH): 93 mg/dL (ref 0–99)
Triglycerides: 85 mg/dL (ref 0–149)
VLDL Cholesterol Cal: 15 mg/dL (ref 5–40)

## 2019-05-14 LAB — TSH: TSH: 1.04 u[IU]/mL (ref 0.450–4.500)

## 2019-05-14 LAB — MAGNESIUM: Magnesium: 1.7 mg/dL (ref 1.6–2.3)

## 2019-05-14 LAB — T4, FREE: Free T4: 1.17 ng/dL (ref 0.82–1.77)

## 2019-05-19 ENCOUNTER — Ambulatory Visit (HOSPITAL_COMMUNITY): Payer: Managed Care, Other (non HMO)

## 2019-05-19 ENCOUNTER — Telehealth: Payer: Self-pay | Admitting: Cardiovascular Disease

## 2019-05-19 NOTE — Telephone Encounter (Signed)
New Message    Pt called about her Stress test next week and is needing instructions for restrictions. She says she is at work and if someone could leave her a detailed message on her mobile number.    Please call

## 2019-05-19 NOTE — Telephone Encounter (Signed)
Left message no COVID needed and reviewed letter information. Did print letter and mail to patient

## 2019-05-19 NOTE — Telephone Encounter (Signed)
I tried to look up the dot phrase- but it did not pull up.  Will route to Primary nurse.  I am not sure of any other restrictions did she need a COVID test for treadmill?

## 2019-05-20 ENCOUNTER — Ambulatory Visit (HOSPITAL_COMMUNITY): Payer: Managed Care, Other (non HMO)

## 2019-05-23 ENCOUNTER — Telehealth (HOSPITAL_COMMUNITY): Payer: Self-pay | Admitting: *Deleted

## 2019-05-23 ENCOUNTER — Telehealth: Payer: Self-pay | Admitting: *Deleted

## 2019-05-23 DIAGNOSIS — E7800 Pure hypercholesterolemia, unspecified: Secondary | ICD-10-CM

## 2019-05-23 DIAGNOSIS — I10 Essential (primary) hypertension: Secondary | ICD-10-CM

## 2019-05-23 DIAGNOSIS — Z5181 Encounter for therapeutic drug level monitoring: Secondary | ICD-10-CM

## 2019-05-23 MED ORDER — ATORVASTATIN CALCIUM 80 MG PO TABS: 80.0000 mg | ORAL_TABLET | Freq: Every day | ORAL | 3 refills | Status: DC

## 2019-05-23 NOTE — Telephone Encounter (Signed)
Advised patient, verbalized understanding. Orders placed and Rx sent to pharmacy

## 2019-05-23 NOTE — Telephone Encounter (Signed)
-----   Message from Skeet Latch, MD sent at 05/20/2019  1:47 PM EST ----- LDL should be <70 given carotid stenosis.  Increase atorvastatin to 80mg .  Repeat lipids/CMP in 3 months. Labs otherwise normal.

## 2019-05-23 NOTE — Telephone Encounter (Signed)
Left message on voicemail per DPR in reference to upcoming appointment scheduled on 05/26/19 at 7:45 with detailed instructions given per Myocardial Perfusion Study Information Sheet for the test. LM to arrive 15 minutes early, and that it is imperative to arrive on time for appointment to keep from having the test rescheduled. If you need to cancel or reschedule your appointment, please call the office within 24 hours of your appointment. Failure to do so may result in a cancellation of your appointment, and a $50 no show fee. Phone number given for call back for any questions.

## 2019-05-26 ENCOUNTER — Ambulatory Visit (HOSPITAL_COMMUNITY): Payer: Managed Care, Other (non HMO) | Attending: Cardiology

## 2019-05-26 ENCOUNTER — Other Ambulatory Visit: Payer: Self-pay

## 2019-05-26 DIAGNOSIS — I10 Essential (primary) hypertension: Secondary | ICD-10-CM | POA: Diagnosis not present

## 2019-05-26 DIAGNOSIS — R0602 Shortness of breath: Secondary | ICD-10-CM | POA: Insufficient documentation

## 2019-05-26 MED ORDER — REGADENOSON 0.4 MG/5ML IV SOLN
0.4000 mg | Freq: Once | INTRAVENOUS | Status: AC
  Administered 2019-05-26: 0.4 mg via INTRAVENOUS

## 2019-05-26 MED ORDER — TECHNETIUM TC 99M TETROFOSMIN IV KIT
32.4000 | PACK | Freq: Once | INTRAVENOUS | Status: AC | PRN
  Administered 2019-05-26: 32.4 via INTRAVENOUS
  Filled 2019-05-26: qty 33

## 2019-05-27 ENCOUNTER — Ambulatory Visit (HOSPITAL_COMMUNITY): Payer: Managed Care, Other (non HMO)

## 2019-05-28 ENCOUNTER — Other Ambulatory Visit: Payer: Self-pay

## 2019-05-28 ENCOUNTER — Ambulatory Visit (HOSPITAL_COMMUNITY): Payer: Managed Care, Other (non HMO)

## 2019-05-28 ENCOUNTER — Ambulatory Visit (HOSPITAL_COMMUNITY): Payer: Managed Care, Other (non HMO) | Attending: Internal Medicine

## 2019-05-28 DIAGNOSIS — R0602 Shortness of breath: Secondary | ICD-10-CM

## 2019-05-28 DIAGNOSIS — I10 Essential (primary) hypertension: Secondary | ICD-10-CM | POA: Diagnosis not present

## 2019-05-28 LAB — MYOCARDIAL PERFUSION IMAGING
LV dias vol: 106 mL (ref 46–106)
LV sys vol: 43 mL
Peak HR: 72 {beats}/min
Rest HR: 60 {beats}/min
SDS: 0
SRS: 0
SSS: 0
TID: 0.97

## 2019-05-28 MED ORDER — TECHNETIUM TC 99M TETROFOSMIN IV KIT
31.3000 | PACK | Freq: Once | INTRAVENOUS | Status: AC | PRN
  Administered 2019-05-28: 31.3 via INTRAVENOUS
  Filled 2019-05-28: qty 32

## 2019-05-29 ENCOUNTER — Ambulatory Visit: Payer: Managed Care, Other (non HMO)

## 2019-06-02 DIAGNOSIS — N816 Rectocele: Secondary | ICD-10-CM | POA: Diagnosis present

## 2019-06-02 DIAGNOSIS — N3946 Mixed incontinence: Secondary | ICD-10-CM | POA: Diagnosis present

## 2019-06-02 DIAGNOSIS — R339 Retention of urine, unspecified: Secondary | ICD-10-CM | POA: Diagnosis present

## 2019-06-02 DIAGNOSIS — N8110 Cystocele, unspecified: Secondary | ICD-10-CM | POA: Diagnosis present

## 2019-06-02 NOTE — H&P (Addendum)
Kimberly Castro is a 58 y.o.  female, P: 3-0-2-3, presents for anterior-posterior colporrhaphy because of mixed urinary incontinence and symptomatic cystocele/rectocele. Over the past 3 years the patient has had frequent urinary tract infections, stress urinary incontinence and urge incontinence symptoms. She was seen by a Urologist who placed her on overactive bladder medications and prophylactic antibiotics however her symptoms persisted. Over the past year her symptoms have worsened. The patient underwent [facility] Urodynamic evaluation that showed mixed urinary incontinence and urinary retention. She denies history of renal stones, gross hematuria, flank/pelvic pain, dysuria, vaginitis or dyspareunia. She does admit to nocturia and diarrhea due to IBS but responsive to medication. A review of both medical and surgical management options were given to the patient however she would like to proceed with surgical management of her cystocele/rectocele and urinary symptoms.  Past Medical History  OB History: G: 6;  P: 4-0-2-3; SVB: 1984, 1989 (prenature infant deceased), 75 and 62  GYN History: menarche: 59 YO;   Denies history of abnormal PAP smear.   Last PAP smear- 2019-normal with negative HPV  Medical History: Hypertension, Hypercholesterolemia, B-12 Deficiency, Colon Polyps, Fibromyalgia, ADD, Anxiety, Depression, Sleep Apnea (has C-PAP), IBS and Acne  Surgical History: 1979 Tonsillectomy;  1997 Hysterectomy; 2000 Gastric By-Pass Surgery and 2018 Bowel Obstruction Denies problems with anesthesia (though had problems awakening after colonoscopy) or history of blood transfusions  Family History: Heart Disease, Diabetes Mellitus, Renal Disease, COPD, Aortic Aneurysm and Atrial Fibrillation  Social History: Married and employed in Armed forces operational officer at [employer];    Former smoker and rare alcohol consumer   Medications: Atorvastatin 80 mg daily Buproprion HCL XL 150 mg daily Calcium  811 mg daily Certirzine 10 mg daily Clonezapam 0.5 mg  1/2 tablet bid prn Adderall ER 30 mg daily Flonase Nasal Spray 1 spray per nostril daily HCTZ 12.5 mg daily Metoprolol 25 mg daily Tazorac 0.05% Topical Cream twice a month Valacyclorvir 1 gram 2 po bid x 1 day prn Venlafaxine ER 225mg  extended relief daily Viberzi 75 mg bid Zolpidem 10 mg qhs prn  Allergies  Allergen Reactions  . Food Color Pink Swelling    Iv dye   Dilaudid makes her not feel well and would like to avoid it if possible.    Denies sensitivity to peanuts, shellfish, soy, latex or adhesives.   ROS: Admits to glasses, diarrhea due to IBS, right knee swelling and pain and benign skin lesions on legs,  but denies headache, vision changes, nasal congestion, dysphagia, tinnitus, dizziness, hoarseness, cough,  chest pain, shortness of breath, nausea, vomiting, constipation,  urinary frequency,  dysuria, hematuria, vaginitis symptoms, pelvic pain, easy bruising,  myalgias,  unexplained weight loss and except as is mentioned in the history of present illness, patient's review of systems is otherwise negative.     Physical Exam  Bp: 134/64;  P: 72 bpm;  R: 16  Temperature: 98.0 degrees F (temporal);   Weight: 342 lbs.  Height: 5'6" ;  BMI: 55.2  Neck: supple without masses or thyromegaly Lungs: clear to auscultation Heart: regular rate and rhythm Abdomen: soft, non-tender and no organomegaly Pelvic:EGBUS- wnl; vagina-normal rugae; uterus-cervix surgically absent, 3/4/ cystocele and rectocele; adnexae-no tenderness or masses Extremities:  no clubbing, cyanosis or edema   Assesment: Mixed Urinary Incontinence                      Cystocele  Rectocele                      Urinary Retention   Disposition:  A discussion was held with patient regarding the indication for her procedure(s) along with the risks,  reaction to anesthesia, damage to adjacent organs, infection, excessive bleeding,  erosion of tension free vaginal tape and worsening or no improvement in symptoms. . The patient verbalized understanding of these risks and has consented to proceed with an Anterior-Posterior Colporrhaphy, Placement of Tension Free Vaginal Tape and Cystoscopy at [facility] on June 23, 2019.  CSN# [id number]   El[place]ira J. Lowell Guitar, PA-C  for [employer]. Cathe Mons testing showed ISD as well.  R/B/A discussed in the office.  Pt denied having any further questions today and is ready to proceed.

## 2019-06-05 NOTE — Progress Notes (Signed)
Virtual Visit via Telephone Note   This visit type was conducted due to national recommendations for restrictions regarding the COVID-19 Pandemic (e.g. social distancing) in an effort to limit this patient's exposure and mitigate transmission in our community.  Due to her co-morbid illnesses, this patient is at least at moderate risk for complications without adequate follow up.  This format is felt to be most appropriate for this patient at this time.  The patient did not have access to video technology/had technical difficulties with video requiring transitioning to audio format only (telephone).  All issues noted in this document were discussed and addressed.  No physical exam could be performed with this format.  Please refer to the patient's chart for her  consent to telehealth for [facility]. Virtual platform was offered given ongoing worsening Covid-19 pandemic.  Date:  06/06/2019   ID:  Kimberly Castro, DOB 10/05/1960, MRN [id number]  Patient Location: Home Provider Location: [facility]  PCP:  Kathyrn Lass, MD  Cardiologist:  Skeet Latch, MD  Electrophysiologist:  None   Evaluation Performed:  Follow-Up Visit  Chief Complaint: follow-up of dyspnea and hypertension  History of Present Illness:    Kimberly Castro is a 58 y.o. female with a history of palpitations with PACs/PVCs noted on cardiac monitor in 03/2016, hypertension, hyperlipidemia, obstructive sleep apnea on CPAP, fibromyalgia, chronic fatigue, and depression who presents today for follow-up of stress.  Patient was first seen in 03/2016 for evaluation of palpitations. TSH and electrolytes normal at that time. 14-Day Event Monitor showed PACs and PVCs. At visit in 04/2017, she reported dyspnea on exertion. Therefore, [employer] was ordered which showed mild soft tissue breast attenuation but no significant ischemia or scar.   Patient was last seen by Dr. Oval Linsey on 05/13/2019 at which time she reported  feeling very poorly since July with a lot of fatigue and shortness of breath. She denied any chest pain. She reported recurrent UTIs and noted that she is planning on having cystocele and rectocele repair in 06/2019. BP was elevated but patient reported some near syncope and weakness so patient was instructed to monitor BP at home before medication changes were made. Electrolytes and TSH/free T4 were checked and were normal. Carotid bruit was noted on exam so carotid dopplers were ordered and showed mild stenosis bilaterally. Echo and Lexiscan Myoview were ordered for further evaluation of dyspnea. Echo showed LVEF of 61% with normal wall motion, mild LVH, grade 2 diastolic dysfunction, and elevated LVEDP. No significant valvular disease was noted. Lexiscan Myoview was low risk and showed no evidence of ischemia.  Patient presents today for follow-up via virtual visit. Patient continues to feel very fatigued. However, she states she thinks her breathing has improved some. No chest pain. No recent palpitations, lightheadedness, or dizziness. No recurrent near syncope. No orthopnea or PND if she is compliant with her CPAP. She notes some bilateral ankle edema when she has been sitting at her desk all day. She is watching her sodium intake. Her biggest complaint today is continued fatigue. She states she has had fatigue before with her fibromyalgia but states it has never been this bad. She has follow-up with PCP next month.   The patient does not have symptoms concerning for COVID-19 infection (fever, chills, cough, or new shortness of breath).    Past Medical History:  Diagnosis Date  . ADD (attention deficit disorder)   . Chronic fatigue   . Depression   . Fibromyalgia   . Hypertension   .  Palpitations 03/31/2016  . Sleep apnea    Past Surgical History:  Procedure Laterality Date  . GASTRIC BYPASS    . LAPAROSCOPY N/A 08/12/2016   Procedure: LAPAROSCOPY DIAGNOSTIC, POSSIBLE LAPAROTOMY, POSSIBLE  BOWEL RESECTION;  Surgeon: Glenna Fellows, MD;  Location: [facility] OR;  Service: General;  Laterality: N/A;  . PARTIAL HYSTERECTOMY     still has ovaries  . TONSILLECTOMY    . WISDOM TOOTH EXTRACTION       Current Meds  Medication Sig  . amphetamine-dextroamphetamine (ADDERALL XR) 30 MG 24 hr capsule 1  . aspirin EC 81 MG tablet Take 81 mg by mouth daily.  Marland Kitchen atorvastatin (LIPITOR) 80 MG tablet Take 1 tablet (80 mg total) by mouth daily.  . Biotin 1 MG CAPS Take by mouth daily.  . calcium citrate (CALCITRATE - DOSED IN MG ELEMENTAL CALCIUM) 950 MG tablet Take 630 mg of elemental calcium by mouth daily.  . cetirizine (ZYRTEC) 10 MG tablet Take 10 mg by mouth daily.  . clonazePAM (KLONOPIN) 0.5 MG tablet Take 0.5 mg by mouth 2 (two) times daily as needed for anxiety.  . Cranberry 400 MG CAPS Take 4,200 mg by mouth. Takes 3 a day  . Cyanocobalamin (GNP VITAMIN B-12) 1000 MCG TBCR 1 tablet  . Eluxadoline (VIBERZI) 75 MG TABS Take 75 mg daily by mouth.  . fluticasone (FLONASE) 50 MCG/ACT nasal spray Place 1 spray into both nostrils as needed.  . hydrochlorothiazide (HYDRODIURIL) 12.5 MG tablet TAKE 1 TABLET(12.5 MG) BY MOUTH DAILY  . metoprolol tartrate (LOPRESSOR) 25 MG tablet TAKE 1 TABLET(25 MG) BY MOUTH TWICE DAILY  . Multiple Vitamin (MULTIVITAMIN) tablet Take 1 tablet by mouth daily.  . Turmeric 500 MG CAPS Take by mouth.  . valACYclovir (VALTREX) 1000 MG tablet Take 1 tablet by mouth as needed.   . venlafaxine XR (EFFEXOR-XR) 150 MG 24 hr capsule Take 150 mg by mouth daily.  Marland Kitchen zolpidem (AMBIEN) 10 MG tablet TK 1 T PO HS PRN     Allergies:   Food color pink   Social History   Tobacco Use  . Smoking status: Former Games developer  . Smokeless tobacco: Never Used  Substance Use Topics  . Alcohol use: Yes    Comment: very rare  . Drug use: No     Family Hx: The patient's family history includes AAA (abdominal aortic aneurysm) in her father; Atrial fibrillation in her mother; CAD in her  brother; COPD in her brother, brother, father, and mother; Diabetes in her brother, maternal grandfather, maternal grandmother, and paternal grandmother; Heart disease in her father, maternal grandfather, maternal grandmother, and paternal grandfather; Heart failure in her father and mother; Kidney disease in her mother; Kidney failure in her mother; Peripheral Artery Disease in her brother; Stroke in her brother and paternal grandmother; Testicular cancer (age of onset: 76) in her son.  ROS:   Please see the history of present illness.    All other systems reviewed and are negative.   Prior CV studies:    The following studies were reviewed today:   Carotid Ultrasounds 05/14/2019: Summary: Right Carotid: Velocities in the right ICA are consistent with a 1-39% stenosis. Left Carotid: Velocities in the left ICA are consistent with a 1-39% stenosis. Vertebrals:  Bilateral vertebral arteries demonstrate antegrade flow. Subclavians: Right subclavian artery was stenotic. Normal flow hemodynamics were seen in the left subclavian artery. _______________  Echocardiogram 05/28/2019: Impressions: 1. Left ventricular ejection fraction, by calculated 3D EF is 61%. The left ventricle has  normal function. There is mildly increased left ventricular hypertrophy.  2. The left ventricle has no regional wall motion abnormalities.  3. Elevated left ventricular end-diastolic pressure.  4. Left ventricular diastolic parameters are consistent with Grade II diastolic dysfunction (pseudonormalization).  5. Global right ventricle has normal systolic function.The right ventricular size is normal. No increase in right ventricular wall thickness.  6. Left atrial size was normal.  7. Right atrial size was normal.  8. The mitral valve is normal in structure. Trivial mitral valve regurgitation. No evidence of mitral stenosis.  9. The tricuspid valve is normal in structure. Tricuspid valve regurgitation is trivial. 10.  Aortic valve mean gradient measures 8.0 mmHg. 11. The aortic valve was not well visualized. Aortic valve regurgitation is trivial. Mild to moderate aortic valve sclerosis/calcification without any evidence of aortic stenosis. 12. The pulmonic valve was normal in structure. Pulmonic valve regurgitation is trivial. 13. TR signal is inadequate for assessing pulmonary artery systolic pressure. 14. The inferior vena cava is dilated in size with >50% respiratory variability, suggesting right atrial pressure of 8 mmHg. 15. The average left ventricular global longitudinal strain is -21.0 %. _______________  Eugenie Birks Myoview 05/28/2019:  Nuclear stress EF: 60%.  The study is normal.  This is a low risk study.  There was no ST segment deviation noted during stress.  No T wave inversion was noted during stress.   Low risk stress nuclear study with normal perfusion and normal left ventricular regional and global systolic function.  Labs/Other Tests and Data Reviewed:    EKG: Most recent EKG from 05/14/2019 was personally reviewed and demonstrates normal sinus rhythm, 67 bpm, with non-specific T wave flattening in leads III and V2 but no acute changes from tracing in 2018.  Recent Labs: 05/14/2019: ALT 25; BUN 17; Creatinine, Ser 0.85; Hemoglobin 12.7; Magnesium 1.7; Platelets 289; Potassium 3.7; Sodium 141; TSH 1.040   Recent Lipid Panel Lab Results  Component Value Date/Time   CHOL 192 05/14/2019 08:31 AM   TRIG 85 05/14/2019 08:31 AM   HDL 84 05/14/2019 08:31 AM   CHOLHDL 2.3 05/14/2019 08:31 AM   LDLCALC 93 05/14/2019 08:31 AM    Wt Readings from Last 3 Encounters:  06/06/19 300 lb (136.1 kg)  05/13/19 (!) 338 lb 6.4 oz (153.5 kg)  09/20/17 287 lb 12.8 oz (130.5 kg)     Objective:    Vital Signs:  Ht  (1.676 m)   Wt 300 lb (136.1 kg)   BMI 48.42 kg/m    VS Reviewed. General: No acute distress. Pulm: No labored breathing. No coughing during visit. No audible wheezing.  Speaking in full sentences. Neuro: Alert and oriented. No slurred speech. Answers questions appropriately. Psych: Pleasant affect.  ASSESSMENT & PLAN:    Pre-Operative Evaluation Patient has upcoming repair of cystocele and rectocele planned. Echo on 05/28/2019 showed normal EF with normal wall motion and grade 2 diastolic dysfunction. Myoview on 05/28/2019 was low risk and showed no evidence of ischemia. Therefore, based on ACC/AHA guidelines, patient would be at acceptable risk for planned procedure without further cardiovascular testing. Will route note to requesting surgeon. OK to hold Aspirin for 5-7 days prior to surgery if needed with a plan to resume it as soon as felt to be feasible from a surgical standpoint in the post-operative period.  Chronic Fatigue Patient's biggest complaint today is fatigue. She has chronic fatigue listed in her history and patient states she has had problems with fatigue before but that this is  much worse. TSH, CBC, and CMET unremarkable. Patient has appointment with PCP scheduled next month. Recommended discussing more with PCP at that time.   Dyspnea Patient reports some mild improvement in shortness of breath since last visit. Echo and Myoview normal and reassuring. Suspect shortness of breath may be due to obesity and deconditioning. Not able to assess volume status over the phone but does not sound like CHF. She does not some mild ankle edema when she has been sitting at her desk all day. Recommended limiting salt intake, elevating legs when possible, and compression stocking. If lower extremity edema worsens, could consider increasing diuretic.   Palpitations Event monitor in 2017 showed PACs/PVCs. Improved on beta-blocker. Continue Lopressor 25mg  twice daily.  Hypertension BP elevated at office visit last month. Patient was at work this morning and could not measure her BP but states at home BP has been around 130/60. Will continue HCTZ 12.5mg  daily and  Lopressor 25mg  twice daily for now. BP goal <130/80. Patient to notify us if BP consistent above this.   Hyperlipidemia Recent lipid panel on 05/14/2019: Total Cholesterol 192, Triglycerides 85, HDL 84, LDL 93. LDL goal <70 given carotid disease. Lipitor was increased to 80mg  daily with plans to repeat lipids/CMET in 3 months.  Mild Bilateral Carotid Stenosis Recent carotid dopplers on 05/14/2019 showed 1-39% stenosis of bilateral internal carotid arteries. Continue aspirin and high-intensity statin.  COVID-19 Education: The signs and symptoms of COVID-19 were discussed with the patient and how to seek care for testing (follow up with PCP or arrange E-visit).  The importance of social distancing was discussed today.  Time:   Today, I have spent 10 minutes with the patient with telehealth technology discussing the above problems.     Medication Adjustments/Labs and Tests Ordered: Current medicines are reviewed at length with the patient today.  Concerns regarding medicines are outlined above.   Follow Up:  In Person in 3-6 months with Dr. Duke Salviaandolph or APP.  Signed, Corrin ParkerCallie E Shaquavia Whisonant, PA-C  06/06/2019 8:33 AM    [facility]

## 2019-06-06 ENCOUNTER — Encounter: Payer: Self-pay | Admitting: Physician Assistant

## 2019-06-06 ENCOUNTER — Telehealth (INDEPENDENT_AMBULATORY_CARE_PROVIDER_SITE_OTHER): Payer: Managed Care, Other (non HMO) | Admitting: Student

## 2019-06-06 VITALS — Ht 66.0 in | Wt 300.0 lb

## 2019-06-06 DIAGNOSIS — E785 Hyperlipidemia, unspecified: Secondary | ICD-10-CM

## 2019-06-06 DIAGNOSIS — R0600 Dyspnea, unspecified: Secondary | ICD-10-CM

## 2019-06-06 DIAGNOSIS — R002 Palpitations: Secondary | ICD-10-CM | POA: Diagnosis not present

## 2019-06-06 DIAGNOSIS — R5382 Chronic fatigue, unspecified: Secondary | ICD-10-CM

## 2019-06-06 DIAGNOSIS — I10 Essential (primary) hypertension: Secondary | ICD-10-CM

## 2019-06-06 DIAGNOSIS — Z7189 Other specified counseling: Secondary | ICD-10-CM

## 2019-06-06 DIAGNOSIS — Z01818 Encounter for other preprocedural examination: Secondary | ICD-10-CM | POA: Diagnosis not present

## 2019-06-06 DIAGNOSIS — I6523 Occlusion and stenosis of bilateral carotid arteries: Secondary | ICD-10-CM

## 2019-06-06 NOTE — Patient Instructions (Addendum)
Medication Instructions:  Your physician recommends that you continue on your current medications as directed. Please refer to the Current Medication list given to you today.  *If you need a refill on your cardiac medications before your next appointment, please call your pharmacy*  Lab Work: None ordered   If you have labs (blood work) drawn today and your tests are completely normal, you will receive your results only by: Marland Kitchen MyChart Message (if you have MyChart) OR . A paper copy in the mail If you have any lab test that is abnormal or we need to change your treatment, we will call you to review the results.  Testing/Procedures: None ordered   Follow-Up: At [facility], you and your health needs are our priority.  As part of our continuing mission to provide you with exceptional heart care, we have created designated Provider Care Teams.  These Care Teams include your primary Cardiologist (physician) and Advanced Practice Providers (APPs -  Physician Assistants and Nurse Practitioners) who all work together to provide you with the care you need, when you need it.  Your next appointment:    You are scheduled for a virtual with Dr. Oval Linsey, Someone will call you 10-15 mins prior to your appointment to go over you medications and any vitals you are able to obtain (Blood pressure, Heart rate, Weight etc). If you are scheduled for a telephone call the provider will call you from our office number, the number may show up as *unknown*. If you are scheduled for a video visit, your provider will send a text message to your phone that contains a link that you will click on, Once you click on the link, answer the questions and then you will be directed to a waiting room and your provider will enter.   Other Instructions None

## 2019-06-18 NOTE — Progress Notes (Signed)
[id number] [place], [place] - [address] AT [facility] [address] & [facility] [address] [place] [address] Phone: [phone] Fax: [phone]      Your procedure is scheduled on June 23, 2019.  Report to [facility] Main Entrance "A" at 7:30 A.M., and check in at the Admitting office.  Call this number if you have problems the morning of surgery:  [phone]  Call [phone] if you have any questions prior to your surgery date Monday-Friday 8am-4pm    Remember:  Do not eat or drink after midnight the night before your surgery   Take these medicines the morning of surgery with A SIP OF WATER: atorvastatin (LIPITOR) buPROPion (WELLBUTRIN XL) cetirizine (ZYRTEC)  Eluxadoline (VIBERZI) - if dose scheduled for day of surgery fluticasone (FLONASE) metoprolol tartrate (LOPRESSOR) Venlafaxine HCl valACYclovir (VALTREX) - if needed   As of today, STOP taking any Aspirin (unless otherwise instructed by your surgeon), Aleve, Naproxen, Ibuprofen, Motrin, Advil, Goody's, BC's, all herbal medications, fish oil, and all vitamins.    The Morning of Surgery  Do not wear jewelry, make-up or nail polish.  Do not wear lotions, powders, or perfumes or deodorant  Do not shave 48 hours prior to surgery.    Do not bring valuables to the hospital.  [facility] is not responsible for any belongings or valuables.  If you are a smoker, DO NOT Smoke 24 hours prior to surgery  If you wear a CPAP at night please bring your mask, tubing, and machine the morning of surgery   Remember that you must have someone to transport you home after your surgery, and remain with you for 24 hours if you are discharged the same day.   Please bring cases for contacts, glasses, hearing aids, dentures or bridgework because it cannot be worn into surgery.    Leave your suitcase in the car.  After surgery it may be brought to your room.  For patients admitted to the  hospital, discharge time will be determined by your treatment team.  Patients discharged the day of surgery will not be allowed to drive home.    Special instructions:   [facility]- Preparing For Surgery  Before surgery, you can play an important role. Because skin is not sterile, your skin needs to be as free of germs as possible. You can reduce the number of germs on your skin by washing with CHG (chlorahexidine gluconate) Soap before surgery.  CHG is an antiseptic cleaner which kills germs and bonds with the skin to continue killing germs even after washing.    Oral Hygiene is also important to reduce your risk of infection.  Remember - BRUSH YOUR TEETH THE MORNING OF SURGERY WITH YOUR REGULAR TOOTHPASTE  Please do not use if you have an allergy to CHG or antibacterial soaps. If your skin becomes reddened/irritated stop using the CHG.  Do not shave (including legs and underarms) for at least 48 hours prior to first CHG shower. It is OK to shave your face.  Please follow these instructions carefully.   1. Shower the NIGHT BEFORE SURGERY and the MORNING OF SURGERY with CHG Soap.   2. If you chose to wash your hair, wash your hair first as usual with your normal shampoo.  3. After you shampoo, rinse your hair and body thoroughly to remove the shampoo.  4. Use CHG as you would any other liquid soap. You can apply CHG directly to the skin and  wash gently with a scrungie or a clean washcloth.   5. Apply the CHG Soap to your body ONLY FROM THE NECK DOWN.  Do not use on open wounds or open sores. Avoid contact with your eyes, ears, mouth and genitals (private parts). Wash Face and genitals (private parts)  with your normal soap.   6. Wash thoroughly, paying special attention to the area where your surgery will be performed.  7. Thoroughly rinse your body with warm water from the neck down.  8. DO NOT shower/wash with your normal soap after using and rinsing off the CHG Soap.  9. Pat  yourself dry with a CLEAN TOWEL.  10. Wear CLEAN PAJAMAS to bed the night before surgery, wear comfortable clothes the morning of surgery  11. Place CLEAN SHEETS on your bed the night of your first shower and DO NOT SLEEP WITH PETS.    Day of Surgery:  Please shower the morning of surgery with the CHG soap Do not apply any deodorants/lotions. Please wear clean clothes to the hospital/surgery center.   Remember to brush your teeth WITH YOUR REGULAR TOOTHPASTE.   Please read over the following fact sheets that you were given.

## 2019-06-19 ENCOUNTER — Other Ambulatory Visit: Payer: Self-pay

## 2019-06-19 ENCOUNTER — Encounter (HOSPITAL_COMMUNITY): Payer: Self-pay

## 2019-06-19 ENCOUNTER — Encounter (HOSPITAL_COMMUNITY)
Admission: RE | Admit: 2019-06-19 | Discharge: 2019-06-19 | Disposition: A | Discharge status: Home / Self Care | Payer: Managed Care, Other (non HMO) | Source: Ambulatory Visit | Attending: Obstetrics and Gynecology | Admitting: Obstetrics and Gynecology

## 2019-06-19 ENCOUNTER — Other Ambulatory Visit (HOSPITAL_COMMUNITY): Admission: RE | Admit: 2019-06-19 | Payer: Managed Care, Other (non HMO) | Source: Ambulatory Visit

## 2019-06-19 DIAGNOSIS — Z01812 Encounter for preprocedural laboratory examination: Secondary | ICD-10-CM | POA: Insufficient documentation

## 2019-06-19 HISTORY — DX: Irritable bowel syndrome, unspecified: K58.9

## 2019-06-19 HISTORY — DX: Cardiac murmur, unspecified: R01.1

## 2019-06-19 HISTORY — DX: Anemia, unspecified: D64.9

## 2019-06-19 HISTORY — DX: Other complications of anesthesia, initial encounter: T88.59XA

## 2019-06-19 LAB — BASIC METABOLIC PANEL
Anion gap: 13 (ref 5–15)
BUN: 13 mg/dL (ref 6–20)
CO2: 26 mmol/L (ref 22–32)
Calcium: 8.9 mg/dL (ref 8.9–10.3)
Chloride: 103 mmol/L (ref 98–111)
Creatinine, Ser: 0.82 mg/dL (ref 0.44–1.00)
GFR calc Af Amer: >60 mL/min (ref >60)
GFR calc non Af Amer: >60 mL/min (ref >60)
Glucose, Bld: 101 mg/dL — ABNORMAL HIGH (ref 70–99)
Potassium: 3.9 mmol/L (ref 3.5–5.1)
Sodium: 142 mmol/L (ref 135–145)

## 2019-06-19 LAB — CBC
HCT: 40.8 % (ref 36.0–46.0)
Hemoglobin: 12.8 g/dL (ref 12.0–15.0)
MCH: 29.3 pg (ref 26.0–34.0)
MCHC: 31.4 g/dL (ref 30.0–36.0)
MCV: 93.4 fL (ref 80.0–100.0)
Platelets: 268 10*3/uL (ref 150–400)
RBC: 4.37 MIL/uL (ref 3.87–5.11)
RDW: 14.2 % (ref 11.5–15.5)
WBC: 8.3 10*3/uL (ref 4.0–10.5)
nRBC: 0 % (ref 0.0–0.2)

## 2019-06-19 MED ORDER — DEXTROSE 5 % IV SOLN
3.0000 g | INTRAVENOUS | Status: AC
  Administered 2019-06-23: 3 g via INTRAVENOUS
  Filled 2019-06-19: qty 3
  Filled 2019-06-19: qty 3000

## 2019-06-19 NOTE — Progress Notes (Signed)
PCP - Dr. Kathyrn Lass Cardiologist - Dr. Oval Linsey - Cardiac Clearance in [place] on 06/16/19  Chest x-ray - N/A EKG - 05/14/19 Stress Test - 05/26/19 ECHO -  05/28/19 Cardiac Cath - denies  Sleep Study -  CPAP - yes  Blood Thinner Instructions: N/A Aspirin Instructions: Instructed pt to call surgeon and ask about stopping Aspirin  COVID TEST- 06/19/19   Anesthesia review: No  Patient denies shortness of breath, fever, cough and chest pain at PAT appointment   All instructions explained to the patient, with a verbal understanding of the material. Patient agrees to go over the instructions while at home for a better understanding. Patient also instructed to self quarantine after being tested for COVID-19. The opportunity to ask questions was provided.  Reviewed Visitor policy with pt and she voiced understanding.

## 2019-06-19 NOTE — Pre-Procedure Instructions (Signed)
Kimberly Castro  06/19/2019    Your procedure is scheduled on Monday, June 22, 2018 at 9:30 AM.   Report to [facility] Entrance "A" Admitting Office at 7:30 AM.   Call this number if you have problems the morning of surgery: [phone]   Remember:  Do not eat or drink after midnight Sunday, 06/21/18.  Take these medicines the morning of surgery with A SIP OF WATER: Atorvastatin (Lipitor), Bupropion (Wellbutrin), Cetirizine (Zyrtec), Metoprolol (Lopressor), Venlafaxine, Flonase nasal spray  Stop Aspirin as instructed by surgeon. Stop Herbal medications and Multiple Vitamins as of today prior to surgery. Do not use NSAIDS (Ibuprofen, Aleve, etc), Aspirin products (BC Powders, Goodys, etc) or Fish Oil prior to surgery.    Do not wear jewelry, make-up or nail polish.  Do not wear lotions, powders, perfumes or deodorant.  Do not shave 48 hours prior to surgery.   Do not bring valuables to the hospital.  [facility] is not responsible for any belongings or valuables.  Contacts, dentures or bridgework may not be worn into surgery.  Leave your suitcase in the car.  After surgery it may be brought to your room.  For patients admitted to the hospital, discharge time will be determined by your treatment team.  Patients discharged the day of surgery will not be allowed to drive home.   [facility] - Preparing for Surgery  Before surgery, you can play an important role.  Because skin is not sterile, your skin needs to be as free of germs as possible.  You can reduce the number of germs on you skin by washing with CHG (chlorahexidine gluconate) soap before surgery.  CHG is an antiseptic cleaner which kills germs and bonds with the skin to continue killing germs even after washing.  Oral Hygiene is also important in reducing the risk of infection.  Remember to brush your teeth with your regular toothpaste the morning of surgery.  Please DO NOT use if you have an allergy to CHG or  antibacterial soaps.  If your skin becomes reddened/irritated stop using the CHG and inform your nurse when you arrive at Short Stay.  Do not shave (including legs and underarms) for at least 48 hours prior to the first CHG shower.  You may shave your face.  Please follow these instructions carefully:   1.  Shower with CHG Soap the night before surgery and the morning of Surgery.  2.  If you choose to wash your hair, wash your hair first as usual with your normal shampoo.  3.  After you shampoo, rinse your hair and body thoroughly to remove the shampoo. 4.  Use CHG as you would any other liquid soap.  You can apply chg directly to the skin and wash gently with a      scrungie or washcloth.           5.  Apply the CHG Soap to your body ONLY FROM THE NECK DOWN.   Do not use on open wounds or open sores. Avoid contact with your eyes, ears, mouth and genitals (private parts).  Wash genitals (private parts) with your normal soap.  6.  Wash thoroughly, paying special attention to the area where your surgery will be performed.  7.  Thoroughly rinse your body with warm water from the neck down.  8.  DO NOT shower/wash with your normal soap after using and rinsing off the CHG Soap.  9.  Pat yourself dry with a clean towel.  10.  Wear clean pajamas.            11.  Place clean sheets on your bed the night of your first shower and do not sleep with pets.  Day of Surgery  Shower as above. Do not apply any lotions/deodorants the morning of surgery.   Please wear clean clothes to the hospital. Remember to brush your teeth with toothpaste.  Please read over the fact sheets that you were given.

## 2019-06-21 ENCOUNTER — Other Ambulatory Visit (HOSPITAL_COMMUNITY)
Admission: RE | Admit: 2019-06-21 | Discharge: 2019-06-21 | Disposition: A | Discharge status: Home / Self Care | Payer: BC Managed Care – PPO | Source: Ambulatory Visit | Attending: Obstetrics and Gynecology | Admitting: Obstetrics and Gynecology

## 2019-06-21 DIAGNOSIS — Z01812 Encounter for preprocedural laboratory examination: Secondary | ICD-10-CM | POA: Diagnosis not present

## 2019-06-21 DIAGNOSIS — Z20822 Contact with and (suspected) exposure to covid-19: Secondary | ICD-10-CM | POA: Diagnosis not present

## 2019-06-21 LAB — SARS CORONAVIRUS 2 (TAT 6-24 HRS): SARS Coronavirus 2: NEGATIVE

## 2019-06-23 ENCOUNTER — Observation Stay (HOSPITAL_COMMUNITY)
Admission: RE | Admit: 2019-06-23 | Discharge: 2019-06-24 | Disposition: A | Discharge status: Home / Self Care | Payer: BC Managed Care – PPO | Attending: Obstetrics and Gynecology | Admitting: Obstetrics and Gynecology

## 2019-06-23 ENCOUNTER — Ambulatory Visit (HOSPITAL_COMMUNITY): Payer: BC Managed Care – PPO | Admitting: Physician Assistant

## 2019-06-23 ENCOUNTER — Encounter (HOSPITAL_COMMUNITY)
Admission: RE | Disposition: A | Discharge status: Home / Self Care | Payer: Self-pay | Source: Home / Self Care | Attending: Obstetrics and Gynecology

## 2019-06-23 ENCOUNTER — Encounter (HOSPITAL_COMMUNITY): Payer: Self-pay | Admitting: Obstetrics and Gynecology

## 2019-06-23 ENCOUNTER — Other Ambulatory Visit: Payer: Self-pay

## 2019-06-23 DIAGNOSIS — I10 Essential (primary) hypertension: Secondary | ICD-10-CM | POA: Insufficient documentation

## 2019-06-23 DIAGNOSIS — R32 Unspecified urinary incontinence: Secondary | ICD-10-CM | POA: Diagnosis present

## 2019-06-23 DIAGNOSIS — E7800 Pure hypercholesterolemia, unspecified: Secondary | ICD-10-CM | POA: Diagnosis not present

## 2019-06-23 DIAGNOSIS — F419 Anxiety disorder, unspecified: Secondary | ICD-10-CM | POA: Insufficient documentation

## 2019-06-23 DIAGNOSIS — M797 Fibromyalgia: Secondary | ICD-10-CM | POA: Diagnosis not present

## 2019-06-23 DIAGNOSIS — R339 Retention of urine, unspecified: Secondary | ICD-10-CM | POA: Diagnosis present

## 2019-06-23 DIAGNOSIS — N3946 Mixed incontinence: Secondary | ICD-10-CM | POA: Diagnosis not present

## 2019-06-23 DIAGNOSIS — N8110 Cystocele, unspecified: Secondary | ICD-10-CM | POA: Diagnosis not present

## 2019-06-23 DIAGNOSIS — F329 Major depressive disorder, single episode, unspecified: Secondary | ICD-10-CM | POA: Diagnosis not present

## 2019-06-23 DIAGNOSIS — N819 Female genital prolapse, unspecified: Secondary | ICD-10-CM | POA: Insufficient documentation

## 2019-06-23 DIAGNOSIS — Z6841 Body Mass Index (BMI) 40.0 and over, adult: Secondary | ICD-10-CM | POA: Diagnosis not present

## 2019-06-23 DIAGNOSIS — N816 Rectocele: Principal | ICD-10-CM | POA: Insufficient documentation

## 2019-06-23 DIAGNOSIS — G4730 Sleep apnea, unspecified: Secondary | ICD-10-CM | POA: Diagnosis not present

## 2019-06-23 DIAGNOSIS — Z79899 Other long term (current) drug therapy: Secondary | ICD-10-CM | POA: Diagnosis not present

## 2019-06-23 DIAGNOSIS — Z87891 Personal history of nicotine dependence: Secondary | ICD-10-CM | POA: Insufficient documentation

## 2019-06-23 HISTORY — PX: BLADDER SUSPENSION: SHX72

## 2019-06-23 HISTORY — PX: ANTERIOR AND POSTERIOR REPAIR: SHX5121

## 2019-06-23 SURGERY — ANTERIOR (CYSTOCELE) AND POSTERIOR REPAIR (RECTOCELE)
Anesthesia: General | Site: Vagina

## 2019-06-23 MED ORDER — MIDAZOLAM HCL 2 MG/2ML IJ SOLN
INTRAMUSCULAR | Status: AC
  Filled 2019-06-23: qty 2

## 2019-06-23 MED ORDER — PROPOFOL 10 MG/ML IV BOLUS
INTRAVENOUS | Status: DC | PRN
  Administered 2019-06-23: 200 mg via INTRAVENOUS

## 2019-06-23 MED ORDER — BUPROPION HCL ER (XL) 150 MG PO TB24
150.0000 mg | ORAL_TABLET | Freq: Every day | ORAL | Status: DC
  Filled 2019-06-23: qty 1

## 2019-06-23 MED ORDER — VENLAFAXINE HCL ER 75 MG PO CP24: 225.0000 mg | ORAL_CAPSULE | Freq: Every day | ORAL | Status: DC

## 2019-06-23 MED ORDER — CIPROFLOXACIN HCL 250 MG PO TABS
250.0000 mg | ORAL_TABLET | Freq: Two times a day (BID) | ORAL | Status: DC
  Administered 2019-06-23 – 2019-06-24 (×2): 250 mg via ORAL
  Filled 2019-06-23 (×3): qty 1

## 2019-06-23 MED ORDER — ONDANSETRON HCL 4 MG/2ML IJ SOLN
INTRAMUSCULAR | Status: DC | PRN
  Administered 2019-06-23: 4 mg via INTRAVENOUS

## 2019-06-23 MED ORDER — HYDROMORPHONE HCL 1 MG/ML IJ SOLN
1.0000 mg | INTRAMUSCULAR | Status: DC | PRN
  Administered 2019-06-23: 1 mg via INTRAVENOUS
  Filled 2019-06-23: qty 1

## 2019-06-23 MED ORDER — OXYCODONE HCL 5 MG PO TABS
5.0000 mg | ORAL_TABLET | Freq: Once | ORAL | Status: AC | PRN
  Administered 2019-06-23: 13:00:00 5 mg via ORAL

## 2019-06-23 MED ORDER — LACTATED RINGERS IV SOLN: INTRAVENOUS | Status: DC

## 2019-06-23 MED ORDER — ROCURONIUM BROMIDE 50 MG/5ML IV SOSY
PREFILLED_SYRINGE | INTRAVENOUS | Status: DC | PRN
  Administered 2019-06-23: 10 mg via INTRAVENOUS
  Administered 2019-06-23: 20 mg via INTRAVENOUS
  Administered 2019-06-23: 50 mg via INTRAVENOUS

## 2019-06-23 MED ORDER — OXYCODONE-ACETAMINOPHEN 5-325 MG PO TABS
1.0000 | ORAL_TABLET | Freq: Four times a day (QID) | ORAL | Status: DC | PRN
  Administered 2019-06-23: 2 via ORAL
  Filled 2019-06-23: qty 2

## 2019-06-23 MED ORDER — MIDAZOLAM HCL 5 MG/5ML IJ SOLN
INTRAMUSCULAR | Status: DC | PRN
  Administered 2019-06-23: 2 mg via INTRAVENOUS

## 2019-06-23 MED ORDER — KETOROLAC TROMETHAMINE 30 MG/ML IJ SOLN
30.0000 mg | Freq: Once | INTRAMUSCULAR | Status: AC
  Administered 2019-06-23: 30 mg via INTRAVENOUS

## 2019-06-23 MED ORDER — KETOROLAC TROMETHAMINE 30 MG/ML IJ SOLN
INTRAMUSCULAR | Status: AC
  Filled 2019-06-23: qty 1

## 2019-06-23 MED ORDER — OXYCODONE HCL 5 MG PO TABS
ORAL_TABLET | ORAL | Status: AC
  Filled 2019-06-23: qty 1

## 2019-06-23 MED ORDER — HYDROCHLOROTHIAZIDE 25 MG PO TABS: 12.5000 mg | ORAL_TABLET | Freq: Every day | ORAL | Status: DC

## 2019-06-23 MED ORDER — PROPOFOL 10 MG/ML IV BOLUS
INTRAVENOUS | Status: AC
  Filled 2019-06-23: qty 20

## 2019-06-23 MED ORDER — KETOROLAC TROMETHAMINE 30 MG/ML IJ SOLN
30.0000 mg | Freq: Four times a day (QID) | INTRAMUSCULAR | Status: DC
  Administered 2019-06-23 – 2019-06-24 (×4): 30 mg via INTRAVENOUS
  Filled 2019-06-23 (×4): qty 1

## 2019-06-23 MED ORDER — FENTANYL CITRATE (PF) 100 MCG/2ML IJ SOLN
INTRAMUSCULAR | Status: AC
  Filled 2019-06-23: qty 2

## 2019-06-23 MED ORDER — ONDANSETRON HCL 4 MG/2ML IJ SOLN: 4.0000 mg | Freq: Four times a day (QID) | INTRAMUSCULAR | Status: DC | PRN

## 2019-06-23 MED ORDER — FENTANYL CITRATE (PF) 100 MCG/2ML IJ SOLN
INTRAMUSCULAR | Status: DC | PRN
  Administered 2019-06-23: 50 ug via INTRAVENOUS
  Administered 2019-06-23: 100 ug via INTRAVENOUS
  Administered 2019-06-23 (×2): 50 ug via INTRAVENOUS
  Administered 2019-06-23: 100 ug via INTRAVENOUS

## 2019-06-23 MED ORDER — SODIUM CHLORIDE 0.9 % IR SOLN
Status: DC | PRN
  Administered 2019-06-23: 3000 mL

## 2019-06-23 MED ORDER — SIMETHICONE 80 MG PO CHEW
80.0000 mg | CHEWABLE_TABLET | Freq: Four times a day (QID) | ORAL | Status: DC | PRN
  Filled 2019-06-23: qty 1

## 2019-06-23 MED ORDER — ESTRADIOL 0.1 MG/GM VA CREA
TOPICAL_CREAM | VAGINAL | Status: AC
  Filled 2019-06-23: qty 42.5

## 2019-06-23 MED ORDER — FENTANYL CITRATE (PF) 250 MCG/5ML IJ SOLN
INTRAMUSCULAR | Status: AC
  Filled 2019-06-23: qty 5

## 2019-06-23 MED ORDER — VASOPRESSIN 20 UNIT/ML IV SOLN
INTRAVENOUS | Status: AC
  Filled 2019-06-23: qty 1

## 2019-06-23 MED ORDER — PANTOPRAZOLE SODIUM 40 MG PO TBEC
40.0000 mg | DELAYED_RELEASE_TABLET | Freq: Every day | ORAL | Status: DC
  Administered 2019-06-23: 40 mg via ORAL
  Filled 2019-06-23: qty 1

## 2019-06-23 MED ORDER — LIDOCAINE 2% (20 MG/ML) 5 ML SYRINGE
INTRAMUSCULAR | Status: DC | PRN
  Administered 2019-06-23: 60 mg via INTRAVENOUS

## 2019-06-23 MED ORDER — ONDANSETRON HCL 4 MG PO TABS: 4.0000 mg | ORAL_TABLET | Freq: Four times a day (QID) | ORAL | Status: DC | PRN

## 2019-06-23 MED ORDER — FENTANYL CITRATE (PF) 100 MCG/2ML IJ SOLN
25.0000 ug | INTRAMUSCULAR | Status: DC | PRN
  Administered 2019-06-23 (×2): 50 ug via INTRAVENOUS
  Administered 2019-06-23: 0.25 ug via INTRAVENOUS
  Administered 2019-06-23: 13:00:00 25 ug via INTRAVENOUS

## 2019-06-23 MED ORDER — MENTHOL 3 MG MT LOZG: 1.0000 | LOZENGE | OROMUCOSAL | Status: DC | PRN

## 2019-06-23 MED ORDER — VASOPRESSIN 20 UNIT/ML IV SOLN
INTRAVENOUS | Status: DC | PRN
  Administered 2019-06-23: 51 mL via INTRAMUSCULAR

## 2019-06-23 MED ORDER — ESTRADIOL 0.1 MG/GM VA CREA
TOPICAL_CREAM | VAGINAL | Status: DC | PRN
  Administered 2019-06-23: 1 via VAGINAL

## 2019-06-23 MED ORDER — DOCUSATE SODIUM 100 MG PO CAPS
100.0000 mg | ORAL_CAPSULE | Freq: Every day | ORAL | Status: DC
  Administered 2019-06-23 – 2019-06-24 (×2): 100 mg via ORAL
  Filled 2019-06-23: qty 1

## 2019-06-23 MED ORDER — DEXAMETHASONE SODIUM PHOSPHATE 10 MG/ML IJ SOLN
INTRAMUSCULAR | Status: DC | PRN
  Administered 2019-06-23: 5 mg via INTRAVENOUS

## 2019-06-23 MED ORDER — SUGAMMADEX SODIUM 200 MG/2ML IV SOLN
INTRAVENOUS | Status: DC | PRN
  Administered 2019-06-23: 200 mg via INTRAVENOUS

## 2019-06-23 MED ORDER — SUCCINYLCHOLINE CHLORIDE 200 MG/10ML IV SOSY
PREFILLED_SYRINGE | INTRAVENOUS | Status: AC
  Filled 2019-06-23: qty 10

## 2019-06-23 MED ORDER — METOPROLOL TARTRATE 25 MG PO TABS
25.0000 mg | ORAL_TABLET | Freq: Two times a day (BID) | ORAL | Status: DC
  Administered 2019-06-23: 25 mg via ORAL
  Filled 2019-06-23: qty 1

## 2019-06-23 MED ORDER — ROCURONIUM BROMIDE 10 MG/ML (PF) SYRINGE
PREFILLED_SYRINGE | INTRAVENOUS | Status: AC
  Filled 2019-06-23: qty 10

## 2019-06-23 MED ORDER — OXYCODONE-ACETAMINOPHEN 5-325 MG PO TABS
1.0000 | ORAL_TABLET | ORAL | Status: DC | PRN
  Administered 2019-06-23 – 2019-06-24 (×5): 2 via ORAL
  Filled 2019-06-23 (×5): qty 2

## 2019-06-23 MED ORDER — SODIUM CHLORIDE (PF) 0.9 % IJ SOLN
INTRAMUSCULAR | Status: AC
  Filled 2019-06-23: qty 50

## 2019-06-23 MED ORDER — 0.9 % SODIUM CHLORIDE (POUR BTL) OPTIME
TOPICAL | Status: DC | PRN
  Administered 2019-06-23: 1000 mL

## 2019-06-23 MED ORDER — OXYCODONE HCL 5 MG/5ML PO SOLN: 5.0000 mg | Freq: Once | ORAL | Status: AC | PRN

## 2019-06-23 MED ORDER — LIDOCAINE 2% (20 MG/ML) 5 ML SYRINGE
INTRAMUSCULAR | Status: AC
  Filled 2019-06-23: qty 5

## 2019-06-23 SURGICAL SUPPLY — 44 items
BAG URINE DRAIN 2000ML AR STRL (UROLOGICAL SUPPLIES) ×3 IMPLANT
BAG URINE DRAINAGE (UROLOGICAL SUPPLIES) ×3 IMPLANT
BLADE SURG 11 STRL SS (BLADE) ×3 IMPLANT
BLADE SURG 15 STRL LF DISP TIS (BLADE) ×2 IMPLANT
BLADE SURG 15 STRL SS (BLADE) ×1
CANISTER SUCT 3000ML PPV (MISCELLANEOUS) ×3 IMPLANT
CATH FOLEY 2WAY SLVR  5CC 18FR (CATHETERS) ×1
CATH FOLEY 2WAY SLVR 5CC 18FR (CATHETERS) ×2 IMPLANT
CATH ROBINSON RED A/P 16FR (CATHETERS) ×3 IMPLANT
COVER WAND RF STERILE (DRAPES) ×3 IMPLANT
DECANTER SPIKE VIAL GLASS SM (MISCELLANEOUS) IMPLANT
DERMABOND ADVANCED (GAUZE/BANDAGES/DRESSINGS) ×1
DERMABOND ADVANCED .7 DNX12 (GAUZE/BANDAGES/DRESSINGS) ×2 IMPLANT
DRAPE SHEET LG 3/4 BI-LAMINATE (DRAPES) ×6 IMPLANT
GAUZE 4X4 16PLY RFD (DISPOSABLE) ×3 IMPLANT
GAUZE PACKING 1 X5 YD ST (GAUZE/BANDAGES/DRESSINGS) ×3 IMPLANT
GAUZE PACKING 2X5 YD STRL (GAUZE/BANDAGES/DRESSINGS) IMPLANT
GLOVE BIO SURGEON STRL SZ7.5 (GLOVE) ×6 IMPLANT
GLOVE BIOGEL PI IND STRL 7.0 (GLOVE) ×4 IMPLANT
GLOVE BIOGEL PI IND STRL 7.5 (GLOVE) ×2 IMPLANT
GLOVE BIOGEL PI INDICATOR 7.0 (GLOVE) ×2
GLOVE BIOGEL PI INDICATOR 7.5 (GLOVE) ×1
GOWN STRL REUS W/ TWL LRG LVL3 (GOWN DISPOSABLE) ×8 IMPLANT
GOWN STRL REUS W/TWL LRG LVL3 (GOWN DISPOSABLE) ×4
KIT TURNOVER KIT B (KITS) ×3 IMPLANT
NEEDLE HYPO 22GX1.5 SAFETY (NEEDLE) ×3 IMPLANT
NS IRRIG 1000ML POUR BTL (IV SOLUTION) ×3 IMPLANT
PACK VAGINAL WOMENS (CUSTOM PROCEDURE TRAY) ×3 IMPLANT
SET CYSTO W/LG BORE CLAMP LF (SET/KITS/TRAYS/PACK) ×3 IMPLANT
SLING TRANS VAGINAL TAPE (Sling) ×1 IMPLANT
SLING UTERINE/ABD GYNECARE TVT (Sling) ×2 IMPLANT
SPECIMEN JAR MEDIUM (MISCELLANEOUS) IMPLANT
SUT VIC AB 0 CT1 18XCR BRD8 (SUTURE) IMPLANT
SUT VIC AB 0 CT1 8-18 (SUTURE)
SUT VIC AB 1 CT1 36 (SUTURE) IMPLANT
SUT VIC AB 2-0 CT1 27 (SUTURE) ×6
SUT VIC AB 2-0 CT1 TAPERPNT 27 (SUTURE) ×12 IMPLANT
SUT VIC AB 2-0 SH 27 (SUTURE) ×10
SUT VIC AB 2-0 SH 27XBRD (SUTURE) ×20 IMPLANT
SUT VIC AB 3-0 SH 27 (SUTURE)
SUT VIC AB 3-0 SH 27X BRD (SUTURE) IMPLANT
TOWEL GREEN STERILE FF (TOWEL DISPOSABLE) ×6 IMPLANT
TRAY FOLEY W/BAG SLVR 16FR (SET/KITS/TRAYS/PACK) ×1
TRAY FOLEY W/BAG SLVR 16FR ST (SET/KITS/TRAYS/PACK) ×2 IMPLANT

## 2019-06-23 NOTE — Transfer of Care (Signed)
Immediate Anesthesia Transfer of Care Note  Patient: CALEDONIA ZOU  Procedure(s) Performed: ANTERIOR (CYSTOCELE) AND POSTERIOR REPAIR (RECTOCELE) (N/A Vagina ) TRANSVAGINAL TAPE (TVT) PROCEDURE (N/A Perineum)  Patient Location: PACU  Anesthesia Type:General  Level of Consciousness: awake, alert  and oriented  Airway & Oxygen Therapy: Patient Spontanous Breathing and Patient connected to nasal cannula oxygen  Post-op Assessment: Report given to RN and Post -op Vital signs reviewed and stable  Post vital signs: Reviewed and stable  Last Vitals:  Vitals Value Taken Time  BP 120/76 06/23/19 1244  Temp    Pulse 71 06/23/19 1246  Resp 21 06/23/19 1246  SpO2 93 % 06/23/19 1246  Vitals shown include unvalidated device data.  Last Pain:  Vitals:   06/23/19 0822  TempSrc:   PainSc: 1       Patients Stated Pain Goal: 3 (06/23/19 4163)  Complications: No apparent anesthesia complications

## 2019-06-23 NOTE — Op Note (Signed)
Preop Diagnosis: 1.Symptomatic Cystocele and Rectocele 2.Urinary Incontinence  Postop Diagnosis: 1.Symptomatic Cystocele and Rectocele 2.Urinary Incontinence  Procedure: 1.ANTERIOR AND POSTERIOR REPAIR 2.TVT 3.CYSTOSCOPY  Anesthesia: General   Attending: Osborn Coho, MD   Assistant: Henreitta Leber, PA-C  Findings: Cystocele and rectocele  Pathology: N/a  Fluids: 1700 cc  UOP: 500 cc  EBL: 100 cc  Complications: None  Procedure: The patient was taken to the operating room after the risks, benefits and alternatives were discussed with the patient, the patient verbalized understanding and consent signed and witnessed. A timeout was performed per protocol. The patient was prepped and draped in the normal sterile fashion in the dorsal lithotomy position and the patient was placed under general anesthesia per the anesthesiologist. A weighted speculum was placed in the patient's vagina and the anterior vaginal wall was retracted using Deaver retractors. The anterior vaginal wall was injected with dilute pitressin (20u in 50cc NS) and the anterior vaginal wall was then incised and dissected away from the underlying layer of tissue. The cystocele was repaired with a purse string stitch and plication stitches using 2-0 vicryl. The excess vaginal wall tissue was excised and repaired with interrupted stitches of 2-0 Vicryl.  A weighted speculum was placed in the patient's vagina and the anterior vaginal wall was injected with dilute pitressin at a concentration of 20 units of pitressin in a total of 50cc of normal saline.  An incision was made in the anterior wall of the vagina for approximately 1cm beneath the midurethra and the underlying tissue was dissected away from the anterior vaginal wall down to the level of the lower symphysis pubis bilaterally. Attention was then turned to the mons pubis where two 5 mm incisions were made 2 fingerbreadths from the midline. The  transabdominal guide was then passed through the mons pubis incision on the patient's right down through the space of Retzius and out through the anterior vaginal wall after deflecting the rigid urethral catheter guide to the ipsilateral side. The same was done on the contralateral side. Cystoscopy was performed and no invadvertant bladder injury was noted. The bladder was drained with a Foley while deflecting the rigid urethral catheter guide to the patient's right and the mesh was attached to the transabdominal guide and elevated up through the space of Retzius and out through the incision on the mons pubis on the ipsilateral side. The same was done on the contralateral side. Cystoscopy was performed again and no inadvertant bladder injury was noted. The 71 French Foley was left in the urethra and a large Tresa Endo was placed between the urethra and the mesh in order to leave the mesh slack beneath the midurethra. The mesh was then cut flush with the skin at the mons pubis incisions bilaterally.  Cystoscopy was performed again and bilateral ureters were noted to efflux without difficulty.  The anterior vaginal wall incision was repaired with 2-0 vicryl with interrupted stitches.  Attention was then turned to the posterior vaginal wall where dilute Pitressin was injected. The posterior vaginal wall was incised and the underlying tissue was dissected away from the posterior vaginal wall.  There was a significant amount of dense scar tissue that extended from the introitus up the midline of the posterior vagina. The rectocele was repaired using plication stitches of 2-0 Vicryl. Excess posterior vaginal wall tissue was excised and the posterior vaginal wall repaired with 2-0 vicryl via a running interlocking stitch. The perineum was repaired with 2-0 Vicryl via a subcuticular stitch. The bilateral incisions  on the mons pubis were then cleaned and Dermabond applied. The vagina was packed with estrogen-soaked packing.  The patient tolerated the procedure well and was awaiting return to the recovery room in good condition.

## 2019-06-23 NOTE — Anesthesia Procedure Notes (Signed)
Procedure Name: Intubation Date/Time: 06/23/2019 9:36 AM Performed by: Babs Bertin, CRNA Pre-anesthesia Checklist: Patient identified, Emergency Drugs available, Suction available and Patient being monitored Patient Re-evaluated:Patient Re-evaluated prior to induction Oxygen Delivery Method: Circle System Utilized Preoxygenation: Pre-oxygenation with 100% oxygen Induction Type: IV induction Ventilation: Mask ventilation without difficulty Laryngoscope Size: Mac and 4 Grade View: Grade I Tube type: Oral Tube size: 7.0 mm Number of attempts: 1 Airway Equipment and Method: Stylet and Oral airway Placement Confirmation: ETT inserted through vocal cords under direct vision,  positive ETCO2 and breath sounds checked- equal and bilateral Secured at: 21 cm Tube secured with: Tape Dental Injury: Teeth and Oropharynx as per pre-operative assessment

## 2019-06-23 NOTE — Progress Notes (Addendum)
C/o pain VSS AF Lungs cta CV RRR Abd soft, NABS, ND, minimal tenderness Ext SCDs are on, no calf tenderness Cont routine post op care Change dilaudid to q2hrs prn and percocet to q4hrs prn Labs in am Encourage IS Packing out at 6am with foley Adequate UOP (about 200cc over 3.5hrs

## 2019-06-23 NOTE — Anesthesia Preprocedure Evaluation (Signed)
Anesthesia Evaluation  Patient identified by MRN, date of birth, ID band Patient awake    Reviewed: Allergy & Precautions, H&P , NPO status , Patient's Chart, lab work & pertinent test results  Airway Mallampati: II   Neck ROM: full    Dental   Pulmonary sleep apnea , former smoker,    breath sounds clear to auscultation       Cardiovascular hypertension,  Rhythm:regular Rate:Normal     Neuro/Psych PSYCHIATRIC DISORDERS Anxiety Depression  Neuromuscular disease    GI/Hepatic   Endo/Other  Morbid obesity  Renal/GU      Musculoskeletal  (+) Fibromyalgia -  Abdominal   Peds  Hematology   Anesthesia Other Findings   Reproductive/Obstetrics                             Anesthesia Physical Anesthesia Plan  ASA: II  Anesthesia Plan: General   Post-op Pain Management:    Induction: Intravenous  PONV Risk Score and Plan: 3 and Ondansetron, Dexamethasone, Midazolam and Treatment may vary due to age or medical condition  Airway Management Planned: Oral ETT  Additional Equipment:   Intra-op Plan:   Post-operative Plan: Extubation in OR  Informed Consent: I have reviewed the patients History and Physical, chart, labs and discussed the procedure including the risks, benefits and alternatives for the proposed anesthesia with the patient or authorized representative who has indicated his/her understanding and acceptance.       Plan Discussed with: CRNA, Anesthesiologist and Surgeon  Anesthesia Plan Comments:         Anesthesia Quick Evaluation

## 2019-06-24 ENCOUNTER — Encounter: Payer: Self-pay | Admitting: *Deleted

## 2019-06-24 DIAGNOSIS — F419 Anxiety disorder, unspecified: Secondary | ICD-10-CM | POA: Diagnosis not present

## 2019-06-24 DIAGNOSIS — N816 Rectocele: Secondary | ICD-10-CM | POA: Diagnosis not present

## 2019-06-24 DIAGNOSIS — N8110 Cystocele, unspecified: Secondary | ICD-10-CM | POA: Diagnosis not present

## 2019-06-24 DIAGNOSIS — I10 Essential (primary) hypertension: Secondary | ICD-10-CM | POA: Diagnosis not present

## 2019-06-24 DIAGNOSIS — Z79899 Other long term (current) drug therapy: Secondary | ICD-10-CM | POA: Diagnosis not present

## 2019-06-24 DIAGNOSIS — F329 Major depressive disorder, single episode, unspecified: Secondary | ICD-10-CM | POA: Diagnosis not present

## 2019-06-24 DIAGNOSIS — Z6841 Body Mass Index (BMI) 40.0 and over, adult: Secondary | ICD-10-CM | POA: Diagnosis not present

## 2019-06-24 DIAGNOSIS — E7800 Pure hypercholesterolemia, unspecified: Secondary | ICD-10-CM | POA: Diagnosis not present

## 2019-06-24 DIAGNOSIS — M797 Fibromyalgia: Secondary | ICD-10-CM | POA: Diagnosis not present

## 2019-06-24 DIAGNOSIS — G4730 Sleep apnea, unspecified: Secondary | ICD-10-CM | POA: Diagnosis not present

## 2019-06-24 DIAGNOSIS — N819 Female genital prolapse, unspecified: Secondary | ICD-10-CM | POA: Diagnosis not present

## 2019-06-24 DIAGNOSIS — N3946 Mixed incontinence: Secondary | ICD-10-CM | POA: Diagnosis not present

## 2019-06-24 DIAGNOSIS — Z87891 Personal history of nicotine dependence: Secondary | ICD-10-CM | POA: Diagnosis not present

## 2019-06-24 DIAGNOSIS — R7989 Other specified abnormal findings of blood chemistry: Secondary | ICD-10-CM | POA: Diagnosis not present

## 2019-06-24 LAB — CBC
HCT: 35.5 % — ABNORMAL LOW (ref 36.0–46.0)
Hemoglobin: 11.3 g/dL — ABNORMAL LOW (ref 12.0–15.0)
MCH: 29.6 pg (ref 26.0–34.0)
MCHC: 31.8 g/dL (ref 30.0–36.0)
MCV: 92.9 fL (ref 80.0–100.0)
Platelets: 277 10*3/uL (ref 150–400)
RBC: 3.82 MIL/uL — ABNORMAL LOW (ref 3.87–5.11)
RDW: 14.5 % (ref 11.5–15.5)
WBC: 15.1 10*3/uL — ABNORMAL HIGH (ref 4.0–10.5)
nRBC: 0 % (ref 0.0–0.2)

## 2019-06-24 LAB — BASIC METABOLIC PANEL
Anion gap: 10 (ref 5–15)
Anion gap: 11 (ref 5–15)
BUN: 18 mg/dL (ref 6–20)
BUN: 19 mg/dL (ref 6–20)
CO2: 28 mmol/L (ref 22–32)
CO2: 24 mmol/L (ref 22–32)
Calcium: 8.4 mg/dL — ABNORMAL LOW (ref 8.9–10.3)
Calcium: 8.1 mg/dL — ABNORMAL LOW (ref 8.9–10.3)
Chloride: 103 mmol/L (ref 98–111)
Chloride: 104 mmol/L (ref 98–111)
Creatinine, Ser: 1.09 mg/dL — ABNORMAL HIGH (ref 0.44–1.00)
Creatinine, Ser: 1.37 mg/dL — ABNORMAL HIGH (ref 0.44–1.00)
GFR calc Af Amer: >60 mL/min (ref >60)
GFR calc Af Amer: 49 mL/min — ABNORMAL LOW (ref >60)
GFR calc non Af Amer: 56 mL/min — ABNORMAL LOW (ref >60)
GFR calc non Af Amer: 43 mL/min — ABNORMAL LOW (ref >60)
Glucose, Bld: 89 mg/dL (ref 70–99)
Glucose, Bld: 152 mg/dL — ABNORMAL HIGH (ref 70–99)
Potassium: 3.9 mmol/L (ref 3.5–5.1)
Potassium: 3.5 mmol/L (ref 3.5–5.1)
Sodium: 141 mmol/L (ref 135–145)
Sodium: 139 mmol/L (ref 135–145)

## 2019-06-24 MED ORDER — SULFAMETHOXAZOLE-TRIMETHOPRIM 800-160 MG PO TABS: 1.0000 | ORAL_TABLET | Freq: Two times a day (BID) | ORAL | 0 refills | Status: AC

## 2019-06-24 MED ORDER — OXYCODONE-ACETAMINOPHEN 5-325 MG PO TABS: ORAL_TABLET | ORAL | 0 refills | Status: DC

## 2019-06-24 MED ORDER — IBUPROFEN 600 MG PO TABS: ORAL_TABLET | ORAL | 1 refills | Status: DC

## 2019-06-24 NOTE — Discharge Summary (Addendum)
Physician Discharge Summary  Patient ID: MINDA FAAS MRN: [id number] DOB/AGE: [id number] 58 y.o.  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  Post operative course was unremarkable with the patient resuming bowel and bladder function by post operative day #1 and was therefore deemed ready for discharge home.  Discharge hemoglobin was: 11.3  Disposition: Discharge disposition: 01-Home or Self Care     Home to Self Care  Discharge Medications:  Allergies as of 06/24/2019   No Known Allergies     Medication List    TAKE these medications   amphetamine-dextroamphetamine 30 MG 24 hr capsule Commonly known as: ADDERALL XR Take 30 mg by mouth daily.   aspirin EC 81 MG tablet Take 81 mg by mouth daily.   atorvastatin 80 MG tablet Commonly known as: LIPITOR Take 1 tablet (80 mg total) by mouth daily.   buPROPion 150 MG 24 hr tablet Commonly known as: WELLBUTRIN XL Take 150 mg by mouth daily.   cetirizine 10 MG tablet Commonly known as: ZYRTEC Take 10 mg by mouth daily.   CRANBERRY PO Take 2 capsules by mouth daily.   fluticasone 50 MCG/ACT nasal spray Commonly known as: FLONASE Place 1 spray into both nostrils daily.   hydrochlorothiazide 12.5 MG tablet Commonly known as: HYDRODIURIL TAKE 1 TABLET(12.5 MG) BY MOUTH DAILY What changed: See the new instructions.   ibuprofen 600 MG tablet Commonly known as: ADVIL take 1 tablet po pc every 6 hours for 5 days then as needed for post operative pain   metoprolol tartrate 25 MG  tablet Commonly known as: LOPRESSOR TAKE 1 TABLET(25 MG) BY MOUTH TWICE DAILY What changed: See the new instructions.   multivitamin tablet Take 1 tablet by mouth daily.   oxyCODONE-acetaminophen 5-325 MG tablet Commonly known as: PERCOCET/ROXICET take 1-2 tablets po every 6 hours as needed for breakthrough post operative pain   sulfamethoxazole-trimethoprim 800-160 MG tablet Commonly known as: Bactrim DS Take 1 tablet by mouth 2 (two) times daily for 3 days.   Valtrex 1000 MG tablet Generic drug: valACYclovir Take 1,000 mg by mouth daily as needed (fever blisters).   Venlafaxine HCl 225 MG Tb24 Take 225 mg by mouth daily with breakfast.   Viberzi 75 MG Tabs Generic drug: Eluxadoline Take 75 mg by mouth every other day.   Vitamin D 50 MCG (2000 UT) Caps Take 2,000 Units by mouth daily.   zolpidem 10 MG tablet Commonly known as: AMBIEN Take 10 mg by mouth at bedtime as needed for sleep.         Follow-up: Dr. Woodroe Mode. Su Hilt on July 07, 2019 at 4:30 p.m.     Signed: Purcell Nails, PA-C 06/24/2019, 12:39 PM

## 2019-06-24 NOTE — Progress Notes (Addendum)
Kimberly Castro is a58 y.o.  [id number]  Post Op Date # 1: A-P Colporrhaphy/Placement of TVT   Subjective: Patient is Doing well postoperatively. Patient has Pain is controlled with current analgesics. Medications being used: prescription NSAID's including Ketorolac 30 mg and narcotic analgesics including Percocet 5/325. The patient has ambulated in the halls without dizziness or unsteadiness and has tolerated a regular diet.  She has not voided yet.   Objective: Vital signs in last 24 hours: Temp:  [97.5 F (36.4 C)-98.6 F (37 C)] 97.5 F (36.4 C) (01/05 0431) Pulse Rate:  [49-71] 60 (01/05 0431) Resp:  [10-20] 20 (01/05 0007) BP: (104-149)/(48-77) 123/59 (01/05 0431) SpO2:  [20 %-100 %] 95 % (01/05 0431) Weight:  [154 kg] 137 kg (01/04 0757)  Intake/Output from previous day: 01/04 0701 - 01/05 0700 In: 1750 [I.V.:1700] Out: 850 [Urine:750] Intake/Output this shift: No intake/output data recorded. Recent Labs  Lab 06/19/19 0854 06/24/19 0621  WBC 8.3 15.1*  HGB 12.8 11.3*  HCT 40.8 35.5*  PLT 268 277     Recent Labs  Lab 06/19/19 0854  NA 142  K 3.9  CL 103  CO2 26  BUN 13  CREATININE 0.82  CALCIUM 8.9  GLUCOSE 101*    EXAM: General: alert, cooperative and no distress Resp: clear to auscultation bilaterally Cardio: RRR with no murmur GI: bowel sounds present, soft and mons incisions intact without evidence of infection Extremities: Homans sign is negative, no sign of DVT and no calf tenderness.   Assessment: s/p Procedure(s): ANTERIOR (CYSTOCELE) AND POSTERIOR REPAIR (RECTOCELE) TRANSVAGINAL TAPE (TVT) PROCEDURE: stable, progressing well and tolerating diet  Plan: Discharge home Awaiting patient to void  LOS: 0 days    Henreitta Leber, PA-C 06/24/2019 7:42 AM  Pt voided but it was not measured.  She said she voided about half a cup.  Her pain is well controlled.  She is unsure if she emptied her bladder.  At baseline she retains some urine.  PVR  in office 120cc.  Pt is voiding now and may need straight cath prior to discharge home.  D/c instructions reviewed.  Creatinine mildly elevated but likely due to some hypovolemia as it was fine on the 12/31.  Pt has been drinking lots of water all morning and IVF have been running.  Will discuss with her repeating BMET prior to discharge vs returning to office for repeat.

## 2019-06-24 NOTE — Discharge Instructions (Signed)
Call [facility] @ [phone] if: ° °You have a temperature greater than or equal to 100.4 degrees Farenheit orally °You have pain that is not made better by the pain medication given and taken as directed °You have excessive bleeding or problems urinating ° °Take Colace (Docusate Sodium/Stool Softener) 100 mg 2-3 times daily while taking narcotic pain medicine to avoid constipation or until bowel movements are regular. °Take Ibuprofen 600 mg with food every 6 hours for 5 days then as needed for pain ° °You may drive after 1 week °You may walk up steps ° °You may shower  °You may resume a regular diet ° °Keep incisions clean and dry °Do not lift over 15 pounds for 6 weeks °Avoid anything in vagina for 6 weeks (or until after your post-operative visit) ° °

## 2019-06-24 NOTE — Progress Notes (Signed)
Changed sanitary napkin twice with minimal serousangineous drainage noted, about 1/4 of the napkin soaked. Vaginal packing removed and is moderately soaked with dried blood, no active drainage noted. Foley Catheter discontinued with only output overnight. Will continue to monitor.

## 2019-06-24 NOTE — Progress Notes (Signed)
Pt voided 100cc now and had PVR of 50cc via straight catch.  Urine sent for cx.

## 2019-06-24 NOTE — Progress Notes (Signed)
Patient alert and oriented, mae's well, voiding adequate amount of urine, swallowing without difficulty, no c/o pain at time of discharge. Patient discharged home with family. Script and discharged instructions given to patient. Patient and family stated understanding of instructions given. Patient has an appointment with Dr. Su Hilt

## 2019-06-24 NOTE — Progress Notes (Signed)
I contacted pt just prior to her leaving and explained that her creatinine had increased from 1.09 to 1.37.  I recommended she stay overnight and receive IVF and repeat BMET in the morning.  Her urine is less concentrated but remains concentrated.  Pt however wanted to proceed with discharge as she had already gotten her clothes on and her husband was waiting for her.  Pt got to the car and discussed it with her husband and he wanted her to stay so she called me to let me know.  I called Marin Elidia Bonenfant, charge nurse who was going to call Roddie Mc to find out the best way to handle this.

## 2019-06-25 DIAGNOSIS — R338 Other retention of urine: Secondary | ICD-10-CM | POA: Diagnosis not present

## 2019-06-25 LAB — URINE CULTURE: Culture: NO GROWTH

## 2019-06-25 NOTE — Anesthesia Postprocedure Evaluation (Signed)
Anesthesia Post Note  Patient: Kimberly Castro  Procedure(s) Performed: ANTERIOR (CYSTOCELE) AND POSTERIOR REPAIR (RECTOCELE) (N/A Vagina ) TRANSVAGINAL TAPE (TVT) PROCEDURE (N/A Perineum)     Patient location during evaluation: PACU Anesthesia Type: General Level of consciousness: awake and alert Pain management: pain level controlled Vital Signs Assessment: post-procedure vital signs reviewed and stable Respiratory status: spontaneous breathing, nonlabored ventilation, respiratory function stable and patient connected to nasal cannula oxygen Cardiovascular status: blood pressure returned to baseline and stable Postop Assessment: no apparent nausea or vomiting Anesthetic complications: no    Last Vitals:  Vitals:   06/24/19 0742 06/24/19 1242  BP: (!) 115/58 (!) 108/47  Pulse: 62 65  Resp: 16 16  Temp: 36.7 C 37.1 C  SpO2: 93% 94%    Last Pain:  Vitals:   06/24/19 1242  TempSrc: Oral  PainSc:                  Matilda Fleig S

## 2019-06-27 DIAGNOSIS — R7989 Other specified abnormal findings of blood chemistry: Secondary | ICD-10-CM | POA: Diagnosis not present

## 2019-06-27 DIAGNOSIS — R32 Unspecified urinary incontinence: Secondary | ICD-10-CM | POA: Diagnosis not present

## 2019-07-01 DIAGNOSIS — R7989 Other specified abnormal findings of blood chemistry: Secondary | ICD-10-CM | POA: Diagnosis not present

## 2019-07-04 DIAGNOSIS — R7989 Other specified abnormal findings of blood chemistry: Secondary | ICD-10-CM | POA: Diagnosis not present

## 2019-07-07 DIAGNOSIS — R7989 Other specified abnormal findings of blood chemistry: Secondary | ICD-10-CM | POA: Diagnosis not present

## 2019-07-07 DIAGNOSIS — N390 Urinary tract infection, site not specified: Secondary | ICD-10-CM | POA: Diagnosis not present

## 2019-07-10 ENCOUNTER — Other Ambulatory Visit: Payer: Self-pay | Admitting: Cardiovascular Disease

## 2019-07-14 DIAGNOSIS — N390 Urinary tract infection, site not specified: Secondary | ICD-10-CM | POA: Diagnosis not present

## 2019-07-14 DIAGNOSIS — R7989 Other specified abnormal findings of blood chemistry: Secondary | ICD-10-CM | POA: Diagnosis not present

## 2019-07-21 DIAGNOSIS — M25562 Pain in left knee: Secondary | ICD-10-CM | POA: Diagnosis not present

## 2019-07-21 DIAGNOSIS — M170 Bilateral primary osteoarthritis of knee: Secondary | ICD-10-CM | POA: Diagnosis not present

## 2019-07-21 DIAGNOSIS — M25561 Pain in right knee: Secondary | ICD-10-CM | POA: Diagnosis not present

## 2019-07-21 DIAGNOSIS — Z6841 Body Mass Index (BMI) 40.0 and over, adult: Secondary | ICD-10-CM | POA: Diagnosis not present

## 2019-07-22 DIAGNOSIS — N390 Urinary tract infection, site not specified: Secondary | ICD-10-CM | POA: Diagnosis not present

## 2019-07-31 DIAGNOSIS — N390 Urinary tract infection, site not specified: Secondary | ICD-10-CM | POA: Diagnosis not present

## 2019-08-13 DIAGNOSIS — R7989 Other specified abnormal findings of blood chemistry: Secondary | ICD-10-CM | POA: Diagnosis not present

## 2019-08-13 DIAGNOSIS — Z862 Personal history of diseases of the blood and blood-forming organs and certain disorders involving the immune mechanism: Secondary | ICD-10-CM | POA: Diagnosis not present

## 2019-08-13 DIAGNOSIS — N390 Urinary tract infection, site not specified: Secondary | ICD-10-CM | POA: Diagnosis not present

## 2019-08-20 ENCOUNTER — Telehealth: Payer: BC Managed Care – PPO | Admitting: Cardiovascular Disease

## 2019-08-25 DIAGNOSIS — G4733 Obstructive sleep apnea (adult) (pediatric): Secondary | ICD-10-CM | POA: Diagnosis not present

## 2019-09-03 DIAGNOSIS — G4733 Obstructive sleep apnea (adult) (pediatric): Secondary | ICD-10-CM | POA: Diagnosis not present

## 2019-09-16 DIAGNOSIS — R339 Retention of urine, unspecified: Secondary | ICD-10-CM | POA: Diagnosis not present

## 2019-09-16 DIAGNOSIS — N390 Urinary tract infection, site not specified: Secondary | ICD-10-CM | POA: Diagnosis not present

## 2019-09-18 DIAGNOSIS — R309 Painful micturition, unspecified: Secondary | ICD-10-CM | POA: Diagnosis not present

## 2019-09-18 DIAGNOSIS — N898 Other specified noninflammatory disorders of vagina: Secondary | ICD-10-CM | POA: Diagnosis not present

## 2019-09-18 DIAGNOSIS — B373 Candidiasis of vulva and vagina: Secondary | ICD-10-CM | POA: Diagnosis not present

## 2019-09-26 DIAGNOSIS — N3020 Other chronic cystitis without hematuria: Secondary | ICD-10-CM | POA: Diagnosis not present

## 2019-09-26 DIAGNOSIS — R350 Frequency of micturition: Secondary | ICD-10-CM | POA: Diagnosis not present

## 2019-10-22 DIAGNOSIS — R309 Painful micturition, unspecified: Secondary | ICD-10-CM | POA: Diagnosis not present

## 2019-10-29 DIAGNOSIS — R102 Pelvic and perineal pain: Secondary | ICD-10-CM | POA: Diagnosis not present

## 2019-10-29 DIAGNOSIS — R350 Frequency of micturition: Secondary | ICD-10-CM | POA: Diagnosis not present

## 2019-10-29 DIAGNOSIS — N3010 Interstitial cystitis (chronic) without hematuria: Secondary | ICD-10-CM | POA: Diagnosis not present

## 2019-11-01 ENCOUNTER — Other Ambulatory Visit: Payer: Self-pay | Admitting: Cardiovascular Disease

## 2019-11-13 DIAGNOSIS — R945 Abnormal results of liver function studies: Secondary | ICD-10-CM | POA: Diagnosis not present

## 2019-11-13 DIAGNOSIS — K580 Irritable bowel syndrome with diarrhea: Secondary | ICD-10-CM | POA: Diagnosis not present

## 2019-11-13 DIAGNOSIS — Z1211 Encounter for screening for malignant neoplasm of colon: Secondary | ICD-10-CM | POA: Diagnosis not present

## 2019-11-13 DIAGNOSIS — Z86010 Personal history of colonic polyps: Secondary | ICD-10-CM | POA: Diagnosis not present

## 2019-11-14 ENCOUNTER — Other Ambulatory Visit: Payer: Self-pay | Admitting: Gastroenterology

## 2019-11-14 DIAGNOSIS — Z86010 Personal history of colonic polyps: Secondary | ICD-10-CM

## 2019-11-14 DIAGNOSIS — R102 Pelvic and perineal pain: Secondary | ICD-10-CM | POA: Diagnosis not present

## 2019-11-18 ENCOUNTER — Other Ambulatory Visit: Payer: Self-pay | Admitting: Gastroenterology

## 2019-11-18 DIAGNOSIS — R7989 Other specified abnormal findings of blood chemistry: Secondary | ICD-10-CM

## 2019-11-19 ENCOUNTER — Ambulatory Visit
Admission: RE | Admit: 2019-11-19 | Discharge: 2019-11-19 | Disposition: A | Discharge status: Home / Self Care | Payer: BC Managed Care – PPO | Source: Ambulatory Visit | Attending: Gastroenterology | Admitting: Gastroenterology

## 2019-11-19 DIAGNOSIS — R7989 Other specified abnormal findings of blood chemistry: Secondary | ICD-10-CM

## 2019-11-19 DIAGNOSIS — K7689 Other specified diseases of liver: Secondary | ICD-10-CM | POA: Diagnosis not present

## 2019-11-28 ENCOUNTER — Other Ambulatory Visit: Payer: Self-pay

## 2019-11-28 ENCOUNTER — Encounter: Payer: Self-pay | Admitting: Physician Assistant

## 2019-11-28 ENCOUNTER — Ambulatory Visit (INDEPENDENT_AMBULATORY_CARE_PROVIDER_SITE_OTHER): Payer: BC Managed Care – PPO | Admitting: Physician Assistant

## 2019-11-28 VITALS — BP 148/80 | HR 62 | Ht 66.0 in | Wt 320.0 lb

## 2019-11-28 DIAGNOSIS — F4010 Social phobia, unspecified: Secondary | ICD-10-CM | POA: Diagnosis not present

## 2019-11-28 DIAGNOSIS — F5105 Insomnia due to other mental disorder: Secondary | ICD-10-CM | POA: Diagnosis not present

## 2019-11-28 DIAGNOSIS — F99 Mental disorder, not otherwise specified: Secondary | ICD-10-CM

## 2019-11-28 DIAGNOSIS — F331 Major depressive disorder, recurrent, moderate: Secondary | ICD-10-CM | POA: Diagnosis not present

## 2019-11-28 MED ORDER — BUPROPION HCL ER (XL) 300 MG PO TB24
300.0000 mg | ORAL_TABLET | Freq: Every day | ORAL | 1 refills | Status: DC
Start: 2019-11-28 — End: 2020-02-02

## 2019-11-28 MED ORDER — HYDROXYZINE HCL 10 MG PO TABS: 10.0000 mg | ORAL_TABLET | Freq: Three times a day (TID) | ORAL | 1 refills | Status: DC | PRN
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
## 2019-12-03 DIAGNOSIS — G4733 Obstructive sleep apnea (adult) (pediatric): Secondary | ICD-10-CM | POA: Diagnosis not present

## 2019-12-08 DIAGNOSIS — F988 Other specified behavioral and emotional disorders with onset usually occurring in childhood and adolescence: Secondary | ICD-10-CM | POA: Diagnosis not present

## 2019-12-08 DIAGNOSIS — E7800 Pure hypercholesterolemia, unspecified: Secondary | ICD-10-CM | POA: Diagnosis not present

## 2019-12-08 DIAGNOSIS — R5383 Other fatigue: Secondary | ICD-10-CM | POA: Diagnosis not present

## 2019-12-08 DIAGNOSIS — F331 Major depressive disorder, recurrent, moderate: Secondary | ICD-10-CM | POA: Diagnosis not present

## 2019-12-08 DIAGNOSIS — G4700 Insomnia, unspecified: Secondary | ICD-10-CM | POA: Diagnosis not present

## 2019-12-09 ENCOUNTER — Inpatient Hospital Stay: Admission: RE | Admit: 2019-12-09 | Payer: BC Managed Care – PPO | Source: Ambulatory Visit

## 2019-12-31 ENCOUNTER — Other Ambulatory Visit: Payer: Self-pay | Admitting: Gastroenterology

## 2020-01-02 ENCOUNTER — Other Ambulatory Visit: Payer: BC Managed Care – PPO

## 2020-01-05 ENCOUNTER — Inpatient Hospital Stay: Admission: RE | Admit: 2020-01-05 | Payer: BC Managed Care – PPO | Source: Ambulatory Visit

## 2020-01-09 ENCOUNTER — Ambulatory Visit: Payer: BC Managed Care – PPO | Admitting: Physician Assistant

## 2020-01-30 ENCOUNTER — Ambulatory Visit
Admission: RE | Admit: 2020-01-30 | Discharge: 2020-01-30 | Disposition: A | Discharge status: Home / Self Care | Payer: BC Managed Care – PPO | Source: Ambulatory Visit | Attending: Gastroenterology | Admitting: Gastroenterology

## 2020-01-30 DIAGNOSIS — I700 Atherosclerosis of aorta: Secondary | ICD-10-CM | POA: Diagnosis not present

## 2020-01-30 DIAGNOSIS — Z86010 Personal history of colonic polyps: Secondary | ICD-10-CM

## 2020-01-30 DIAGNOSIS — K429 Umbilical hernia without obstruction or gangrene: Secondary | ICD-10-CM | POA: Diagnosis not present

## 2020-01-30 DIAGNOSIS — K3189 Other diseases of stomach and duodenum: Secondary | ICD-10-CM | POA: Diagnosis not present

## 2020-01-30 DIAGNOSIS — K449 Diaphragmatic hernia without obstruction or gangrene: Secondary | ICD-10-CM | POA: Diagnosis not present

## 2020-02-02 ENCOUNTER — Ambulatory Visit (INDEPENDENT_AMBULATORY_CARE_PROVIDER_SITE_OTHER): Payer: BC Managed Care – PPO | Admitting: Physician Assistant

## 2020-02-02 ENCOUNTER — Other Ambulatory Visit: Payer: Self-pay

## 2020-02-02 ENCOUNTER — Encounter: Payer: Self-pay | Admitting: Physician Assistant

## 2020-02-02 DIAGNOSIS — F99 Mental disorder, not otherwise specified: Secondary | ICD-10-CM

## 2020-02-02 DIAGNOSIS — F331 Major depressive disorder, recurrent, moderate: Secondary | ICD-10-CM | POA: Diagnosis not present

## 2020-02-02 DIAGNOSIS — F5105 Insomnia due to other mental disorder: Secondary | ICD-10-CM | POA: Diagnosis not present

## 2020-02-02 DIAGNOSIS — F4010 Social phobia, unspecified: Secondary | ICD-10-CM | POA: Diagnosis not present

## 2020-02-02 MED ORDER — BUPROPION HCL ER (XL) 300 MG PO TB24: 300.0000 mg | ORAL_TABLET | Freq: Every day | ORAL | 1 refills | Status: DC

## 2020-02-02 NOTE — Progress Notes (Signed)
[facility] Med Check  Patient ID: EMREE Kimberly Castro,  MRN: [id number]  PCP: Sigmund Hazel, MD  Date of Evaluation: 02/02/2020 Time spent:20 minutes  Chief Complaint:  Chief Complaint    Follow-up      HISTORY/CURRENT STATUS: HPI For f/u med check.  "I'm starting to feel like my normal self. I'm at least 70% better now!"  She has been on the current medications at these doses for about 2 months now.  She is more able to enjoy things.  Is not getting nearly as anxious and has only taken the hydroxyzine a couple of times.  It did make her a little drowsy but it did help a little bit.  Not isolating.  Work is going okay.  Denies suicidal or homicidal thoughts.  She sleeps well with the Ambien.  Her PCP is prescribing all of her meds except for the Wellbutrin and hydroxyzine.  Patient denies increased energy with decreased need for sleep, no increased talkativeness, no racing thoughts, no impulsivity or risky behaviors, no increased spending, no increased libido, no grandiosity, no increased irritability or anger, and no hallucinations.  Denies dizziness, syncope, seizures, numbness, tingling, tremor, tics, unsteady gait, slurred speech, confusion. Denies muscle or joint pain, stiffness, or dystonia.   Individual Medical History/ Review of Systems: Changes? :Yes  Had abnormal LFTs last month, then CT scan    Past medications for mental health diagnoses include: Paxil, Advil PM  Allergies: Patient has no known allergies.  Current Medications:  Current Outpatient Medications:  .  amphetamine-dextroamphetamine (ADDERALL XR) 30 MG 24 hr capsule, Take 30 mg by mouth daily. , Disp: , Rfl:  .  atorvastatin (LIPITOR) 80 MG tablet, Take 1 tablet (80 mg total) by mouth daily., Disp: 90 tablet, Rfl: 3 .  buPROPion (WELLBUTRIN XL) 300 MG 24 hr tablet, Take 1 tablet (300 mg total) by mouth daily., Disp: 90 tablet, Rfl: 1 .  cetirizine (ZYRTEC) 10 MG tablet, Take 10 mg by mouth daily., Disp:  , Rfl:  .  Cholecalciferol (VITAMIN D) 50 MCG (2000 UT) CAPS, Take 2,000 Units by mouth daily., Disp: , Rfl:  .  CRANBERRY PO, Take 2 capsules by mouth daily. , Disp: , Rfl:  .  Eluxadoline (VIBERZI) 75 MG TABS, Take 75 mg by mouth every other day. , Disp: , Rfl:  .  fluticasone (FLONASE) 50 MCG/ACT nasal spray, Place 1 spray into both nostrils daily. , Disp: , Rfl: 1 .  hydrochlorothiazide (HYDRODIURIL) 12.5 MG tablet, TAKE 1 TABLET(12.5 MG) BY MOUTH DAILY, Disp: 60 tablet, Rfl: 3 .  hydrOXYzine (ATARAX/VISTARIL) 10 MG tablet, Take 1-3 tablets (10-30 mg total) by mouth 3 (three) times daily as needed., Disp: 60 tablet, Rfl: 1 .  metoprolol tartrate (LOPRESSOR) 25 MG tablet, Take 1 tablet (25 mg total) by mouth 2 (two) times daily., Disp: 180 tablet, Rfl: 3 .  Multiple Vitamin (MULTIVITAMIN) tablet, Take 1 tablet by mouth daily., Disp: , Rfl:  .  valACYclovir (VALTREX) 1000 MG tablet, Take 1,000 mg by mouth daily as needed (fever blisters). , Disp: , Rfl:  .  Venlafaxine HCl 225 MG TB24, Take 225 mg by mouth daily with breakfast. , Disp: , Rfl: 1 .  zolpidem (AMBIEN) 10 MG tablet, Take 10 mg by mouth at bedtime as needed for sleep. , Disp: , Rfl: 0 .  oxyCODONE-acetaminophen (PERCOCET/ROXICET) 5-325 MG tablet, take 1-2 tablets po every 6 hours as needed for breakthrough post operative pain (Patient not taking: Reported on 11/28/2019), Disp: 28 tablet,  Rfl: 0 Medication Side Effects: none  Family Medical/ Social History: Changes? No  MENTAL HEALTH EXAM:  There were no vitals taken for this visit.There is no height or weight on file to calculate BMI.  General Appearance: Casual, Neat, Well Groomed and Obese  Eye Contact:  Good  Speech:  Clear and Coherent and Normal Rate  Volume:  Normal  Mood:  Euthymic  Affect:  Appropriate  Thought Process:  Goal Directed and Descriptions of Associations: Intact  Orientation:  Full (Time, Place, and Person)  Thought Content: Logical   Suicidal Thoughts:   No  Homicidal Thoughts:  No  Memory:  WNL  Judgement:  Good  Insight:  Good  Psychomotor Activity:  Normal  Concentration:  Concentration: Good  Recall:  Good  Fund of Knowledge: Good  Language: Good  Assets:  Desire for Improvement  ADL's:  Intact  Cognition: WNL  Prognosis:  Good    DIAGNOSES:    ICD-10-CM   1. Major depressive disorder, recurrent episode, moderate (HCC)  F33.1   2. Social anxiety disorder  F40.10   3. Insomnia due to other mental disorder  F51.05    F99     Receiving Psychotherapy: No    RECOMMENDATIONS:  PDMP reviewed. I provided 20 mins of face to face time during this encounter. I'm glad to see her doing so well! Cont Wellbutrin XL 300 mg, 1 q am. Cont Hydroxyzine 10 mg, 1-3 (or even up to 5) q 8h prn Continue Adderall XR 30 mg q am. (per PCP) Continue Effexor 225 mg 1 p.o. daily. Continue Ambien 10 mg, 1 p.o. nightly as needed. Recommend counseling.  Patient was given several names for counselors out in the community. Return in 3 months.  Kimberly Overly, PA-C

## 2020-02-26 DIAGNOSIS — H9203 Otalgia, bilateral: Secondary | ICD-10-CM | POA: Diagnosis not present

## 2020-02-26 DIAGNOSIS — H903 Sensorineural hearing loss, bilateral: Secondary | ICD-10-CM | POA: Diagnosis not present

## 2020-02-26 DIAGNOSIS — H6121 Impacted cerumen, right ear: Secondary | ICD-10-CM | POA: Diagnosis not present

## 2020-02-27 DIAGNOSIS — M79642 Pain in left hand: Secondary | ICD-10-CM | POA: Diagnosis not present

## 2020-02-27 DIAGNOSIS — M13842 Other specified arthritis, left hand: Secondary | ICD-10-CM | POA: Diagnosis not present

## 2020-02-28 ENCOUNTER — Other Ambulatory Visit: Payer: Self-pay | Admitting: Cardiovascular Disease

## 2020-03-02 DIAGNOSIS — G4733 Obstructive sleep apnea (adult) (pediatric): Secondary | ICD-10-CM | POA: Diagnosis not present

## 2020-03-09 DIAGNOSIS — N3010 Interstitial cystitis (chronic) without hematuria: Secondary | ICD-10-CM | POA: Diagnosis not present

## 2020-03-09 DIAGNOSIS — R339 Retention of urine, unspecified: Secondary | ICD-10-CM | POA: Diagnosis not present

## 2020-03-09 DIAGNOSIS — G8929 Other chronic pain: Secondary | ICD-10-CM | POA: Diagnosis not present

## 2020-03-09 DIAGNOSIS — R102 Pelvic and perineal pain: Secondary | ICD-10-CM | POA: Diagnosis not present

## 2020-03-15 DIAGNOSIS — Z713 Dietary counseling and surveillance: Secondary | ICD-10-CM | POA: Diagnosis not present

## 2020-03-15 DIAGNOSIS — Z6841 Body Mass Index (BMI) 40.0 and over, adult: Secondary | ICD-10-CM | POA: Diagnosis not present

## 2020-03-19 DIAGNOSIS — M79642 Pain in left hand: Secondary | ICD-10-CM | POA: Diagnosis not present

## 2020-03-19 DIAGNOSIS — M1812 Unilateral primary osteoarthritis of first carpometacarpal joint, left hand: Secondary | ICD-10-CM | POA: Diagnosis not present

## 2020-04-01 DIAGNOSIS — L720 Epidermal cyst: Secondary | ICD-10-CM | POA: Diagnosis not present

## 2020-04-01 DIAGNOSIS — D2262 Melanocytic nevi of left upper limb, including shoulder: Secondary | ICD-10-CM | POA: Diagnosis not present

## 2020-04-01 DIAGNOSIS — L738 Other specified follicular disorders: Secondary | ICD-10-CM | POA: Diagnosis not present

## 2020-04-01 DIAGNOSIS — L821 Other seborrheic keratosis: Secondary | ICD-10-CM | POA: Diagnosis not present

## 2020-04-05 DIAGNOSIS — N390 Urinary tract infection, site not specified: Secondary | ICD-10-CM | POA: Diagnosis not present

## 2020-04-05 DIAGNOSIS — N3946 Mixed incontinence: Secondary | ICD-10-CM | POA: Diagnosis not present

## 2020-04-08 DIAGNOSIS — N3941 Urge incontinence: Secondary | ICD-10-CM | POA: Diagnosis not present

## 2020-04-19 DIAGNOSIS — M170 Bilateral primary osteoarthritis of knee: Secondary | ICD-10-CM | POA: Diagnosis not present

## 2020-04-19 DIAGNOSIS — M79642 Pain in left hand: Secondary | ICD-10-CM | POA: Diagnosis not present

## 2020-04-21 DIAGNOSIS — Z1231 Encounter for screening mammogram for malignant neoplasm of breast: Secondary | ICD-10-CM | POA: Diagnosis not present

## 2020-05-03 ENCOUNTER — Telehealth: Payer: BC Managed Care – PPO | Admitting: Physician Assistant

## 2020-05-09 ENCOUNTER — Encounter (INDEPENDENT_AMBULATORY_CARE_PROVIDER_SITE_OTHER): Payer: Self-pay

## 2020-05-18 DIAGNOSIS — M25562 Pain in left knee: Secondary | ICD-10-CM | POA: Diagnosis not present

## 2020-05-18 DIAGNOSIS — M79642 Pain in left hand: Secondary | ICD-10-CM | POA: Diagnosis not present

## 2020-05-24 DIAGNOSIS — R350 Frequency of micturition: Secondary | ICD-10-CM | POA: Diagnosis not present

## 2020-05-24 DIAGNOSIS — R3915 Urgency of urination: Secondary | ICD-10-CM | POA: Diagnosis not present

## 2020-05-24 DIAGNOSIS — N3941 Urge incontinence: Secondary | ICD-10-CM | POA: Diagnosis not present

## 2020-05-24 DIAGNOSIS — Z9682 Presence of neurostimulator: Secondary | ICD-10-CM | POA: Diagnosis not present

## 2020-05-24 NOTE — Unmapped External Note (Signed)
 DATE: 05/24/2020  SURGEON: Lamar Lynwood Sar, MD  RESIDENT: Omega Gretel Rouleau, MD Amr Margart Sierras MD  PRE-OPERATIVE DIAGNOSIS: Severe urinary urgency/frequency, failed medical therapy  POST-OPERATIVE DIAGNOSIS: Same  PROCEDURE: 1. InterStim Peripheral Nerve Stimulation Stage 1- Incision and implantation of tined quadripolar lead electrodes into S3 Foramen  with fluoroscopic guidance for needle placement 2. Intraoperative Fluoroscopy with Interpretation (<1 hour)  ANESTHESIA: Sedation with Local  EBL: Minimal  Side of Lead Placement: Left  INDICATIONS: Kimberly Castro is a 59 y.o. female with a history of severe urinary urgency/frequency, treatment of which has been refractory to medical therapy.  The patient has discussed the management options for this according to AUA Guidelines, and has elected to proceed with an Interstim Stage 1 procedure. The patient was brought to the operative suite for this reason.   PROCEDURE NARRATIVE: Patient was properly identified and placed in prone position.  A time out was performed to ensure the appropriate patient, procedure, site and side according to [facility] Protocol.  Sedation was administered.  Pillows were placed under lower abdomen to flatten sacrum and under shins to allow the toes to dangle freely.  Tape was placed on each buttock and gently pulled laterally to separate cheeks adequately to visualize anal sphincter.  Patient was prepped and draped in usual sterile fashion using chloraprep solution.  The C-arm was moved into position to provide fluoroscopy and the midline of the vertebrae, SI notches and medial foramenal borders were marked.  The C-arm was moved to lateral position to image the area from sacral promontory to the coccyx.  Local injection of 1% Lidocaine  was administered.     A introducer needle was introduced approximately 2 cm above SI notch and 2 cm lateral to vertebral midline, feeling for foramenal margins until the S3  foramen was identified and penetrated.  The depth of the needle was confirmed and adjusted fluoroscopically.  Proper needle position was confirmed by direct observation of the lifting of the perineum or "bellowing"; and observation of plantar flexion of the great toe utilizing the external test stimulator box.    The needle stylet was removed and a directional guide wire was placed and confirmed fluoroscopically.  The foramen needle was removed.   An incision was made peripherally to the directional guide wire through the fascial layer.  The dilator and introducer sheath was placed over the directional guide wire and directed into the foramen until the opaque marker of the dilator was seen on the anterior rim of the sacrum.  The dilator obturator was unlocked and removed.  The lead was then placed through the introducer sheath to the first white line.  Position was checked fluoroscopically.  The lead was then further introduced until 3 electrodes were visible below the sacrum.  Each electrode was tested for visualization of bellows and plantar flexion of the great toe.  After satisfactory positioning was confirmed, the introducer sheath was retracted, deploying lead tines into the perisacral tissue.  Further incision was made into subcutaneous tissue on the patient's left side posterior to the iliac crest, and blunt dissection was continued until the gluteal fascia was identified; hemostasis was achieved.  A tunneling tool and tube was placed from the lead placement incision, and passed subcutaneously to the incised pocket site.  The tunneling tool was removed and the lead was fed through the tube and pulled out at the pocket site.  The lead was cleansed of bodily fluids. The tunneling tool was then used to bring the  lead out on the right side.  The lead was inserted into the connector and the screw was tightened.    The wounds were irrigated with antibiotic solution in water and closed with 2-0 vicryl  subcutaneously, and then with a 3-0 nylon suture to close the skin.  Counts were correct.  Telfa and tegaderm were placed on the patient's incisions.  The procedure was terminated at this time.  DISPOSITION: The patient was transferred to the PACU in satisfactory condition.  Using the external test stimulator, the patient will be programmed in the PACU to the lead of optimum sensation, and given instructions on utilizing the external test stimulator prior to discharge.  A urinary diary will be kept until return appointment to discuss results of this test stimulation. If the patient's symptoms are improved, then we will proceed with Stage II placement in 1-2 weeks.      Electronically signed by: Omega Gretel Rouleau, MD Resident 05/24/20 1004 I was present for the procedure and agree with the note as written    Electronically signed by: Lamar Lynwood Sar, MD 05/24/20 1006

## 2020-05-30 ENCOUNTER — Other Ambulatory Visit: Payer: Self-pay | Admitting: Cardiovascular Disease

## 2020-05-31 DIAGNOSIS — G4733 Obstructive sleep apnea (adult) (pediatric): Secondary | ICD-10-CM | POA: Diagnosis not present

## 2020-06-01 DIAGNOSIS — N3941 Urge incontinence: Secondary | ICD-10-CM | POA: Diagnosis not present

## 2020-06-01 DIAGNOSIS — Z09 Encounter for follow-up examination after completed treatment for conditions other than malignant neoplasm: Secondary | ICD-10-CM | POA: Diagnosis not present

## 2020-06-01 DIAGNOSIS — N3010 Interstitial cystitis (chronic) without hematuria: Secondary | ICD-10-CM | POA: Diagnosis not present

## 2020-06-07 DIAGNOSIS — N3941 Urge incontinence: Secondary | ICD-10-CM | POA: Diagnosis not present

## 2020-06-07 DIAGNOSIS — R350 Frequency of micturition: Secondary | ICD-10-CM | POA: Diagnosis not present

## 2020-06-07 DIAGNOSIS — N3010 Interstitial cystitis (chronic) without hematuria: Secondary | ICD-10-CM | POA: Diagnosis not present

## 2020-06-07 DIAGNOSIS — R3915 Urgency of urination: Secondary | ICD-10-CM | POA: Diagnosis not present

## 2020-06-10 ENCOUNTER — Encounter: Payer: Self-pay | Admitting: Cardiovascular Disease

## 2020-06-10 ENCOUNTER — Telehealth (INDEPENDENT_AMBULATORY_CARE_PROVIDER_SITE_OTHER): Payer: BC Managed Care – PPO | Admitting: Cardiovascular Disease

## 2020-06-10 VITALS — BP 138/70 | Ht 66.0 in | Wt 318.0 lb

## 2020-06-10 DIAGNOSIS — E7800 Pure hypercholesterolemia, unspecified: Secondary | ICD-10-CM | POA: Diagnosis not present

## 2020-06-10 DIAGNOSIS — I10 Essential (primary) hypertension: Secondary | ICD-10-CM | POA: Diagnosis not present

## 2020-06-10 DIAGNOSIS — G4733 Obstructive sleep apnea (adult) (pediatric): Secondary | ICD-10-CM

## 2020-06-10 DIAGNOSIS — R5383 Other fatigue: Secondary | ICD-10-CM

## 2020-06-10 HISTORY — DX: Other fatigue: R53.83

## 2020-06-10 NOTE — Patient Instructions (Signed)
Medication Instructions:  Hold Atorvastatin for two weeks. Let us know if your fatigue improves. If not, restart atorvastatin 80 mg daily.   *If you need a refill on your cardiac medications before your next appointment, please call your pharmacy*   Lab Work: None  Testing/Procedures: None   Follow-Up: At [facility], you and your health needs are our priority.  As part of our continuing mission to provide you with exceptional heart care, we have created designated Provider Care Teams.  These Care Teams include your primary Cardiologist (physician) and Advanced Practice Providers (APPs -  Physician Assistants and Nurse Practitioners) who all work together to provide you with the care you need, when you need it.  We recommend signing up for the patient portal called "MyChart".  Sign up information is provided on this After Visit Summary.  MyChart is used to connect with patients for Virtual Visits (Telemedicine).  Patients are able to view lab/test results, encounter notes, upcoming appointments, etc.  Non-urgent messages can be sent to your provider as well.   To learn more about what you can do with MyChart, go to [email or web address].    Your next appointment:   1 year(s)  The format for your next appointment:   In Person  Provider:   Chilton Si, MD

## 2020-06-10 NOTE — Progress Notes (Signed)
Virtual Visit via Video Note   This visit type was conducted due to national recommendations for restrictions regarding the COVID-19 Pandemic (e.g. social distancing) in an effort to limit this patient's exposure and mitigate transmission in our community.  Due to her co-morbid illnesses, this patient is at least at moderate risk for complications without adequate follow up.  This format is felt to be most appropriate for this patient at this time.  All issues noted in this document were discussed and addressed.  A limited physical exam was performed with this format.  Please refer to the patient's chart for her consent to telehealth for [facility].   The patient was identified using 2 identifiers.  Date:  06/10/2020   ID:  Kimberly Castro, DOB Jun 03, 1961, MRN [id number]  Patient Location: Other:  work Provider Location: Office/Clinic  PCP:  Sigmund HazelMiller, Lisa, MD  Cardiologist:  Chilton Siiffany Naguabo, MD  Electrophysiologist:  None   Evaluation Performed:  Follow-Up Visit  Chief Complaint:  fatigue  History of Present Illness:    Kimberly Castro is a 59 y.o. female with OSA on CPAP, hyperlipidemia, hypertension and fibromyalgia who presents for follow-up.  She was first seen 03/2016 for an evaluation of palpitations.  She was referred for 14-day event monitor that showed PACs and PVCs.  She had laboratory testing that revealed normal blood counts and normal electrolytes. 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  Her thyroid function was normal. D-dimer was mildly elevated but she had a subsequent CT angiogram of the chest that was negative for PE.  She had an echo 03/2017 that revealed LVEF 55-60%.  At her last appointment she reported exertional dyspnea.  [facility] that revealed LVEF 55% and breast attenuation artifact but no ischemia.  At her last appointment [name] reported exertional dyspnea.  She had a  [facility] 05/2019 that revealed LVEF 60% and no ischemia.  She also had an echocardiogram at that time that revealed LVEF 61% with grade 2 diastolic dysfunction.  Right atrial pressure was 8 mmHg.  [name] has struggled with recurrent UTIs.  She had two procedures to repair of her cystocele and rectocele.  The most recent was last week.  She is recovering well.  Her main complaint today is fatigue.  Thyroid function was checked last year when she had a solid complaint and was normal.  She has been reading that atorvastatin can cause fatigue and wonders if this is contributing.  She denies feeling depressed.  She continues to use her CPAP regularly and sleeps well at night.  She has not had any chest pain and her breathing has been stable.  She denies lower extremity edema, orthopnea, or PND.  She does not check her blood pressure at home.  However she has had many OB appointments lately and it has all been normal.   The patient does not have symptoms concerning for COVID-19 infection (fever, chills, cough, or new shortness of breath).    Past Medical History:  Diagnosis Date  . ADD (attention deficit disorder)   . Anemia   . Chronic fatigue   . Complication of anesthesia    during colonoscopy BP dropped very low  . Depression   . Fatigue 06/10/2020  . Fibromyalgia   . Heart murmur    no problems with it  . Hypertension   . IBS (irritable bowel syndrome)   . Palpitations 03/31/2016  anxiety related  . Sleep apnea    uses cpap    Past Surgical History:  Procedure Laterality Date  . ANTERIOR AND POSTERIOR REPAIR N/A 06/23/2019   Procedure: ANTERIOR (CYSTOCELE) AND POSTERIOR REPAIR (RECTOCELE);  Surgeon: Osborn Coho, MD;  Location: [facility] OR;  Service: Gynecology;  Laterality: N/A;  3 hours  . BLADDER SUSPENSION N/A 06/23/2019   Procedure: TRANSVAGINAL TAPE (TVT) PROCEDURE;  Surgeon: Osborn Coho, MD;  Location: [facility] OR;  Service: Gynecology;  Laterality: N/A;  . COLONOSCOPY     . GASTRIC BYPASS    . LAPAROSCOPY N/A 08/12/2016   Procedure: LAPAROSCOPY DIAGNOSTIC, POSSIBLE LAPAROTOMY, POSSIBLE BOWEL RESECTION;  Surgeon: Glenna Fellows, MD;  Location: [facility] OR;  Service: General;  Laterality: N/A;  . PARTIAL HYSTERECTOMY     still has ovaries  . TONSILLECTOMY    . WISDOM TOOTH EXTRACTION       Current Outpatient Medications  Medication Sig Dispense Refill  . amphetamine-dextroamphetamine (ADDERALL XR) 30 MG 24 hr capsule Take 30 mg by mouth daily.     Marland Kitchen atorvastatin (LIPITOR) 80 MG tablet TAKE 1 TABLET(80 MG) BY MOUTH DAILY 90 tablet 1  . buPROPion (WELLBUTRIN XL) 300 MG 24 hr tablet Take 1 tablet (300 mg total) by mouth daily. 90 tablet 1  . cetirizine (ZYRTEC) 10 MG tablet Take 10 mg by mouth daily.    . Cholecalciferol (VITAMIN D) 50 MCG (2000 UT) CAPS Take 2,000 Units by mouth daily.    . Eluxadoline (VIBERZI) 75 MG TABS Take 75 mg by mouth as needed.    Marland Kitchen estradiol (ESTRACE) 0.1 MG/GM vaginal cream Place 1 Applicatorful vaginally at bedtime.    . fluticasone (FLONASE) 50 MCG/ACT nasal spray Place 1 spray into both nostrils daily.   1  . hydrochlorothiazide (HYDRODIURIL) 12.5 MG tablet TAKE 1 TABLET(12.5 MG) BY MOUTH DAILY 60 tablet 3  . hydrOXYzine (ATARAX/VISTARIL) 10 MG tablet Take 1-3 tablets (10-30 mg total) by mouth 3 (three) times daily as needed. 60 tablet 1  . metoprolol tartrate (LOPRESSOR) 25 MG tablet Take 1 tablet (25 mg total) by mouth 2 (two) times daily. 180 tablet 3  . Multiple Vitamin (MULTIVITAMIN) tablet Take 1 tablet by mouth daily.    Marland Kitchen oxyCODONE-acetaminophen (PERCOCET/ROXICET) 5-325 MG tablet take 1-2 tablets po every 6 hours as needed for breakthrough post operative pain 28 tablet 0  . valACYclovir (VALTREX) 1000 MG tablet Take 1,000 mg by mouth daily as needed (fever blisters).     . Venlafaxine HCl 225 MG TB24 Take 225 mg by mouth daily with breakfast.   1  . zolpidem (AMBIEN) 10 MG tablet Take 10 mg by mouth at bedtime as needed  for sleep.   0   No current facility-administered medications for this visit.    Allergies:   Patient has no known allergies.    Social History:  The patient  reports that she has quit smoking. She has never used smokeless tobacco. She reports current alcohol use. She reports that she does not use drugs.   Family History:  The patient's family history includes AAA (abdominal aortic aneurysm) in her father; Atrial fibrillation in her mother; CAD in her brother; COPD in her brother, brother, father, and mother; Diabetes in her brother, maternal grandfather, maternal grandmother, and paternal grandmother; Heart disease in her father, maternal grandfather, maternal grandmother, and paternal grandfather; Heart failure in her father and mother; Kidney disease in her mother; Kidney failure in her mother; Peripheral Artery Disease in her brother; Stroke in  her brother and paternal grandmother; Testicular cancer (age of onset: 62) in her son.    ROS:  Please see the history of present illness.   Otherwise, review of systems are positive for none.   All other systems are reviewed and negative.    PHYSICAL EXAM: BP 138/70   Ht 5\' 6"  (1.676 m)   Wt (!) 318 lb (144.2 kg)   BMI 51.33 kg/m  GENERAL: Well-appearing.  No acute distress. HEENT: Pupils equal round.  Oral mucosa unremarkable NECK:  No jugular venous distention, no visible thyromegaly EXT:  No edema, no cyanosis no clubbing SKIN:  No rashes no nodules NEURO:  Speech fluent.  Cranial nerves grossly intact.  Moves all 4 extremities freely PSYCH:  Cognitively intact, oriented to person place and time  EKG:  EKG is not ordered today. The ekg ordered 03/31/16 demonstrates sinus rhythm rate 75 bpm.  05/04/17: Sinus rhythm.  Rate 76 bpm.  05/13/19: Sinus rhythm.  Rate 67 bpm.    11 Day Event Monitor 04/03/16:  Quality: Fair.  Baseline artifact. Predominant rhythm: sinus rhythm  PVCs, PACs and atrial runs were noted  Echo 05/2019: 1.  Left ventricular ejection fraction, by calculated 3D EF is 61%. The  left ventricle has normal function. There is mildly increased left  ventricular hypertrophy.  2. The left ventricle has no regional wall motion abnormalities.  3. Elevated left ventricular end-diastolic pressure.  4. Left ventricular diastolic parameters are consistent with Grade II  diastolic dysfunction (pseudonormalization).  5. Global right ventricle has normal systolic function.The right  ventricular size is normal. No increase in right ventricular wall  thickness.  6. Left atrial size was normal.  7. Right atrial size was normal.  8. The mitral valve is normal in structure. Trivial mitral valve  regurgitation. No evidence of mitral stenosis.  9. The tricuspid valve is normal in structure. Tricuspid valve  regurgitation is trivial.  10. Aortic valve mean gradient measures 8.0 mmHg.  11. The aortic valve was not well visualized. Aortic valve regurgitation  is trivial. Mild to moderate aortic valve sclerosis/calcification without  any evidence of aortic stenosis.  12. The pulmonic valve was normal in structure. Pulmonic valve  regurgitation is trivial.  13. TR signal is inadequate for assessing pulmonary artery systolic  pressure.  14. The inferior vena cava is dilated in size with >50% respiratory  variability, suggesting right atrial pressure of 8 mmHg.  15. The average left ventricular global longitudinal strain is -21.0 %.   Lexiscan Myoview 05/2019:  Nuclear stress EF: 60%.  The study is normal.  This is a low risk study.  There was no ST segment deviation noted during stress.  No T wave inversion was noted during stress.   Low risk stress nuclear study with normal perfusion and normal left ventricular regional and global systolic function.   Recent Labs: 06/24/2019: BUN 19; Creatinine, Ser 1.37; Hemoglobin 11.3; Platelets 277; Potassium 3.5; Sodium 139   02/25/16: WBC 10.1, hemoglobin  12.1, hematocrit 36.2, platelets 319 Sodium 143, potassium 4.5, BUN 24, creatinine 1.05 AST 27, ALT 24 TSH 0.886 D-dimer 0.68 Troponin I less than 0.01 Total cholesterol 262, triglycerides 131, HDL 75, LDL 161  05/2017: Sodium 142, potassium 3.6, BUN 14, creatinine 0.88 Total cholesterol 233, triglycerides 99, HDL 75, LDL 139  Lipid Panel    Component Value Date/Time   CHOL 192 05/14/2019 0831   TRIG 85 05/14/2019 0831   HDL 84 05/14/2019 0831   CHOLHDL 2.3 05/14/2019 0831  LDLCALC 93 05/14/2019 0831      Wt Readings from Last 3 Encounters:  06/10/20 (!) 318 lb (144.2 kg)  06/23/19 (!) 302 lb (137 kg)  06/19/19 (!) 339 lb (153.8 kg)      ASSESSMENT AND PLAN:  # Shortness of breath:  Resolved.  She does have grade 2 diastolic dysfunction.  She has been euvolemic and her breathing is improved.  Encouraged exercise.  # Palpitation: Improved on metoprolol.   # Hypertension:  Blood pressure is above goal today.  At home and at her multiple office visits she reports it has been well-controlled.  Continue hydrochlorothiazide and metoprolol  # Hyperlipidemia:  She reports fatigue and thinks that atorvastatin is the cause.  She will hold atorvastatin for 2 weeks and see if this helps.  If her fatigue does not improve she has been instructed to resume it.  If it does get better we will switch to an alternative agent.   Current medicines are reviewed at length with the patient today.  The patient does not have concerns regarding medicines.   Labs/ tests ordered today include:   No orders of the defined types were placed in this encounter.    Disposition:   FU with Kazuko Clemence C. Duke Salvia, MD, [facility] in 1 year   COVID-19 Education: The signs and symptoms of COVID-19 were discussed with the patient and how to seek care for testing (follow up with PCP or arrange E-visit). The importance of social distancing was discussed today.  Time:   Today, I have spent 22 minutes with the  patient with telehealth technology discussing the above problems.    Signed, Chynna Buerkle C. Duke Salvia, MD, [facility]  06/10/2020 10:30 AM    [facility]

## 2020-06-19 HISTORY — PX: OTHER SURGICAL HISTORY: SHX169

## 2020-07-14 ENCOUNTER — Other Ambulatory Visit: Payer: Self-pay | Admitting: Physician Assistant

## 2020-07-14 DIAGNOSIS — M859 Disorder of bone density and structure, unspecified: Secondary | ICD-10-CM | POA: Diagnosis not present

## 2020-07-14 DIAGNOSIS — M8580 Other specified disorders of bone density and structure, unspecified site: Secondary | ICD-10-CM | POA: Diagnosis not present

## 2020-07-23 DIAGNOSIS — U071 COVID-19: Secondary | ICD-10-CM | POA: Diagnosis not present

## 2020-08-24 ENCOUNTER — Other Ambulatory Visit: Payer: Self-pay | Admitting: Physician Assistant

## 2020-09-03 DIAGNOSIS — G4733 Obstructive sleep apnea (adult) (pediatric): Secondary | ICD-10-CM | POA: Diagnosis not present

## 2020-09-13 DIAGNOSIS — B001 Herpesviral vesicular dermatitis: Secondary | ICD-10-CM | POA: Diagnosis not present

## 2020-09-13 DIAGNOSIS — M170 Bilateral primary osteoarthritis of knee: Secondary | ICD-10-CM | POA: Diagnosis not present

## 2020-09-13 DIAGNOSIS — F331 Major depressive disorder, recurrent, moderate: Secondary | ICD-10-CM | POA: Diagnosis not present

## 2020-09-13 DIAGNOSIS — F988 Other specified behavioral and emotional disorders with onset usually occurring in childhood and adolescence: Secondary | ICD-10-CM | POA: Diagnosis not present

## 2020-09-30 ENCOUNTER — Ambulatory Visit (INDEPENDENT_AMBULATORY_CARE_PROVIDER_SITE_OTHER): Payer: BLUE CROSS/BLUE SHIELD | Admitting: Physician Assistant

## 2020-09-30 ENCOUNTER — Other Ambulatory Visit: Payer: Self-pay

## 2020-09-30 ENCOUNTER — Encounter: Payer: Self-pay | Admitting: Physician Assistant

## 2020-09-30 DIAGNOSIS — F5105 Insomnia due to other mental disorder: Secondary | ICD-10-CM

## 2020-09-30 DIAGNOSIS — F3341 Major depressive disorder, recurrent, in partial remission: Secondary | ICD-10-CM | POA: Diagnosis not present

## 2020-09-30 DIAGNOSIS — F99 Mental disorder, not otherwise specified: Secondary | ICD-10-CM | POA: Diagnosis not present

## 2020-09-30 DIAGNOSIS — F4010 Social phobia, unspecified: Secondary | ICD-10-CM

## 2020-09-30 MED ORDER — BUPROPION HCL ER (XL) 300 MG PO TB24: ORAL_TABLET | ORAL | 1 refills | Status: AC

## 2020-09-30 MED ORDER — HYDROXYZINE HCL 10 MG PO TABS: 10.0000 mg | ORAL_TABLET | Freq: Three times a day (TID) | ORAL | 1 refills | Status: DC | PRN

## 2020-09-30 NOTE — Progress Notes (Signed)
[facility] Check  Patient ID: Kimberly Castro,  MRN: [id number]  PCP: Sigmund Hazel, MD  Date of Evaluation: 09/30/2020 Time spent:20 minutes  Chief Complaint:  Chief Complaint    Depression; Anxiety      HISTORY/CURRENT STATUS: HPI For routine med check. Six months overdue.   Doing well with the Effexor and Wellbutrin. D/T physical issues, has not done anything fun.  Is currently trying to build up leave at work.  Patient denies loss of interest in usual activities and is able to enjoy things.  Denies decreased energy or motivation.  Appetite has not changed.  No extreme sadness, tearfulness, or feelings of hopelessness. Sleeps good most of the time.  Anxiety is well controlled. Denies suicidal or homicidal thoughts.  States that attention is good without easy distractibility.  Able to focus on things and finish tasks to completion.   Patient denies increased energy with decreased need for sleep, no increased talkativeness, no racing thoughts, no impulsivity or risky behaviors, no increased spending, no increased libido, no grandiosity, no increased irritability or anger, and no hallucinations.  Denies dizziness, syncope, seizures, numbness, tingling, tremor, tics, unsteady gait, slurred speech, confusion. Denies muscle or joint pain, stiffness, or dystonia.  Individual Medical History/ Review of Systems: Changes? :Yes  Had IC surgery, has helped a lot in Dec. Had spinal cord stimulator in Dec. Had COVID in January.  Has recovered very well.  Past medications for mental health diagnoses include: Paxil, Advil PM  Allergies: Patient has no known allergies.  Current Medications:  Current Outpatient Medications:  .  amphetamine-dextroamphetamine (ADDERALL XR) 30 MG 24 hr capsule, Take 30 mg by mouth daily. , Disp: , Rfl:  .  atorvastatin (LIPITOR) 80 MG tablet, TAKE 1 TABLET(80 MG) BY MOUTH DAILY, Disp: 90 tablet, Rfl: 1 .  cetirizine (ZYRTEC) 10 MG tablet, Take 10  mg by mouth daily., Disp: , Rfl:  .  Cholecalciferol (VITAMIN D) 50 MCG (2000 UT) CAPS, Take 2,000 Units by mouth daily., Disp: , Rfl:  .  Eluxadoline (VIBERZI) 75 MG TABS, Take 75 mg by mouth as needed., Disp: , Rfl:  .  estradiol (ESTRACE) 0.1 MG/GM vaginal cream, Place 1 Applicatorful vaginally at bedtime., Disp: , Rfl:  .  fluticasone (FLONASE) 50 MCG/ACT nasal spray, Place 1 spray into both nostrils daily. , Disp: , Rfl: 1 .  hydrochlorothiazide (HYDRODIURIL) 12.5 MG tablet, TAKE 1 TABLET(12.5 MG) BY MOUTH DAILY, Disp: 60 tablet, Rfl: 3 .  metoprolol tartrate (LOPRESSOR) 25 MG tablet, Take 1 tablet (25 mg total) by mouth 2 (two) times daily., Disp: 180 tablet, Rfl: 3 .  Multiple Vitamin (MULTIVITAMIN) tablet, Take 1 tablet by mouth daily., Disp: , Rfl:  .  valACYclovir (VALTREX) 1000 MG tablet, Take 1,000 mg by mouth daily as needed (fever blisters). , Disp: , Rfl:  .  Venlafaxine HCl 225 MG TB24, Take 225 mg by mouth daily with breakfast. , Disp: , Rfl: 1 .  zolpidem (AMBIEN) 10 MG tablet, Take 10 mg by mouth at bedtime as needed for sleep. , Disp: , Rfl: 0 .  buPROPion (WELLBUTRIN XL) 300 MG 24 hr tablet, TAKE 1 TABLET(300 MG) BY MOUTH DAILY, Disp: 90 tablet, Rfl: 1 .  hydrOXYzine (ATARAX/VISTARIL) 10 MG tablet, Take 1-3 tablets (10-30 mg total) by mouth 3 (three) times daily as needed., Disp: 60 tablet, Rfl: 1 .  oxyCODONE-acetaminophen (PERCOCET/ROXICET) 5-325 MG tablet, take 1-2 tablets po every 6 hours as needed for breakthrough post operative pain (Patient not taking:  Reported on 09/30/2020), Disp: 28 tablet, Rfl: 0 Medication Side Effects: none  Family Medical/ Social History: Changes? No  MENTAL HEALTH EXAM:  There were no vitals taken for this visit.There is no height or weight on file to calculate BMI.  General Appearance: Casual and Fairly Groomed  Eye Contact:  Good  Speech:  Clear and Coherent and Normal Rate  Volume:  Normal  Mood:  Euthymic  Affect:  Congruent  Thought  Process:  Goal Directed and Descriptions of Associations: Circumstantial  Orientation:  Full (Time, Place, and Person)  Thought Content: Logical   Suicidal Thoughts:  No  Homicidal Thoughts:  No  Memory:  WNL  Judgement:  Good  Insight:  Good  Psychomotor Activity:  Normal  Concentration:  Concentration: Good and Attention Span: Good  Recall:  Good  Fund of Knowledge: Good  Language: Good  Assets:  Desire for Improvement  ADL's:  Intact  Cognition: WNL  Prognosis:  Good    DIAGNOSES:    ICD-10-CM   1. Social anxiety disorder  F40.10   2. Insomnia due to other mental disorder  F51.05    F99   3. Recurrent major depressive disorder, in partial remission (HCC)  F33.41     Receiving Psychotherapy: No    RECOMMENDATIONS:  PDMP was reviewed. I provided 20 minutes of face-to-face time during this encounter, including time spent before and after the visit and records review and charting. I am glad she is doing well from a mental health standpoint.  No changes in meds are necessary. Continue Adderall XR 30 mg 1 p.o. daily. Continue Wellbutrin XL 300 mg 1 p.o. every morning. Continue hydroxyzine 10 mg, 1-3 p.o. 3 times daily. Continue Effexor XR 225 mg daily. Continue Ambien 10 mg, 1 p.o. nightly as needed. Return in 6 months.  Melony Overly, PA-C

## 2020-11-04 DIAGNOSIS — M170 Bilateral primary osteoarthritis of knee: Secondary | ICD-10-CM | POA: Diagnosis not present

## 2020-12-01 ENCOUNTER — Ambulatory Visit (INDEPENDENT_AMBULATORY_CARE_PROVIDER_SITE_OTHER): Payer: BLUE CROSS/BLUE SHIELD | Admitting: Bariatrics

## 2020-12-01 ENCOUNTER — Encounter (INDEPENDENT_AMBULATORY_CARE_PROVIDER_SITE_OTHER): Payer: Self-pay

## 2020-12-15 ENCOUNTER — Ambulatory Visit (INDEPENDENT_AMBULATORY_CARE_PROVIDER_SITE_OTHER): Payer: BLUE CROSS/BLUE SHIELD | Admitting: Bariatrics

## 2020-12-16 DIAGNOSIS — G4733 Obstructive sleep apnea (adult) (pediatric): Secondary | ICD-10-CM | POA: Diagnosis not present

## 2020-12-30 ENCOUNTER — Ambulatory Visit (INDEPENDENT_AMBULATORY_CARE_PROVIDER_SITE_OTHER): Payer: BLUE CROSS/BLUE SHIELD | Admitting: Bariatrics

## 2020-12-30 ENCOUNTER — Encounter (INDEPENDENT_AMBULATORY_CARE_PROVIDER_SITE_OTHER): Payer: Self-pay | Admitting: Bariatrics

## 2020-12-30 ENCOUNTER — Other Ambulatory Visit: Payer: Self-pay

## 2020-12-30 VITALS — BP 135/82 | HR 56 | Temp 98.1°F | Ht 66.0 in | Wt 344.0 lb

## 2020-12-30 DIAGNOSIS — I10 Essential (primary) hypertension: Secondary | ICD-10-CM

## 2020-12-30 DIAGNOSIS — E7800 Pure hypercholesterolemia, unspecified: Secondary | ICD-10-CM

## 2020-12-30 DIAGNOSIS — R0602 Shortness of breath: Secondary | ICD-10-CM

## 2020-12-30 DIAGNOSIS — Z6841 Body Mass Index (BMI) 40.0 and over, adult: Secondary | ICD-10-CM

## 2020-12-30 DIAGNOSIS — Z9884 Bariatric surgery status: Secondary | ICD-10-CM

## 2020-12-30 DIAGNOSIS — E559 Vitamin D deficiency, unspecified: Secondary | ICD-10-CM

## 2020-12-30 DIAGNOSIS — M797 Fibromyalgia: Secondary | ICD-10-CM

## 2020-12-30 DIAGNOSIS — Z1331 Encounter for screening for depression: Secondary | ICD-10-CM | POA: Diagnosis not present

## 2020-12-30 DIAGNOSIS — G4733 Obstructive sleep apnea (adult) (pediatric): Secondary | ICD-10-CM

## 2020-12-30 DIAGNOSIS — E538 Deficiency of other specified B group vitamins: Secondary | ICD-10-CM

## 2020-12-30 DIAGNOSIS — F5089 Other specified eating disorder: Secondary | ICD-10-CM

## 2020-12-30 DIAGNOSIS — R7309 Other abnormal glucose: Secondary | ICD-10-CM

## 2020-12-30 DIAGNOSIS — Z0289 Encounter for other administrative examinations: Secondary | ICD-10-CM

## 2020-12-30 DIAGNOSIS — R5383 Other fatigue: Secondary | ICD-10-CM | POA: Diagnosis not present

## 2020-12-30 DIAGNOSIS — Z9189 Other specified personal risk factors, not elsewhere classified: Secondary | ICD-10-CM
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
## 2021-01-05 LAB — COMPREHENSIVE METABOLIC PANEL
ALT: 35 IU/L — ABNORMAL HIGH (ref 0–32)
AST: 38 IU/L (ref 0–40)
Albumin/Globulin Ratio: 1.3 (ref 1.2–2.2)
Albumin: 3.9 g/dL (ref 3.8–4.9)
Alkaline Phosphatase: 175 IU/L — ABNORMAL HIGH (ref 44–121)
BUN/Creatinine Ratio: 19 (ref 9–23)
BUN: 18 mg/dL (ref 6–24)
Bilirubin Total: 0.3 mg/dL (ref 0.0–1.2)
CO2: 28 mmol/L (ref 20–29)
Calcium: 9.2 mg/dL (ref 8.7–10.2)
Chloride: 102 mmol/L (ref 96–106)
Creatinine, Ser: 0.96 mg/dL (ref 0.57–1.00)
Globulin, Total: 2.9 g/dL (ref 1.5–4.5)
Glucose: 80 mg/dL (ref 65–99)
Potassium: 4.3 mmol/L (ref 3.5–5.2)
Sodium: 145 mmol/L — ABNORMAL HIGH (ref 134–144)
Total Protein: 6.8 g/dL (ref 6.0–8.5)
eGFR: 68 mL/min/{1.73_m2} (ref >59)

## 2021-01-05 LAB — CBC WITH DIFFERENTIAL/PLATELET
Basophils Absolute: 0.1 10*3/uL (ref 0.0–0.2)
Basos: 1 %
EOS (ABSOLUTE): 0.2 10*3/uL (ref 0.0–0.4)
Eos: 2 %
Hematocrit: 38.4 % (ref 34.0–46.6)
Hemoglobin: 12.6 g/dL (ref 11.1–15.9)
Immature Grans (Abs): 0.1 10*3/uL (ref 0.0–0.1)
Immature Granulocytes: 1 %
Lymphocytes Absolute: 2.6 10*3/uL (ref 0.7–3.1)
Lymphs: 30 %
MCH: 28.2 pg (ref 26.6–33.0)
MCHC: 32.8 g/dL (ref 31.5–35.7)
MCV: 86 fL (ref 79–97)
Monocytes Absolute: 0.6 10*3/uL (ref 0.1–0.9)
Monocytes: 7 %
Neutrophils Absolute: 5.2 10*3/uL (ref 1.4–7.0)
Neutrophils: 59 %
Platelets: 302 10*3/uL (ref 150–450)
RBC: 4.47 x10E6/uL (ref 3.77–5.28)
RDW: 14.4 % (ref 11.7–15.4)
WBC: 8.8 10*3/uL (ref 3.4–10.8)

## 2021-01-05 LAB — LIPID PANEL WITH LDL/HDL RATIO
Cholesterol, Total: 254 mg/dL — ABNORMAL HIGH (ref 100–199)
HDL: 87 mg/dL (ref >39)
LDL Chol Calc (NIH): 147 mg/dL — ABNORMAL HIGH (ref 0–99)
LDL/HDL Ratio: 1.7 ratio (ref 0.0–3.2)
Triglycerides: 118 mg/dL (ref 0–149)
VLDL Cholesterol Cal: 20 mg/dL (ref 5–40)

## 2021-01-05 LAB — VITAMIN B1: Thiamine: 167.7 nmol/L (ref 66.5–200.0)

## 2021-01-05 LAB — INSULIN, RANDOM: INSULIN: 13.9 u[IU]/mL (ref 2.6–24.9)

## 2021-01-05 LAB — T3: T3, Total: 109 ng/dL (ref 71–180)

## 2021-01-05 LAB — T4, FREE: Free T4: 1.2 ng/dL (ref 0.82–1.77)

## 2021-01-05 LAB — VITAMIN D 25 HYDROXY (VIT D DEFICIENCY, FRACTURES): Vit D, 25-Hydroxy: 31.3 ng/mL (ref 30.0–100.0)

## 2021-01-05 LAB — TSH: TSH: 1.02 u[IU]/mL (ref 0.450–4.500)

## 2021-01-05 LAB — VITAMIN B12: Vitamin B-12: 332 pg/mL (ref 232–1245)

## 2021-01-05 LAB — HEMOGLOBIN A1C
Est. average glucose Bld gHb Est-mCnc: 105 mg/dL
Hgb A1c MFr Bld: 5.3 % (ref 4.8–5.6)

## 2021-01-10 NOTE — Progress Notes (Signed)
Chief Complaint:   OBESITY Kimberly Castro (MR# [id number]) is a 60 y.o. female who presents for evaluation and treatment of obesity and related comorbidities. Current BMI is Body mass index is 55.52 kg/m. Kimberly Castro has been struggling with her weight for many years and has been unsuccessful in either losing weight, maintaining weight loss, or reaching her healthy weight goal.  Kimberly Castro does not like to cook. She states cooking for two is not easy. She craves sweets, and she is a picky eater.  Kimberly Castro is currently in the action stage of change and ready to dedicate time achieving and maintaining a healthier weight. Kimberly Castro is interested in becoming our patient and working on intensive lifestyle modifications including (but not limited to) diet and exercise for weight loss.  Kimberly Castro habits were reviewed today and are as follows: Her family eats meals together, she thinks her family will eat healthier with her, her desired weight loss is 154 lbs, she has been heavy most of her life, she started gaining weight at 60 years old, her heaviest weight ever was 450 pounds, she is a picky eater and doesn't like to eat healthier foods, she has significant food cravings issues, she snacks frequently in the evenings, she skips meals frequently, she frequently makes poor food choices, she has problems with excessive hunger, she frequently eats larger portions than normal, and she struggles with emotional eating.  Depression Screen Kimberly Castro's Food and Mood (modified PHQ-9) score was 21.  Depression screen PHQ 2/9 12/30/2020  Decreased Interest 3  Down, Depressed, Hopeless 3  PHQ - 2 Score 6  Altered sleeping 3  Tired, decreased energy 3  Change in appetite 2  Feeling bad or failure about yourself  2  Trouble concentrating 1  Moving slowly or fidgety/restless 3  Suicidal thoughts 1  PHQ-9 Score 21  Difficult doing work/chores Somewhat difficult  Some encounter information is confidential and restricted. Go to  Review Flowsheets activity to see all data.   Subjective:   1. Other fatigue [name] admits to daytime somnolence and admits to waking up still tired. Patent has a history of symptoms of daytime fatigue and morning headache. Kimberly Castro generally gets 8 hours of sleep per night, and states that she has generally restful sleep. Snoring is present. Apneic episodes are not present. Epworth Sleepiness Score is 4.  2. SOB (shortness of breath) on exertion [name] notes increasing shortness of breath with exercising and seems to be worsening over time with weight gain. She notes getting out of breath sooner with activity than she used to. This has not gotten worse recently. [name] denies shortness of breath at rest or orthopnea.  3. Essential hypertension Kimberly Castro's blood pressure is controlled.   4. OSA (obstructive sleep apnea) Kimberly Castro is wearing her CPAP as directed.  5. Hypercholesteremia Kimberly Castro is not on medications. She did take a statin but she had increased muscle pain.  6. Fibromyalgia Kimberly Castro notes chronic pain.  7. Vitamin B 12 deficiency Kimberly Castro had been on B12 injections.  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 Her lowest weight is 219 lbs, and highest weight was 444 lbs. She has some restrictions.  9. Vitamin D deficiency Kimberly Castro is not on Vit D.  10. Elevated glucose Kimberly Castro has a history of elevated glucose readings.  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  12. At risk for activity intolerance Kimberly Castro is at risk for exercise intolerance due to obesity, weather,  sleep apnea, osteoarthritis, and chronic fatigue.  Assessment/Plan:   1. Other fatigue [name] does feel that her weight is causing her energy to be lower than it should be. Fatigue may be related to obesity, depression or many other causes. Labs will be ordered, and in the meanwhile, [name] will focus on self care including making healthy food choices, increasing physical activity  and focusing on stress reduction.  - EKG 12-Lead - Vitamin B12 - CBC with Differential/Platelet - Comprehensive metabolic panel - Insulin, random - Lipid Panel With LDL/HDL Ratio - T3 - T4, free - TSH - VITAMIN D 25 Hydroxy (Vit-D Deficiency, Fractures)  2. SOB (shortness of breath) on exertion [name] does feel that she gets out of breath more easily that she used to when she exercises. Kimberly Castro shortness of breath appears to be obesity related and exercise induced. She has agreed to work on weight loss and gradually increase exercise to treat her exercise induced shortness of breath. Will continue to monitor closely.  - Lipid Panel With LDL/HDL Ratio  3. Essential hypertension Kimberly Castro will continue her medications, and will continue working on healthy weight loss and exercise to improve blood pressure control. We will watch for signs of hypotension as she continues her lifestyle modifications.  4. OSA (obstructive sleep apnea) Intensive lifestyle modifications are the first line treatment for this issue. We discussed several lifestyle modifications today. Kimberly Castro will continue to wear her CPAP, and will continue to work on diet, exercise and weight loss efforts. We will continue to monitor. Orders and follow up as documented in patient record.   5. Hypercholesteremia Cardiovascular risk and specific lipid/LDL goals reviewed. We discussed several lifestyle modifications today. We will check labs today. Kimberly Castro will continue to work on diet, exercise and weight loss efforts. Orders and follow up as documented in patient record.   Counseling Intensive lifestyle modifications are the first line treatment for this issue. Dietary changes: Increase soluble fiber. Decrease simple carbohydrates. Exercise changes: Moderate to vigorous-intensity aerobic activity 150 minutes per week if tolerated. Lipid-lowering medications: see documented in medical record.  6. Fibromyalgia Intensive lifestyle  modifications are the first line treatment for this issue. We discussed several lifestyle modifications today. Kimberly Castro is to gradually increase her activity, and will continue to work on diet, exercise and weight loss efforts.We will continue to monitor. Orders and follow up as documented in patient record.   Counseling Try [email or web address].it, which is a series of self-care modules designed to teach patients several techniques to manage pain.   7. Vitamin B 12 deficiency The diagnosis was reviewed with the patient. Counseling provided today, see below. We will check labs today, and will continue to monitor. Kimberly Castro will continue to follow up as directed. Orders and follow up as documented in patient record.  Counseling The body needs vitamin B12: to make red blood cells; to make DNA; and to help the nerves work properly so they can carry messages from the brain to the body.  The main causes of vitamin B12 deficiency include dietary deficiency, digestive diseases, pernicious anemia, and having a surgery in which part of the stomach or small intestine is removed.  Certain medicines can make it harder for the body to absorb vitamin B12. These medicines include: heartburn medications; some antibiotics; some medications used to treat diabetes, gout, and high cholesterol.  In some cases, there are no symptoms of this condition. If the condition leads to anemia or nerve damage, various symptoms can occur, such as weakness or fatigue, shortness  of breath, and numbness or tingling in your hands and feet.   Treatment:  May include taking vitamin B12 supplements.  Avoid alcohol.  Eat lots of healthy foods that contain vitamin B12: Beef, pork, chicken, [place], and organ meats, such as liver.  Seafood: This includes clams, rainbow trout, salmon, tuna, and haddock. Eggs.  Cereal and dairy products that are fortified: This means that vitamin B12 has been added to the food.   - Vitamin B12  8. S/P  gastric bypass We will check labs today. Sakari will continue to follow up as directed.  - CBC with Differential/Platelet - Comprehensive metabolic panel - Vitamin B1  9. Vitamin D deficiency Low Vitamin D level contributes to fatigue and are associated with obesity, breast, and colon cancer. We will check labs today. Kimberly Castro will follow-up for routine testing of Vitamin D, at least 2-3 times per year to avoid over-replacement.  - VITAMIN D 25 Hydroxy (Vit-D Deficiency, Fractures)  10. Elevated glucose We will check labs today. Kimberly Castro will continue to follow up as directed.  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 We will schedule Lawson Fiscal with Dr. Dewaine Conger, our Bariatric Psychologist for evaluation. Orders and follow up as documented in patient record.   12. Depression screen Ainara had a positive depression screening. Depression is commonly associated with obesity and often results in emotional eating behaviors. We will monitor this closely and work on CBT to help improve the non-hunger eating patterns. Referral to Psychology may be required if no improvement is seen as she continues in our clinic.  13. At risk for activity intolerance Lizbet was given approximately 15 minutes of exercise intolerance counseling today. She is 60 y.o. female and has risk factors exercise intolerance including obesity. We discussed intensive lifestyle modifications today with an emphasis on specific weight loss instructions and strategies. Tikesha will slowly increase activity as tolerated.  Repetitive spaced learning was employed today to elicit superior memory formation and behavioral change.  14. Class 3 severe obesity with serious comorbidity and body mass index (BMI) of 50.0 to 59.9 in adult, unspecified obesity type (HCC) Berlin is currently in the action stage of change and  her goal is to continue with weight loss efforts. I recommend Allianna begin the structured treatment plan as follows:  She has agreed to the Category 3 Plan.  Intentional eating was discussed.  Exercise goals: No exercise has been prescribed at this time.   Behavioral modification strategies: increasing lean protein intake, decreasing simple carbohydrates, increasing vegetables, increasing water intake, decreasing eating out, no skipping meals, meal planning and cooking strategies, keeping healthy foods in the home, and planning for success.  She was informed of the importance of frequent follow-up visits to maximize her success with intensive lifestyle modifications for her multiple health conditions. She was informed we would discuss her lab results at her next visit unless there is a critical issue that needs to be addressed sooner. Naesha agreed to keep her next visit at the agreed upon time to discuss these results.  Objective:   Blood pressure 135/82, pulse (!) 56, temperature 98.1 F (36.7 C), height 5\' 6"  (1.676 m), weight (!) 344 lb (156 kg), SpO2 93 %. Body mass index is 55.52 kg/m.  EKG: Normal sinus rhythm, rate 54 BPM.  Indirect Calorimeter completed today shows a VO2 of 332 and a REE of 2312.  Her calculated basal  metabolic rate is [id number] thus her basal metabolic rate is better than expected.  General: Cooperative, alert, well developed, in no acute distress. HEENT: Conjunctivae and lids unremarkable. Cardiovascular: Regular rhythm.  Lungs: Normal work of breathing. Neurologic: No focal deficits.   Lab Results  Component Value Date   CREATININE 0.96 12/30/2020   BUN 18 12/30/2020   NA 145 (H) 12/30/2020   K 4.3 12/30/2020   CL 102 12/30/2020   CO2 28 12/30/2020   Lab Results  Component Value Date   ALT 35 (H) 12/30/2020   AST 38 12/30/2020   ALKPHOS 175 (H) 12/30/2020   BILITOT 0.3 12/30/2020   Lab Results  Component Value Date   HGBA1C 5.3 12/30/2020   Lab  Results  Component Value Date   INSULIN 13.9 12/30/2020   Lab Results  Component Value Date   TSH 1.020 12/30/2020   Lab Results  Component Value Date   CHOL 254 (H) 12/30/2020   HDL 87 12/30/2020   LDLCALC 147 (H) 12/30/2020   TRIG 118 12/30/2020   CHOLHDL 2.3 05/14/2019   Lab Results  Component Value Date   WBC 8.8 12/30/2020   HGB 12.6 12/30/2020   HCT 38.4 12/30/2020   MCV 86 12/30/2020   PLT 302 12/30/2020   No results found for: IRON, TIBC, FERRITIN  Attestation Statements:   Reviewed by clinician on day of visit: allergies, medications, problem list, medical history, surgical history, family history, social history, and previous encounter notes.   Trude McburneyI, Sharon Martin, am acting as Energy managertranscriptionist for [employer]el Dawnelle Warman, DO.  I have reviewed the above documentation for accuracy and completeness, and I agree with the above. Corinna Capra- Rexine Gowens, DO

## 2021-01-11 ENCOUNTER — Encounter (INDEPENDENT_AMBULATORY_CARE_PROVIDER_SITE_OTHER): Payer: Self-pay | Admitting: Bariatrics

## 2021-01-13 ENCOUNTER — Ambulatory Visit (INDEPENDENT_AMBULATORY_CARE_PROVIDER_SITE_OTHER): Payer: BLUE CROSS/BLUE SHIELD | Admitting: Bariatrics

## 2021-01-13 ENCOUNTER — Encounter (INDEPENDENT_AMBULATORY_CARE_PROVIDER_SITE_OTHER): Payer: Self-pay | Admitting: Bariatrics

## 2021-01-13 ENCOUNTER — Other Ambulatory Visit: Payer: Self-pay

## 2021-01-13 VITALS — BP 133/83 | HR 59 | Temp 98.1°F | Ht 66.0 in | Wt 342.0 lb

## 2021-01-13 DIAGNOSIS — I10 Essential (primary) hypertension: Secondary | ICD-10-CM

## 2021-01-13 DIAGNOSIS — E88819 Insulin resistance, unspecified: Secondary | ICD-10-CM

## 2021-01-13 DIAGNOSIS — E8881 Metabolic syndrome: Secondary | ICD-10-CM | POA: Diagnosis not present

## 2021-01-13 DIAGNOSIS — Z6841 Body Mass Index (BMI) 40.0 and over, adult: Secondary | ICD-10-CM

## 2021-01-13 DIAGNOSIS — Z9189 Other specified personal risk factors, not elsewhere classified: Secondary | ICD-10-CM

## 2021-01-13 DIAGNOSIS — R632 Polyphagia: Secondary | ICD-10-CM

## 2021-01-13 MED ORDER — TIRZEPATIDE 2.5 MG/0.5ML ~~LOC~~ SOAJ: 2.5000 mg | SUBCUTANEOUS | 0 refills | Status: DC

## 2021-01-18 ENCOUNTER — Telehealth (INDEPENDENT_AMBULATORY_CARE_PROVIDER_SITE_OTHER): Payer: BLUE CROSS/BLUE SHIELD | Admitting: Psychology

## 2021-01-18 ENCOUNTER — Encounter (INDEPENDENT_AMBULATORY_CARE_PROVIDER_SITE_OTHER): Payer: Self-pay

## 2021-01-18 ENCOUNTER — Encounter (INDEPENDENT_AMBULATORY_CARE_PROVIDER_SITE_OTHER): Payer: Self-pay | Admitting: Bariatrics

## 2021-01-18 NOTE — Progress Notes (Signed)
Chief Complaint:   OBESITY Kimberly Castro is here to discuss her progress with her obesity treatment plan along with follow-up of her obesity related diagnoses. Kimberly Castro is on the Category 3 Plan and states she is following her eating plan approximately 40% of the time. Kimberly Castro states she is doing water exercises for 30 minutes 3 times per week.  Today's visit was #: 2 Starting weight: 344 lbs Starting date: 12/30/2020 Today's weight: 342 lbs Today's date:01/13/2021 Total lbs lost to date: 2 lbs Total lbs lost since last in-office visit: 2 lbs  Interim History: Kimberly Castro is down 2 lbs from her initial visit. Since she has been on vacation which disturbed her routine somewhat.  Subjective:   1. Insulin resistance Kimberly Castro's A1C level was 5.3. Her Insulin level was 13.9/  2. Essential hypertension Kimberly Castro is taking HCTZ and Lopressor  Her hypertension is controled.  3. Kimberly Castro denies history of pancreatitis, or thyroid cancer in herself , or family , orhistory of MEN syndrome, or triglycerides over 500.   4. At risk of diabetes mellitus Kimberly Castro is at risk for diabetics due to Insulin resistance.  Assessment/Plan:   1. Insulin resistance Kimberly Castro will decrease carbohydrates. She will increase healthy fats and protein. She will continue to work on weight loss, exercise, and decreasing simple carbohydrates to help decrease the risk of diabetes. Kimberly Castro agreed to follow-up with [place] as directed to closely monitor her progress. She will start on Mounjaro.   - tirzepatide [facility]) 2.5 MG/0.5ML Pen; Inject 2.5 mg into the skin once a week.  Dispense: 2 mL; Refill: 0  2. Essential hypertension Kimberly Castro is working on healthy weight loss and exercise to improve blood pressure control. Kimberly Castro will continue her medications. We will watch for signs of hypotension as she continues her lifestyle modifications.   3. Kimberly Castro received information for a  saving card on [place], and will start on Monnjaro.   4. At  risk of diabetes mellitus Kimberly Castro was given approximately 15 minutes of diabetes education and counseling today. We discussed intensive lifestyle modifications today with an emphasis on weight loss as well as increasing exercise and decreasing simple carbohydrates in her diet. We also reviewed medication options with an emphasis on risk versus benefit of those discussed.   Repetitive spaced learning was employed today to elicit superior memory formation and behavioral change.   5. Obesity, current BMI 55.3 Kimberly Castro is currently in the action stage of change. As such, her goal is to continue with weight loss efforts. She has agreed to the Category 3 Plan.   Kimberly Castro will continue meal planning. She will continue intentional eating. I will review labs with Kimberly Castro.  Exercise goals:  Swimming in the pool.  Behavioral modification strategies: increasing lean protein intake, decreasing simple carbohydrates, increasing vegetables, increasing water intake, and planning for success.  Kimberly Castro has agreed to follow-up with our clinic in 2 weeks. She was informed of the importance of frequent follow-up visits to maximize her success with intensive lifestyle modifications for her multiple health conditions.   Objective:   Blood pressure 133/83, pulse (!) 59, temperature 98.1 F (36.7 C), height 5\' 6"  (1.676 m), weight (!) 342 lb (155.1 kg), SpO2 99 %. Body mass index is 55.2 kg/m.  General: Cooperative, alert, well developed, in no acute distress. HEENT: Conjunctivae and lids unremarkable. Cardiovascular: Regular rhythm.  Lungs: Normal work of breathing. Neurologic: No focal deficits.   Lab Results  Component Value Date   CREATININE 0.96 12/30/2020  BUN 18 12/30/2020   NA 145 (H) 12/30/2020   K 4.3 12/30/2020   CL 102 12/30/2020   CO2 28 12/30/2020   Lab Results  Component Value Date   ALT 35 (H) 12/30/2020   AST 38 12/30/2020   ALKPHOS 175 (H) 12/30/2020   BILITOT 0.3 12/30/2020   Lab Results   Component Value Date   HGBA1C 5.3 12/30/2020   Lab Results  Component Value Date   INSULIN 13.9 12/30/2020   Lab Results  Component Value Date   TSH 1.020 12/30/2020   Lab Results  Component Value Date   CHOL 254 (H) 12/30/2020   HDL 87 12/30/2020   LDLCALC 147 (H) 12/30/2020   TRIG 118 12/30/2020   CHOLHDL 2.3 05/14/2019   Lab Results  Component Value Date   VD25OH 31.3 12/30/2020   Lab Results  Component Value Date   WBC 8.8 12/30/2020   HGB 12.6 12/30/2020   HCT 38.4 12/30/2020   MCV 86 12/30/2020   PLT 302 12/30/2020   No results found for: IRON, TIBC, FERRITIN  Attestation Statements:   Reviewed by clinician on day of visit: allergies, medications, problem list, medical history, surgical history, family history, social history, and previous encounter notes.  I, Jackson Latino, RMA, am acting as Energy manager for [employer], DO.   I have reviewed the above documentation for accuracy and completeness, and I agree with the above. Corinna Capra, DO

## 2021-01-27 ENCOUNTER — Ambulatory Visit (INDEPENDENT_AMBULATORY_CARE_PROVIDER_SITE_OTHER): Payer: BLUE CROSS/BLUE SHIELD | Admitting: Bariatrics

## 2021-01-27 ENCOUNTER — Encounter (INDEPENDENT_AMBULATORY_CARE_PROVIDER_SITE_OTHER): Payer: Self-pay | Admitting: Bariatrics

## 2021-01-27 ENCOUNTER — Other Ambulatory Visit: Payer: Self-pay

## 2021-01-27 VITALS — BP 140/84 | HR 57 | Temp 97.9°F | Ht 66.0 in | Wt 341.0 lb

## 2021-01-27 DIAGNOSIS — I10 Essential (primary) hypertension: Secondary | ICD-10-CM | POA: Diagnosis not present

## 2021-01-27 DIAGNOSIS — Z6841 Body Mass Index (BMI) 40.0 and over, adult: Secondary | ICD-10-CM

## 2021-01-27 DIAGNOSIS — R632 Polyphagia: Secondary | ICD-10-CM

## 2021-01-28 DIAGNOSIS — N3941 Urge incontinence: Secondary | ICD-10-CM | POA: Diagnosis not present

## 2021-01-28 DIAGNOSIS — N3010 Interstitial cystitis (chronic) without hematuria: Secondary | ICD-10-CM | POA: Diagnosis not present

## 2021-01-31 NOTE — Progress Notes (Signed)
Chief Complaint:   OBESITY Kimberly Castro is here to discuss her progress with her obesity treatment plan along with follow-up of her obesity related diagnoses. Kimberly Castro is on the Category 3 Plan and states she is following her eating plan approximately 80% of the time. Kimberly Castro states she is not currently exercising.  Today's visit was #: 3 Starting weight: 344 lbs Starting date: 12/30/2020 Today's weight: 341 lbs Today's date: 01/27/2021 Total lbs lost to date: 3 lbs Total lbs lost since last in-office visit: 1 lb  Interim History: Kimberly Castro is down 1 lb since her last visit. She has started on Mounjaro since the last visit. She is avoiding sweets and cravings have decreased.  Subjective:   1. Essential hypertension Review: taking medications as instructed, no medication side effects noted, no chest pain on exertion, no dyspnea on exertion, no swelling of ankles. Kimberly Castro's blood pressure is reasonably controlled.  BP Readings from Last 3 Encounters:  01/27/21 140/84  01/13/21 133/83  12/30/20 135/82   2. Polyphagia Mounjaro was prescribed at last visit.  Assessment/Plan:   1. Essential hypertension Kimberly Castro is working on healthy weight loss and exercise to improve blood pressure control. We will watch for signs of hypotension as she continues her lifestyle modifications. Kimberly Castro will continue taking her medication.  2. Polyphagia Intensive lifestyle modifications are the first line treatment for this issue. We discussed several lifestyle modifications today and she will continue to work on diet, exercise and weight loss efforts. Orders and follow up as documented in patient record.  Counseling Polyphagia is excessive hunger. Causes can include: low blood sugars, hypERthyroidism, PMS, lack of sleep, stress, insulin resistance, diabetes, certain medications, and diets that are deficient in protein and fiber. Will increase Mounjaro at next visit.  3. Obesity, current BMI 55.1 Kimberly Castro is currently in the  action stage of change. As such, her goal is to continue with weight loss efforts. She has agreed to the Category 3 Plan.   Meal planning Will continue to follow the plan closely Go from Category 3 to Category 2 + 100 calories Recipes handout was provided  Exercise goals: As is.  Behavioral modification strategies: increasing lean protein intake, decreasing simple carbohydrates, increasing vegetables, increasing water intake, decreasing eating out, no skipping meals, meal planning and cooking strategies, keeping healthy foods in the home, and planning for success.  Kimberly Castro has agreed to follow-up with our clinic in 2 weeks. She was informed of the importance of frequent follow-up visits to maximize her success with intensive lifestyle modifications for her multiple health conditions.   Objective:   Blood pressure 140/84, pulse (!) 57, temperature 97.9 F (36.6 C), height 5\' 6"  (1.676 m), weight (!) 341 lb (154.7 kg), SpO2 96 %. Body mass index is 55.04 kg/m.  General: Cooperative, alert, well developed, in no acute distress. HEENT: Conjunctivae and lids unremarkable. Cardiovascular: Regular rhythm.  Lungs: Normal work of breathing. Neurologic: No focal deficits.   Lab Results  Component Value Date   CREATININE 0.96 12/30/2020   BUN 18 12/30/2020   NA 145 (H) 12/30/2020   K 4.3 12/30/2020   CL 102 12/30/2020   CO2 28 12/30/2020   Lab Results  Component Value Date   ALT 35 (H) 12/30/2020   AST 38 12/30/2020   ALKPHOS 175 (H) 12/30/2020   BILITOT 0.3 12/30/2020   Lab Results  Component Value Date   HGBA1C 5.3 12/30/2020   Lab Results  Component Value Date   INSULIN 13.9 12/30/2020  Lab Results  Component Value Date   TSH 1.020 12/30/2020   Lab Results  Component Value Date   CHOL 254 (H) 12/30/2020   HDL 87 12/30/2020   LDLCALC 147 (H) 12/30/2020   TRIG 118 12/30/2020   CHOLHDL 2.3 05/14/2019   Lab Results  Component Value Date   VD25OH 31.3 12/30/2020    Lab Results  Component Value Date   WBC 8.8 12/30/2020   HGB 12.6 12/30/2020   HCT 38.4 12/30/2020   MCV 86 12/30/2020   PLT 302 12/30/2020   No results found for: IRON, TIBC, FERRITIN  Attestation Statements:   Reviewed by clinician on day of visit: allergies, medications, problem list, medical history, surgical history, family history, social history, and previous encounter notes.  Time spent on visit including pre-visit chart review and post-visit care and charting was 20 minutes.   IPaulla Fore, CMA, am acting as transcriptionist for Dr. Corinna Capra, DO.   I have reviewed the above documentation for accuracy and completeness, and I agree with the above. Corinna Capra, DO

## 2021-02-01 ENCOUNTER — Encounter (INDEPENDENT_AMBULATORY_CARE_PROVIDER_SITE_OTHER): Payer: Self-pay | Admitting: Bariatrics

## 2021-02-11 ENCOUNTER — Other Ambulatory Visit (INDEPENDENT_AMBULATORY_CARE_PROVIDER_SITE_OTHER): Payer: Self-pay | Admitting: Bariatrics

## 2021-02-11 DIAGNOSIS — E8881 Metabolic syndrome: Secondary | ICD-10-CM

## 2021-02-16 ENCOUNTER — Ambulatory Visit (INDEPENDENT_AMBULATORY_CARE_PROVIDER_SITE_OTHER): Payer: BLUE CROSS/BLUE SHIELD | Admitting: Bariatrics

## 2021-02-16 ENCOUNTER — Encounter (INDEPENDENT_AMBULATORY_CARE_PROVIDER_SITE_OTHER): Payer: Self-pay | Admitting: Bariatrics

## 2021-02-16 ENCOUNTER — Other Ambulatory Visit: Payer: Self-pay

## 2021-02-16 VITALS — BP 130/63 | HR 54 | Temp 98.1°F | Ht 66.0 in | Wt 335.0 lb

## 2021-02-16 DIAGNOSIS — Z6841 Body Mass Index (BMI) 40.0 and over, adult: Secondary | ICD-10-CM

## 2021-02-16 DIAGNOSIS — R632 Polyphagia: Secondary | ICD-10-CM | POA: Diagnosis not present

## 2021-02-16 DIAGNOSIS — I10 Essential (primary) hypertension: Secondary | ICD-10-CM | POA: Diagnosis not present

## 2021-02-16 DIAGNOSIS — E8881 Metabolic syndrome: Secondary | ICD-10-CM

## 2021-02-16 DIAGNOSIS — Z9189 Other specified personal risk factors, not elsewhere classified: Secondary | ICD-10-CM

## 2021-02-16 MED ORDER — TIRZEPATIDE 5 MG/0.5ML ~~LOC~~ SOAJ: 5.0000 mg | SUBCUTANEOUS | 0 refills | Status: DC

## 2021-02-17 ENCOUNTER — Encounter (INDEPENDENT_AMBULATORY_CARE_PROVIDER_SITE_OTHER): Payer: Self-pay

## 2021-02-17 ENCOUNTER — Encounter (INDEPENDENT_AMBULATORY_CARE_PROVIDER_SITE_OTHER): Payer: Self-pay | Admitting: Bariatrics

## 2021-02-17 NOTE — Progress Notes (Signed)
Chief Complaint:   OBESITY Kimberly Castro is here to discuss her progress with her obesity treatment plan along with follow-up of her obesity related diagnoses. Kimberly Castro is on the Category 3 Plan and states she is following her eating plan approximately 85% of the time. Kimberly Castro states she is doing house work for 0 minutes 0 times per week.  Today's visit was #: 4 Starting weight: 344 lbs Starting date: 12/30/2020 Today's weight: 335 lbs Today's date: 02/16/2021 Total lbs lost to date: 9 lbs Total lbs lost since last in-office visit: 6 lbs  Interim History: Kimberly Castro is down 6 lbs since her last visit and doing well overall.  Subjective:   1. Insulin resistance Kimberly Castro is currently taking Mounjaro. She states she feels fuller sooner.  2. Essential hypertension Kimberly Castro's hypertension is controlled. Review: taking medications as instructed, no medication side effects noted, no chest pain on exertion, no dyspnea on exertion, no swelling of ankles.   3. Polyphagia Kimberly Castro is currently taking Mounjaro.  4. At risk of diabetes mellitus Kimberly Castro is at risk of diabetes mellitus due to Insulin resistance.  Assessment/Plan:   1. Insulin resistance Kimberly Castro will continue Mounjaro. We will refill Mounjaro for 1 month with no refills. She will continue to work on weight loss, exercise, and decreasing simple carbohydrates to help decrease the risk of diabetes. Kimberly Castro agreed to follow-up with [place] as directed to closely monitor her progress.  - tirzepatide [facility]) 5 MG/0.5ML Pen; Inject 5 mg into the skin once a week.  Dispense: 2 mL; Refill: 0  2. Essential hypertension Kimberly Castro will continue medications. She is working on healthy weight loss and exercise to improve blood pressure control. We will watch for signs of hypotension as she continues her lifestyle modifications.  3. Polyphagia Intensive lifestyle modifications are the first line treatment for this issue. We discussed several lifestyle modifications today and she will  continue to work on diet, exercise and weight loss efforts. Kimberly Castro will continue taking Mounjaro. We will refill Mounjaro 5 mg once a week for 1 month with no refills. Orders and follow up as documented in patient record.  Counseling Polyphagia is excessive hunger. Causes can include: low blood sugars, hypERthyroidism, PMS, lack of sleep, stress, insulin resistance, diabetes, certain medications, and diets that are deficient in protein and fiber.    4. At risk of diabetes mellitus Kimberly Castro was given approximately 15 minutes of diabetes education and counseling today. We discussed intensive lifestyle modifications today with an emphasis on weight loss as well as increasing exercise and decreasing simple carbohydrates in her diet. We also reviewed medication options with an emphasis on risk versus benefit of those discussed.   Repetitive spaced learning was employed today to elicit superior memory formation and behavioral change.   5. Obesity, current BMI 54.2 Kimberly Castro is currently in the action stage of change. As such, her goal is to continue with weight loss efforts. She has agreed to the Category 3 Plan.   Kimberly Castro will continue meal planning. She will continue to adhere closely to the plan (80-85%). Handout Fruit sheet was given today.  Exercise goals:  As is.  Behavioral modification strategies: increasing lean protein intake, decreasing simple carbohydrates, increasing vegetables, increasing water intake, decreasing eating out, no skipping meals, meal planning and cooking strategies, keeping healthy foods in the home, and planning for success.  Kimberly Castro has agreed to follow-up with our clinic in 2 weeks. She was informed of the importance of frequent follow-up visits to maximize her success with  intensive lifestyle modifications for her multiple health conditions.   Objective:   Blood pressure 130/63, pulse (!) 54, temperature 98.1 F (36.7 C), height 5\' 6"  (1.676 m), weight (!) 335 lb (152 kg), SpO2 99  %. Body mass index is 54.07 kg/m.  General: Cooperative, alert, well developed, in no acute distress. HEENT: Conjunctivae and lids unremarkable. Cardiovascular: Regular rhythm.  Lungs: Normal work of breathing. Neurologic: No focal deficits.   Lab Results  Component Value Date   CREATININE 0.96 12/30/2020   BUN 18 12/30/2020   NA 145 (H) 12/30/2020   K 4.3 12/30/2020   CL 102 12/30/2020   CO2 28 12/30/2020   Lab Results  Component Value Date   ALT 35 (H) 12/30/2020   AST 38 12/30/2020   ALKPHOS 175 (H) 12/30/2020   BILITOT 0.3 12/30/2020   Lab Results  Component Value Date   HGBA1C 5.3 12/30/2020   Lab Results  Component Value Date   INSULIN 13.9 12/30/2020   Lab Results  Component Value Date   TSH 1.020 12/30/2020   Lab Results  Component Value Date   CHOL 254 (H) 12/30/2020   HDL 87 12/30/2020   LDLCALC 147 (H) 12/30/2020   TRIG 118 12/30/2020   CHOLHDL 2.3 05/14/2019   Lab Results  Component Value Date   VD25OH 31.3 12/30/2020   Lab Results  Component Value Date   WBC 8.8 12/30/2020   HGB 12.6 12/30/2020   HCT 38.4 12/30/2020   MCV 86 12/30/2020   PLT 302 12/30/2020   No results found for: IRON, TIBC, FERRITIN  Attestation Statements:   Reviewed by clinician on day of visit: allergies, medications, problem list, medical history, surgical history, family history, social history, and previous encounter notes.  I, 01/01/2021, RMA, am acting as Jackson Latino for Energy manager, DO.   I have reviewed the above documentation for accuracy and completeness, and I agree with the above. [employer], DO

## 2021-03-15 ENCOUNTER — Ambulatory Visit (INDEPENDENT_AMBULATORY_CARE_PROVIDER_SITE_OTHER): Payer: BLUE CROSS/BLUE SHIELD | Admitting: Bariatrics

## 2021-03-15 ENCOUNTER — Encounter (INDEPENDENT_AMBULATORY_CARE_PROVIDER_SITE_OTHER): Payer: Self-pay | Admitting: Bariatrics

## 2021-03-15 ENCOUNTER — Other Ambulatory Visit: Payer: Self-pay

## 2021-03-15 VITALS — BP 139/79 | HR 60 | Temp 98.0°F | Ht 66.0 in | Wt 328.0 lb

## 2021-03-15 DIAGNOSIS — E7800 Pure hypercholesterolemia, unspecified: Secondary | ICD-10-CM | POA: Diagnosis not present

## 2021-03-15 DIAGNOSIS — Z6841 Body Mass Index (BMI) 40.0 and over, adult: Secondary | ICD-10-CM

## 2021-03-15 DIAGNOSIS — E8881 Metabolic syndrome: Secondary | ICD-10-CM

## 2021-03-15 DIAGNOSIS — Z9189 Other specified personal risk factors, not elsewhere classified: Secondary | ICD-10-CM

## 2021-03-15 MED ORDER — TIRZEPATIDE 7.5 MG/0.5ML ~~LOC~~ SOAJ: 7.5000 mg | SUBCUTANEOUS | 0 refills | Status: DC

## 2021-03-16 ENCOUNTER — Encounter (INDEPENDENT_AMBULATORY_CARE_PROVIDER_SITE_OTHER): Payer: Self-pay

## 2021-03-16 ENCOUNTER — Encounter (INDEPENDENT_AMBULATORY_CARE_PROVIDER_SITE_OTHER): Payer: Self-pay | Admitting: Bariatrics

## 2021-03-16 NOTE — Progress Notes (Signed)
Chief Complaint:   OBESITY Devera is here to discuss her progress with her obesity treatment plan along with follow-up of her obesity related diagnoses. Ayerim is on the Category 3 Plan and states she is following her eating plan approximately 95% of the time. [place] states she is doing 0 minutes 0 times per week.  Today's visit was #: 5 Starting weight: 344 lbs Starting date: 12/30/2020 Today's weight: 328 lbs Today's date: 03/15/2021 Total lbs lost to date: 16 lbs Total lbs lost since last in-office visit: 7 lbs  Interim History: Robynn is down an additional 7 lbs and doing well overall. She has been sticking to the plan. Her cravings are down.   Subjective:   1. Insulin resistance Cash is taking Mounjaro. She denies side effects. Her appetite is controlled except around 3:00.  2. Hypercholesteremia Susen is currently not taking medications.   3. At risk for heart disease Naysa is at risk for heart disease due to obesity and hypercholesteremia.   Assessment/Plan:   1. Insulin resistance Jamesia will continue to work on weight loss, exercise, and decreasing simple carbohydrates to help decrease the risk of diabetes. We will refill Mounjaro 7.5 mg for 1 month with no refills. Avarose agreed to follow-up with [place] as directed to closely monitor her progress.  - tirzepatide (MOUNJARO) 7.5 MG/0.5ML Pen; Inject 7.5 mg into the skin once a week.  Dispense: 2 mL; Refill: 0  2. Hypercholesteremia Cardiovascular risk and specific lipid/LDL goals reviewed.  We discussed several lifestyle modifications today and Kaylon will continue to work on diet, exercise and weight loss efforts. Gianella will continue to work on plan and exercise. Orders and follow up as documented in patient record.   Counseling Intensive lifestyle modifications are the first line treatment for this issue. Dietary changes: Increase soluble fiber. Decrease simple carbohydrates. Exercise changes: Moderate to vigorous-intensity aerobic  activity 150 minutes per week if tolerated. Lipid-lowering medications: see documented in medical record.   3. At risk for heart disease Nanci was given approximately 15 minutes of coronary artery disease prevention counseling today. She is 60 y.o. female and has risk factors for heart disease including obesity. We discussed intensive lifestyle modifications today with an emphasis on specific weight loss instructions and strategies.   Repetitive spaced learning was employed today to elicit superior memory formation and behavioral change.   4. Obesity, current BMI 53 Kerah is currently in the action stage of change. As such, her goal is to continue with weight loss efforts. She has agreed to the Category 3 Plan.   Tsering will continue meal planning. She will adhere closely to the plan.  Exercise goals: No exercise has been prescribed at this time.  Behavioral modification strategies: increasing lean protein intake, decreasing simple carbohydrates, increasing vegetables, increasing water intake, decreasing eating out, no skipping meals, meal planning and cooking strategies, keeping healthy foods in the home, and planning for success.  Lotus has agreed to follow-up with our clinic in 2 weeks. She was informed of the importance of frequent follow-up visits to maximize her success with intensive lifestyle modifications for her multiple health conditions.   Objective:   Blood pressure 139/79, pulse 60, temperature 98 F (36.7 C), height 5\' 6"  (1.676 m), weight (!) 328 lb (148.8 kg), SpO2 97 %. Body mass index is 52.94 kg/m.  General: Cooperative, alert, well developed, in no acute distress. HEENT: Conjunctivae and lids unremarkable. Cardiovascular: Regular rhythm.  Lungs: Normal work of breathing. Neurologic: No focal deficits.  Lab Results  Component Value Date   CREATININE 0.96 12/30/2020   BUN 18 12/30/2020   NA 145 (H) 12/30/2020   K 4.3 12/30/2020   CL 102 12/30/2020   CO2 28  12/30/2020   Lab Results  Component Value Date   ALT 35 (H) 12/30/2020   AST 38 12/30/2020   ALKPHOS 175 (H) 12/30/2020   BILITOT 0.3 12/30/2020   Lab Results  Component Value Date   HGBA1C 5.3 12/30/2020   Lab Results  Component Value Date   INSULIN 13.9 12/30/2020   Lab Results  Component Value Date   TSH 1.020 12/30/2020   Lab Results  Component Value Date   CHOL 254 (H) 12/30/2020   HDL 87 12/30/2020   LDLCALC 147 (H) 12/30/2020   TRIG 118 12/30/2020   CHOLHDL 2.3 05/14/2019   Lab Results  Component Value Date   VD25OH 31.3 12/30/2020   Lab Results  Component Value Date   WBC 8.8 12/30/2020   HGB 12.6 12/30/2020   HCT 38.4 12/30/2020   MCV 86 12/30/2020   PLT 302 12/30/2020   No results found for: IRON, TIBC, FERRITIN  Attestation Statements:   Reviewed by clinician on day of visit: allergies, medications, problem list, medical history, surgical history, family history, social history, and previous encounter notes.   I, Jackson Latino, RMA, am acting as Energy manager for [employer], DO.   I have reviewed the above documentation for accuracy and completeness, and I agree with the above. Corinna Capra, DO

## 2021-03-18 DIAGNOSIS — G4700 Insomnia, unspecified: Secondary | ICD-10-CM | POA: Diagnosis not present

## 2021-03-18 DIAGNOSIS — I10 Essential (primary) hypertension: Secondary | ICD-10-CM | POA: Diagnosis not present

## 2021-03-18 DIAGNOSIS — F331 Major depressive disorder, recurrent, moderate: Secondary | ICD-10-CM | POA: Diagnosis not present

## 2021-03-18 DIAGNOSIS — F419 Anxiety disorder, unspecified: Secondary | ICD-10-CM | POA: Diagnosis not present

## 2021-03-30 ENCOUNTER — Ambulatory Visit: Payer: BLUE CROSS/BLUE SHIELD | Admitting: Physician Assistant

## 2021-04-05 ENCOUNTER — Other Ambulatory Visit: Payer: Self-pay

## 2021-04-05 ENCOUNTER — Ambulatory Visit (INDEPENDENT_AMBULATORY_CARE_PROVIDER_SITE_OTHER): Payer: BLUE CROSS/BLUE SHIELD | Admitting: Bariatrics

## 2021-04-05 ENCOUNTER — Encounter (INDEPENDENT_AMBULATORY_CARE_PROVIDER_SITE_OTHER): Payer: Self-pay | Admitting: Bariatrics

## 2021-04-05 VITALS — BP 149/79 | HR 63 | Temp 97.9°F | Ht 66.0 in | Wt 320.0 lb

## 2021-04-05 DIAGNOSIS — E8881 Metabolic syndrome: Secondary | ICD-10-CM | POA: Diagnosis not present

## 2021-04-05 DIAGNOSIS — Z6841 Body Mass Index (BMI) 40.0 and over, adult: Secondary | ICD-10-CM | POA: Diagnosis not present

## 2021-04-05 DIAGNOSIS — R632 Polyphagia: Secondary | ICD-10-CM

## 2021-04-05 MED ORDER — TIRZEPATIDE 10 MG/0.5ML ~~LOC~~ SOAJ: 10.0000 mg | SUBCUTANEOUS | 0 refills | Status: DC

## 2021-04-06 ENCOUNTER — Encounter (INDEPENDENT_AMBULATORY_CARE_PROVIDER_SITE_OTHER): Payer: Self-pay

## 2021-04-06 NOTE — Progress Notes (Signed)
Chief Complaint:   OBESITY Kimberly Castro is here to discuss her progress with her obesity treatment plan along with follow-up of her obesity related diagnoses. Kimberly Castro is on the Category 3 Plan and states she is following her eating plan approximately 75% of the time. Kimberly Castro states she is doing 0 minutes 0 times per week.  Today's visit was #: 6 Starting weight: 344 lbs Starting date: 12/30/2020 Today's weight: 320 lbs Today's date: 04/05/2021 Total lbs lost to date: 24 lbs Total lbs lost since last in-office visit: 8 lbs  Interim History: Kimberly Castro is down 8 lbs since her last visit and has done well overall. She has been out to eat several times and has had some mild stress eating.  Subjective:   1. Insulin resistance Kimberly Castro is currently taking Mounjaro. She agreed to increase dose.   2. Polyphagia Kimberly Castro is taking Mounjaro currently.   Assessment/Plan:   1. Insulin resistance Kimberly Castro will continue taking Mounjaro. We will refill Mounjaro 10 mg with no refills. She will continue to work on weight loss, exercise, and decreasing simple carbohydrates to help decrease the risk of diabetes. Kimberly Castro agreed to follow-up with [place] as directed to closely monitor her progress.  - tirzepatide (MOUNJARO) 10 MG/0.5ML Pen; Inject 10 mg into the skin once a week.  Dispense: 2 mL; Refill: 0  2. Polyphagia Intensive lifestyle modifications are the first line treatment for this issue. We discussed several lifestyle modifications today and she will continue to work on diet, exercise and weight loss efforts. Kimberly Castro will continue [employer]. She will increase vegetables (non starchy) and protein. Orders and follow up as documented in patient record.  Counseling Polyphagia is excessive hunger. Causes can include: low blood sugars, hypERthyroidism, PMS, lack of sleep, stress, insulin resistance, diabetes, certain medications, and diets that are deficient in protein and fiber.    3. Obesity, current BMI 51.7 Kimberly Castro is currently  in the action stage of change. As such, her goal is to continue with weight loss efforts. She has agreed to the Category 3 Plan.   Kimberly Castro will continue meal planning and intentional eating.  Exercise goals: No exercise has been prescribed at this time.  Behavioral modification strategies: increasing lean protein intake, decreasing simple carbohydrates, increasing vegetables, increasing water intake, decreasing eating out, no skipping meals, meal planning and cooking strategies, keeping healthy foods in the home, and planning for success.  Kimberly Castro has agreed to follow-up with our clinic in 2 weeks. She was informed of the importance of frequent follow-up visits to maximize her success with intensive lifestyle modifications for her multiple health conditions.   Objective:   Blood pressure (!) 149/79, pulse 63, temperature 97.9 F (36.6 C), height 5\' 6"  (1.676 m), weight (!) 320 lb (145.2 kg), SpO2 96 %. Body mass index is 51.65 kg/m.  General: Cooperative, alert, well developed, in no acute distress. HEENT: Conjunctivae and lids unremarkable. Cardiovascular: Regular rhythm.  Lungs: Normal work of breathing. Neurologic: No focal deficits.   Lab Results  Component Value Date   CREATININE 0.96 12/30/2020   BUN 18 12/30/2020   NA 145 (H) 12/30/2020   K 4.3 12/30/2020   CL 102 12/30/2020   CO2 28 12/30/2020   Lab Results  Component Value Date   ALT 35 (H) 12/30/2020   AST 38 12/30/2020   ALKPHOS 175 (H) 12/30/2020   BILITOT 0.3 12/30/2020   Lab Results  Component Value Date   HGBA1C 5.3 12/30/2020   Lab Results  Component Value Date  INSULIN 13.9 12/30/2020   Lab Results  Component Value Date   TSH 1.020 12/30/2020   Lab Results  Component Value Date   CHOL 254 (H) 12/30/2020   HDL 87 12/30/2020   LDLCALC 147 (H) 12/30/2020   TRIG 118 12/30/2020   CHOLHDL 2.3 05/14/2019   Lab Results  Component Value Date   VD25OH 31.3 12/30/2020   Lab Results  Component Value  Date   WBC 8.8 12/30/2020   HGB 12.6 12/30/2020   HCT 38.4 12/30/2020   MCV 86 12/30/2020   PLT 302 12/30/2020   No results found for: IRON, TIBC, FERRITIN  Attestation Statements:   Reviewed by clinician on day of visit: allergies, medications, problem list, medical history, surgical history, family history, social history, and previous encounter notes.   I, Jackson Latino, RMA, am acting as Energy manager for [employer], DO.   I have reviewed the above documentation for accuracy and completeness, and I agree with the above. Corinna Capra, DO

## 2021-04-07 ENCOUNTER — Encounter (INDEPENDENT_AMBULATORY_CARE_PROVIDER_SITE_OTHER): Payer: Self-pay | Admitting: Bariatrics

## 2021-04-12 DIAGNOSIS — Z23 Encounter for immunization: Secondary | ICD-10-CM | POA: Diagnosis not present

## 2021-04-25 ENCOUNTER — Other Ambulatory Visit: Payer: Self-pay

## 2021-04-25 ENCOUNTER — Ambulatory Visit (INDEPENDENT_AMBULATORY_CARE_PROVIDER_SITE_OTHER): Payer: BLUE CROSS/BLUE SHIELD | Admitting: Bariatrics

## 2021-04-25 ENCOUNTER — Encounter (INDEPENDENT_AMBULATORY_CARE_PROVIDER_SITE_OTHER): Payer: Self-pay | Admitting: Bariatrics

## 2021-04-25 VITALS — BP 128/74 | HR 61 | Temp 98.1°F | Ht 66.0 in | Wt 315.0 lb

## 2021-04-25 DIAGNOSIS — Z6841 Body Mass Index (BMI) 40.0 and over, adult: Secondary | ICD-10-CM

## 2021-04-25 DIAGNOSIS — E8881 Metabolic syndrome: Secondary | ICD-10-CM

## 2021-04-25 DIAGNOSIS — I10 Essential (primary) hypertension: Secondary | ICD-10-CM | POA: Diagnosis not present

## 2021-04-25 MED ORDER — TIRZEPATIDE 10 MG/0.5ML ~~LOC~~ SOAJ: 10.0000 mg | SUBCUTANEOUS | 0 refills | Status: DC

## 2021-04-26 ENCOUNTER — Encounter (INDEPENDENT_AMBULATORY_CARE_PROVIDER_SITE_OTHER): Payer: Self-pay | Admitting: Bariatrics

## 2021-04-26 NOTE — Progress Notes (Signed)
Chief Complaint:   OBESITY Kimberly Castro is here to discuss her progress with her obesity treatment plan along with follow-up of her obesity related diagnoses. Kimberly Castro is on the Category 3 Plan and states she is following her eating plan approximately 85% of the time. Kimberly Castro states she is doing 0 minutes 0 times per week.  Today's visit was #: 7 Starting weight: 344 lbs Starting date: 12/30/2020 Today's weight: 315 lbs Today's date: 04/25/2021 Total lbs lost to date: 29 lbs Total lbs lost since last in-office visit: 5 lbs  Interim History: Kimberly Castro is down 5 lbs and doing well overall. She denies any struggles. She is doing well with her water intake.  Subjective:   1. Insulin resistance Kimberly Castro is currently taking Mounjaro.  2. Essential hypertension Kimberly Castro denies side effects. Her blood pressure is controlled.  Assessment/Plan:   1. Insulin resistance Kimberly Castro will continue to work on weight loss, exercise, and decreasing simple carbohydrates to help decrease the risk of diabetes. We will refill Mounjaro 10 mg with no refills.  Kimberly Castro agreed to follow-up with [place] as directed to closely monitor her progress.  - tirzepatide (MOUNJARO) 10 MG/0.5ML Pen; Inject 10 mg into the skin once a week.  Dispense: 2 mL; Refill: 0  2. Essential hypertension Kimberly Castro will continue her medications. She is working on healthy weight loss and exercise to improve blood pressure control. We will watch for signs of hypotension as she continues her lifestyle modifications.  3. Class 3 severe obesity with serious comorbidity and body mass index (BMI) of 50.0 to 59.9 in adult, unspecified obesity type (HCC) Kimberly Castro is currently in the action stage of change. As such, her goal is to continue with weight loss efforts. She has agreed to the Category 3 Plan.   Kimberly Castro will continue meal planning and intentional eating. She will increase protein.   Exercise goals: No exercise has been prescribed at this time.  Behavioral modification  strategies: increasing lean protein intake, decreasing simple carbohydrates, increasing vegetables, increasing water intake, decreasing eating out, no skipping meals, meal planning and cooking strategies, keeping healthy foods in the home, and planning for success.  Kimberly Castro has agreed to follow-up with our clinic in 2-3 weeks. She was informed of the importance of frequent follow-up visits to maximize her success with intensive lifestyle modifications for her multiple health conditions.   Objective:   Blood pressure 128/74, pulse 61, temperature 98.1 F (36.7 C), height 5\' 6"  (1.676 m), weight (!) 315 lb (142.9 kg), SpO2 95 %. Body mass index is 50.84 kg/m.  General: Cooperative, alert, well developed, in no acute distress. HEENT: Conjunctivae and lids unremarkable. Cardiovascular: Regular rhythm.  Lungs: Normal work of breathing. Neurologic: No focal deficits.   Lab Results  Component Value Date   CREATININE 0.96 12/30/2020   BUN 18 12/30/2020   NA 145 (H) 12/30/2020   K 4.3 12/30/2020   CL 102 12/30/2020   CO2 28 12/30/2020   Lab Results  Component Value Date   ALT 35 (H) 12/30/2020   AST 38 12/30/2020   ALKPHOS 175 (H) 12/30/2020   BILITOT 0.3 12/30/2020   Lab Results  Component Value Date   HGBA1C 5.3 12/30/2020   Lab Results  Component Value Date   INSULIN 13.9 12/30/2020   Lab Results  Component Value Date   TSH 1.020 12/30/2020   Lab Results  Component Value Date   CHOL 254 (H) 12/30/2020   HDL 87 12/30/2020   LDLCALC 147 (H) 12/30/2020  TRIG 118 12/30/2020   CHOLHDL 2.3 05/14/2019   Lab Results  Component Value Date   VD25OH 31.3 12/30/2020   Lab Results  Component Value Date   WBC 8.8 12/30/2020   HGB 12.6 12/30/2020   HCT 38.4 12/30/2020   MCV 86 12/30/2020   PLT 302 12/30/2020   No results found for: IRON, TIBC, FERRITIN  Attestation Statements:   Reviewed by clinician on day of visit: allergies, medications, problem list, medical history,  surgical history, family history, social history, and previous encounter notes.  I, Jackson Latino, RMA, am acting as Energy manager for [employer], DO.   I have reviewed the above documentation for accuracy and completeness, and I agree with the above. Corinna Capra, DO

## 2021-04-27 ENCOUNTER — Encounter (INDEPENDENT_AMBULATORY_CARE_PROVIDER_SITE_OTHER): Payer: Self-pay

## 2021-05-11 ENCOUNTER — Telehealth (INDEPENDENT_AMBULATORY_CARE_PROVIDER_SITE_OTHER): Payer: Self-pay

## 2021-05-11 NOTE — Telephone Encounter (Signed)
Pt calling in today.  [facility] is not accepting the "grandfather" coupon for the [facility].  Called and spoke with [facility], Luisa Hart, he states that [facility] is getting kickback from the coupon and they are being charged the full price because the patient does not have type 2 DM.    Also, see my chart message

## 2021-05-17 ENCOUNTER — Ambulatory Visit (INDEPENDENT_AMBULATORY_CARE_PROVIDER_SITE_OTHER): Payer: BLUE CROSS/BLUE SHIELD | Admitting: Bariatrics

## 2021-05-17 ENCOUNTER — Encounter (INDEPENDENT_AMBULATORY_CARE_PROVIDER_SITE_OTHER): Payer: Self-pay

## 2021-05-18 ENCOUNTER — Ambulatory Visit (INDEPENDENT_AMBULATORY_CARE_PROVIDER_SITE_OTHER): Payer: BLUE CROSS/BLUE SHIELD | Admitting: Bariatrics

## 2021-05-18 ENCOUNTER — Telehealth (INDEPENDENT_AMBULATORY_CARE_PROVIDER_SITE_OTHER): Payer: Self-pay

## 2021-05-18 ENCOUNTER — Encounter (INDEPENDENT_AMBULATORY_CARE_PROVIDER_SITE_OTHER): Payer: Self-pay | Admitting: Bariatrics

## 2021-05-18 ENCOUNTER — Other Ambulatory Visit: Payer: Self-pay

## 2021-05-18 ENCOUNTER — Other Ambulatory Visit (INDEPENDENT_AMBULATORY_CARE_PROVIDER_SITE_OTHER): Payer: Self-pay

## 2021-05-18 VITALS — BP 126/73 | HR 64 | Temp 98.3°F | Ht 66.0 in | Wt 310.0 lb

## 2021-05-18 DIAGNOSIS — E8881 Metabolic syndrome: Secondary | ICD-10-CM

## 2021-05-18 DIAGNOSIS — I10 Essential (primary) hypertension: Secondary | ICD-10-CM | POA: Diagnosis not present

## 2021-05-18 DIAGNOSIS — Z6841 Body Mass Index (BMI) 40.0 and over, adult: Secondary | ICD-10-CM | POA: Diagnosis not present

## 2021-05-18 MED ORDER — TIRZEPATIDE 10 MG/0.5ML ~~LOC~~ SOAJ: 10.0000 mg | SUBCUTANEOUS | 0 refills | Status: DC

## 2021-05-18 NOTE — Telephone Encounter (Signed)
Pt called in and stated that she is having trouble at the pharmacy filling her Mounjaro. The pt is requesting it sent to [facility] at [place]. The Pharmacy is requesting to speak to a representative for the refill. Please advise

## 2021-05-18 NOTE — Progress Notes (Signed)
Chief Complaint:   OBESITY Kimberly Castro is here to discuss her progress with her obesity treatment plan along with follow-up of her obesity related diagnoses. Kimberly Castro is on the Category 3 Plan and states she is following her eating plan approximately 50% of the time. Kimberly Castro states she is doing 0 minutes 0 times per week.  Today's visit was #: 8 Starting weight: 344 lbs Starting date: 12/30/2020 Today's weight: 310 lbs Today's date: 05/18/2021 Total lbs lost to date: 34 lbs Total lbs lost since last in-office visit: 5 lbs  Interim History: Kimberly Castro is down 5 lbs since her last visit. She ate a few things that were not on plan but got back on track.   Subjective:   1. Insulin resistance Kimberly Castro is  taking her medications as directed.  2. Essential hypertension Kimberly Castro's blood pressure is controlled. He last blood pressure was 126/73.  Assessment/Plan:   1. Insulin resistance Kimberly Castro will continue to work on weight loss, exercise, and decreasing simple carbohydrates to help decrease the risk of diabetes. We will refill Mounjaro 10 mg weekly with no refills. Kimberly Castro agreed to follow-up with [place] as directed to closely monitor her progress.  - tirzepatide (MOUNJARO) 10 MG/0.5ML Pen; Inject 10 mg into the skin once a week.  Dispense: 2 mL; Refill: 0  2. Essential hypertension Kimberly Castro will continue her medications. She is working on healthy weight loss and exercise to improve blood pressure control. We will watch for signs of hypotension as she continues her lifestyle modifications.  3. Obesity, current BMI 50.1 Kimberly Castro is currently in the action stage of change. As such, her goal is to continue with weight loss efforts. She has agreed to the Category 3 Plan.   Kimberly Castro will continue meal planning and she will continue intentional eating.  Exercise goals: No exercise has been prescribed at this time.  Behavioral modification strategies: increasing lean protein intake, decreasing simple carbohydrates, increasing  vegetables, increasing water intake, decreasing eating out, no skipping meals, meal planning and cooking strategies, keeping healthy foods in the home, and planning for success.  Kimberly Castro has agreed to follow-up with our clinic in 3 weeks with Adah Salvage, FNP or William Hamburger, NP and 6 weeks with myself. She was informed of the importance of frequent follow-up visits to maximize her success with intensive lifestyle modifications for her multiple health conditions.   Objective:   Blood pressure 126/73, pulse 64, temperature 98.3 F (36.8 C), height 5\' 6"  (1.676 m), weight (!) 310 lb (140.6 kg), SpO2 98 %. Body mass index is 50.04 kg/m.  General: Cooperative, alert, well developed, in no acute distress. HEENT: Conjunctivae and lids unremarkable. Cardiovascular: Regular rhythm.  Lungs: Normal work of breathing. Neurologic: No focal deficits.   Lab Results  Component Value Date   CREATININE 0.96 12/30/2020   BUN 18 12/30/2020   NA 145 (H) 12/30/2020   K 4.3 12/30/2020   CL 102 12/30/2020   CO2 28 12/30/2020   Lab Results  Component Value Date   ALT 35 (H) 12/30/2020   AST 38 12/30/2020   ALKPHOS 175 (H) 12/30/2020   BILITOT 0.3 12/30/2020   Lab Results  Component Value Date   HGBA1C 5.3 12/30/2020   Lab Results  Component Value Date   INSULIN 13.9 12/30/2020   Lab Results  Component Value Date   TSH 1.020 12/30/2020   Lab Results  Component Value Date   CHOL 254 (H) 12/30/2020   HDL 87 12/30/2020   LDLCALC 147 (H)  12/30/2020   TRIG 118 12/30/2020   CHOLHDL 2.3 05/14/2019   Lab Results  Component Value Date   VD25OH 31.3 12/30/2020   Lab Results  Component Value Date   WBC 8.8 12/30/2020   HGB 12.6 12/30/2020   HCT 38.4 12/30/2020   MCV 86 12/30/2020   PLT 302 12/30/2020   No results found for: IRON, TIBC, FERRITIN  Attestation Statements:   Reviewed by clinician on day of visit: allergies, medications, problem list, medical history, surgical history,  family history, social history, and previous encounter notes.  I, Jackson Latino, RMA, am acting as Energy manager for [employer], DO.   I have reviewed the above documentation for accuracy and completeness, and I agree with the above. Corinna Capra, DO

## 2021-05-23 NOTE — Telephone Encounter (Signed)
LAST APPOINTMENT DATE: 05/18/21 NEXT APPOINTMENT DATE: 06/07/21   [facility] - [place], [place] - [address] [place] [address] Phone: [phone] Fax: [phone]  Patient is requesting a refill of the following medications: Requested Prescriptions    No prescriptions requested or ordered in this encounter    Date last filled: 05/18/21 Previously prescribed by Dr. Manson Passey  Lab Results  Component Value Date   HGBA1C 5.3 12/30/2020   Lab Results  Component Value Date   LDLCALC 147 (H) 12/30/2020   CREATININE 0.96 12/30/2020   Lab Results  Component Value Date   VD25OH 31.3 12/30/2020    BP Readings from Last 3 Encounters:  05/18/21 126/73  04/25/21 128/74  04/05/21 (!) 149/79

## 2021-05-27 DIAGNOSIS — M170 Bilateral primary osteoarthritis of knee: Secondary | ICD-10-CM | POA: Diagnosis not present

## 2021-05-27 DIAGNOSIS — M545 Low back pain, unspecified: Secondary | ICD-10-CM | POA: Diagnosis not present

## 2021-06-06 ENCOUNTER — Encounter (INDEPENDENT_AMBULATORY_CARE_PROVIDER_SITE_OTHER): Payer: Self-pay

## 2021-06-07 ENCOUNTER — Other Ambulatory Visit: Payer: Self-pay

## 2021-06-07 ENCOUNTER — Encounter (INDEPENDENT_AMBULATORY_CARE_PROVIDER_SITE_OTHER): Payer: Self-pay | Admitting: Family Medicine

## 2021-06-07 ENCOUNTER — Encounter (INDEPENDENT_AMBULATORY_CARE_PROVIDER_SITE_OTHER): Payer: Self-pay | Admitting: Bariatrics

## 2021-06-07 ENCOUNTER — Ambulatory Visit (INDEPENDENT_AMBULATORY_CARE_PROVIDER_SITE_OTHER): Payer: BLUE CROSS/BLUE SHIELD | Admitting: Family Medicine

## 2021-06-07 VITALS — BP 135/85 | HR 65 | Temp 97.9°F | Ht 66.0 in | Wt 302.0 lb

## 2021-06-07 DIAGNOSIS — I10 Essential (primary) hypertension: Secondary | ICD-10-CM

## 2021-06-07 DIAGNOSIS — E8881 Metabolic syndrome: Secondary | ICD-10-CM | POA: Diagnosis not present

## 2021-06-07 DIAGNOSIS — Z9189 Other specified personal risk factors, not elsewhere classified: Secondary | ICD-10-CM | POA: Diagnosis not present

## 2021-06-07 DIAGNOSIS — Z6841 Body Mass Index (BMI) 40.0 and over, adult: Secondary | ICD-10-CM

## 2021-06-07 MED ORDER — TIRZEPATIDE 10 MG/0.5ML ~~LOC~~ SOAJ: 10.0000 mg | SUBCUTANEOUS | 0 refills | Status: DC

## 2021-06-07 NOTE — Progress Notes (Signed)
Chief Complaint:   OBESITY Kimberly Castro is here to discuss her progress with her obesity treatment plan along with follow-up of her obesity related diagnoses. Kimberly Castro is on the Category 3 Plan and states she is following her eating plan approximately 80% of the time. Kimberly Castro states she is doing 0 minutes 0 times per week.  Today's visit was #: 9 Starting weight: 344 lbs Starting date: 12/30/2020 Today's weight: 302 lbs Today's date: 06/07/2021 Total lbs lost to date: 42 Total lbs lost since last in-office visit: 8  Interim History: Kimberly Castro continues to do well with weight loss. She has increased temptations at work but she has made good damage control strategies.  Subjective:   1. Insulin resistance Kimberly Castro is stable on Mounjaro. She is working on avoiding the sugar and simple carbohydrates.  2. Essential hypertension Kimberly Castro's blood pressure is stable on her medications with no side effects noted. She continues to do well with weight loss.   3. At risk for heart disease Kimberly Castro is at a higher than average risk for cardiovascular disease due to obesity.   Assessment/Plan:   1. Insulin resistance Kimberly Castro will continue to work on weight loss, exercise, and decreasing simple carbohydrates to help decrease the risk of diabetes. We will refill Mounjaro for 1 month. Kimberly Castro agreed to follow-up with [place] as directed to closely monitor her progress.  - tirzepatide (MOUNJARO) 10 MG/0.5ML Pen; Inject 10 mg into the skin once a week.  Dispense: 2 mL; Refill: 0  2. Essential hypertension Kimberly Castro will continue with diet and medications to improve blood pressure control. We will follow up on her blood pressure at her next office visit.  3. At risk for heart disease Kimberly Castro was given approximately 15 minutes of coronary artery disease prevention counseling today. She is 60 y.o. female and has risk factors for heart disease including obesity. We discussed intensive lifestyle modifications today with an emphasis on specific  weight loss instructions and strategies.   Repetitive spaced learning was employed today to elicit superior memory formation and behavioral change.  4. Obesity, current BMI 48.8 Kimberly Castro is currently in the action stage of change. As such, her goal is to continue with weight loss efforts. She has agreed to the Category 3 Plan.   Behavioral modification strategies: holiday eating strategies .  Kimberly Castro has agreed to follow-up with our clinic in 3 weeks. She was informed of the importance of frequent follow-up visits to maximize her success with intensive lifestyle modifications for her multiple health conditions.   Objective:   Blood pressure 135/85, pulse 65, temperature 97.9 F (36.6 C), height 5\' 6"  (1.676 m), weight (!) 302 lb (137 kg), SpO2 97 %. Body mass index is 48.74 kg/m.  General: Cooperative, alert, well developed, in no acute distress. HEENT: Conjunctivae and lids unremarkable. Cardiovascular: Regular rhythm.  Lungs: Normal work of breathing. Neurologic: No focal deficits.   Lab Results  Component Value Date   CREATININE 0.96 12/30/2020   BUN 18 12/30/2020   NA 145 (H) 12/30/2020   K 4.3 12/30/2020   CL 102 12/30/2020   CO2 28 12/30/2020   Lab Results  Component Value Date   ALT 35 (H) 12/30/2020   AST 38 12/30/2020   ALKPHOS 175 (H) 12/30/2020   BILITOT 0.3 12/30/2020   Lab Results  Component Value Date   HGBA1C 5.3 12/30/2020   Lab Results  Component Value Date   INSULIN 13.9 12/30/2020   Lab Results  Component Value Date   TSH  1.020 12/30/2020   Lab Results  Component Value Date   CHOL 254 (H) 12/30/2020   HDL 87 12/30/2020   LDLCALC 147 (H) 12/30/2020   TRIG 118 12/30/2020   CHOLHDL 2.3 05/14/2019   Lab Results  Component Value Date   VD25OH 31.3 12/30/2020   Lab Results  Component Value Date   WBC 8.8 12/30/2020   HGB 12.6 12/30/2020   HCT 38.4 12/30/2020   MCV 86 12/30/2020   PLT 302 12/30/2020   No results found for: IRON, TIBC,  FERRITIN  Attestation Statements:   Reviewed by clinician on day of visit: allergies, medications, problem list, medical history, surgical history, family history, social history, and previous encounter notes.   I, Burt Knack, am acting as transcriptionist for Quillian Quince, MD.  I have reviewed the above documentation for accuracy and completeness, and I agree with the above. -  Quillian Quince, MD

## 2021-06-21 ENCOUNTER — Other Ambulatory Visit (INDEPENDENT_AMBULATORY_CARE_PROVIDER_SITE_OTHER): Payer: Self-pay | Admitting: Bariatrics

## 2021-06-21 ENCOUNTER — Encounter (INDEPENDENT_AMBULATORY_CARE_PROVIDER_SITE_OTHER): Payer: Self-pay | Admitting: Bariatrics

## 2021-06-21 DIAGNOSIS — E8881 Metabolic syndrome: Secondary | ICD-10-CM

## 2021-06-21 MED ORDER — TIRZEPATIDE 5 MG/0.5ML ~~LOC~~ SOAJ: 5.0000 mg | SUBCUTANEOUS | 0 refills | Status: DC

## 2021-06-21 NOTE — Telephone Encounter (Signed)
Please review

## 2021-06-26 ENCOUNTER — Encounter (INDEPENDENT_AMBULATORY_CARE_PROVIDER_SITE_OTHER): Payer: Self-pay

## 2021-06-27 ENCOUNTER — Encounter (INDEPENDENT_AMBULATORY_CARE_PROVIDER_SITE_OTHER): Payer: Self-pay | Admitting: Bariatrics

## 2021-06-27 ENCOUNTER — Ambulatory Visit (INDEPENDENT_AMBULATORY_CARE_PROVIDER_SITE_OTHER): Payer: 59 | Admitting: Bariatrics

## 2021-06-27 ENCOUNTER — Other Ambulatory Visit: Payer: Self-pay

## 2021-06-27 VITALS — BP 138/82 | HR 78 | Temp 98.2°F | Ht 66.0 in | Wt 306.0 lb

## 2021-06-27 DIAGNOSIS — R632 Polyphagia: Secondary | ICD-10-CM

## 2021-06-27 DIAGNOSIS — E8881 Metabolic syndrome: Secondary | ICD-10-CM | POA: Diagnosis not present

## 2021-06-27 DIAGNOSIS — I10 Essential (primary) hypertension: Secondary | ICD-10-CM

## 2021-06-27 DIAGNOSIS — Z6841 Body Mass Index (BMI) 40.0 and over, adult: Secondary | ICD-10-CM

## 2021-06-27 MED ORDER — TIRZEPATIDE 10 MG/0.5ML ~~LOC~~ SOAJ: 10.0000 mg | SUBCUTANEOUS | 0 refills | Status: DC

## 2021-06-27 MED ORDER — TIRZEPATIDE 7.5 MG/0.5ML ~~LOC~~ SOAJ: 7.5000 mg | SUBCUTANEOUS | 0 refills | Status: DC

## 2021-06-28 ENCOUNTER — Other Ambulatory Visit (INDEPENDENT_AMBULATORY_CARE_PROVIDER_SITE_OTHER): Payer: Self-pay | Admitting: Bariatrics

## 2021-06-28 ENCOUNTER — Encounter (INDEPENDENT_AMBULATORY_CARE_PROVIDER_SITE_OTHER): Payer: Self-pay | Admitting: Bariatrics

## 2021-06-28 DIAGNOSIS — E8881 Metabolic syndrome: Secondary | ICD-10-CM

## 2021-06-28 MED ORDER — TIRZEPATIDE 10 MG/0.5ML ~~LOC~~ SOAJ: 10.0000 mg | SUBCUTANEOUS | 0 refills | Status: DC

## 2021-06-28 NOTE — Telephone Encounter (Signed)
Please review

## 2021-06-28 NOTE — Progress Notes (Signed)
Chief Complaint:   OBESITY Kimberly Castro is here to discuss her progress with her obesity treatment plan along with follow-up of her obesity related diagnoses. Kimberly Castro is on the Category 3 Plan and states she is following her eating plan approximately 60% of the time. Kimberly Castro states she is walking for 30 minutes 2 times per week.  Today's visit was #: 10 Starting weight: 344 lbs Starting date: 12/30/2020 Today's weight: 306 lbs Today's date: 06/27/2021 Total lbs lost to date: 38 lbs Total lbs lost since last in-office visit: 0  Interim History: Kimberly Castro is up 4 lbs over the holidays.   Subjective:   1. Insulin resistance Kimberly Castro is currently taking Mounjaro.   2. Polyphagia Kimberly Castro is taking Mounjaro currently.  3. Essential hypertension Kimberly Castro's blood pressure is controlled. Her last blood pressure was 135/85.  Assessment/Plan:   1. Insulin resistance We will refill Mounjaro 7.5 mg with no refills. Kimberly Castro will continue to work on weight loss, exercise, and decreasing simple carbohydrates to help decrease the risk of diabetes. Kimberly Castro agreed to follow-up with [place] as directed to closely monitor her progress.  - tirzepatide (MOUNJARO) 7.5 MG/0.5ML Pen; Inject 7.5 mg into the skin once a week.  Dispense: 2 mL; Refill: 0  2. Polyphagia Intensive lifestyle modifications are the first line treatment for this issue. Kimberly Castro will continue taking Mounjaro. She will increase her protein intake. We discussed several lifestyle modifications today and she will continue to work on diet, exercise and weight loss efforts. Orders and follow up as documented in patient record.  Counseling Polyphagia is excessive hunger. Causes can include: low blood sugars, hypERthyroidism, PMS, lack of sleep, stress, insulin resistance, diabetes, certain medications, and diets that are deficient in protein and fiber.    3. Essential hypertension Kimberly Castro will continue taking her medications. She is working on healthy weight loss and exercise  to improve blood pressure control. We will watch for signs of hypotension as she continues her lifestyle modifications.  4. Obesity, current BMI 49.4 Kimberly Castro is currently in the action stage of change. As such, her goal is to continue with weight loss efforts. She has agreed to the Category 3 Plan.   Kimberly Castro will continue meal planning. She will adhere to the plan 80-90%.  Exercise goals:  As is.  Behavioral modification strategies: increasing lean protein intake, decreasing simple carbohydrates, increasing vegetables, increasing water intake, decreasing eating out, no skipping meals, meal planning and cooking strategies, keeping healthy foods in the home, and planning for success.  Kimberly Castro has agreed to follow-up with our clinic in 3-4 weeks. She was informed of the importance of frequent follow-up visits to maximize her success with intensive lifestyle modifications for her multiple health conditions.   Objective:   Blood pressure 138/82, pulse 78, temperature 98.2 F (36.8 C), height 5\' 6"  (1.676 m), weight (!) 306 lb (138.8 kg), SpO2 98 %. Body mass index is 49.39 kg/m.  General: Cooperative, alert, well developed, in no acute distress. HEENT: Conjunctivae and lids unremarkable. Cardiovascular: Regular rhythm.  Lungs: Normal work of breathing. Neurologic: No focal deficits.   Lab Results  Component Value Date   CREATININE 0.96 12/30/2020   BUN 18 12/30/2020   NA 145 (H) 12/30/2020   K 4.3 12/30/2020   CL 102 12/30/2020   CO2 28 12/30/2020   Lab Results  Component Value Date   ALT 35 (H) 12/30/2020   AST 38 12/30/2020   ALKPHOS 175 (H) 12/30/2020   BILITOT 0.3 12/30/2020   Lab  Results  Component Value Date   HGBA1C 5.3 12/30/2020   Lab Results  Component Value Date   INSULIN 13.9 12/30/2020   Lab Results  Component Value Date   TSH 1.020 12/30/2020   Lab Results  Component Value Date   CHOL 254 (H) 12/30/2020   HDL 87 12/30/2020   LDLCALC 147 (H) 12/30/2020   TRIG  118 12/30/2020   CHOLHDL 2.3 05/14/2019   Lab Results  Component Value Date   VD25OH 31.3 12/30/2020   Lab Results  Component Value Date   WBC 8.8 12/30/2020   HGB 12.6 12/30/2020   HCT 38.4 12/30/2020   MCV 86 12/30/2020   PLT 302 12/30/2020   No results found for: IRON, TIBC, FERRITIN  Attestation Statements:   Reviewed by clinician on day of visit: allergies, medications, problem list, medical history, surgical history, family history, social history, and previous encounter notes.  I, Jackson Latino, RMA, am acting as Energy manager for [employer], DO.  I have reviewed the above documentation for accuracy and completeness, and I agree with the above. Corinna Capra, DO

## 2021-06-28 NOTE — Progress Notes (Deleted)
Chief Complaint:   OBESITY Kimberly Castro is here to discuss her progress with her obesity treatment plan along with follow-up of her obesity related diagnoses. Kimberly Castro is on the Category 3 Plan and states she is following her eating plan approximately 60% of the time. Kimberly Castro states she is walking for 30 minutes 2 times per week.  Today's visit was #: 10 Starting weight: 344 lbs Starting date: 12/30/2020 Today's weight: 306 lbs Today's date: 06/27/2021 Total lbs lost to date: 38 lbs Total lbs lost since last in-office visit: 0  Interim History: Kimberly Castro is up 4 lbs over the holidays.   Subjective:   1. Insulin resistance Kimberly Castro is currently taking Mounjaro.   2. Polyphagia Kimberly Castro is taking Mounjaro currently.  3. Essential hypertension Kimberly Castro's blood pressure is controlled. Her last blood pressure was 135/85.  Assessment/Plan:   1. Insulin resistance We will refill Mounjaro 7.5 mg with no refills. Kimberly Castro will continue to work on weight loss, exercise, and decreasing simple carbohydrates to help decrease the risk of diabetes. Kimberly Castro agreed to follow-up with [place] as directed to closely monitor her progress.  - tirzepatide (MOUNJARO) 7.5 MG/0.5ML Pen; Inject 7.5 mg into the skin once a week.  Dispense: 2 mL; Refill: 0  2. Polyphagia Intensive lifestyle modifications are the first line treatment for this issue. Kimberly Castro will continue taking Mounjaro. She will increase her protein intake. We discussed several lifestyle modifications today and she will continue to work on diet, exercise and weight loss efforts. Orders and follow up as documented in patient record.  Counseling Polyphagia is excessive hunger. Causes can include: low blood sugars, hypERthyroidism, PMS, lack of sleep, stress, insulin resistance, diabetes, certain medications, and diets that are deficient in protein and fiber.    3. Essential hypertension Kimberly Castro will continue taking her medications. She is working on healthy weight loss and exercise  to improve blood pressure control. We will watch for signs of hypotension as she continues her lifestyle modifications.  4. Obesity, current BMI 49.4 Kimberly Castro is currently in the action stage of change. As such, her goal is to continue with weight loss efforts. She has agreed to the Category 3 Plan.   Kimberly Castro will continue meal planning. She will adhere to the plan 80-90%.  Exercise goals:  As is.  Behavioral modification strategies: increasing lean protein intake, decreasing simple carbohydrates, increasing vegetables, increasing water intake, decreasing eating out, no skipping meals, meal planning and cooking strategies, keeping healthy foods in the home, and planning for success.  Kimberly Castro has agreed to follow-up with our clinic in 3-4 weeks. She was informed of the importance of frequent follow-up visits to maximize her success with intensive lifestyle modifications for her multiple health conditions.   Objective:   Blood pressure 138/82, pulse 78, temperature 98.2 F (36.8 C), height 5\' 6"  (1.676 m), weight (!) 306 lb (138.8 kg), SpO2 98 %. Body mass index is 49.39 kg/m.  General: Cooperative, alert, well developed, in no acute distress. HEENT: Conjunctivae and lids unremarkable. Cardiovascular: Regular rhythm.  Lungs: Normal work of breathing. Neurologic: No focal deficits.   Lab Results  Component Value Date   CREATININE 0.96 12/30/2020   BUN 18 12/30/2020   NA 145 (H) 12/30/2020   K 4.3 12/30/2020   CL 102 12/30/2020   CO2 28 12/30/2020   Lab Results  Component Value Date   ALT 35 (H) 12/30/2020   AST 38 12/30/2020   ALKPHOS 175 (H) 12/30/2020   BILITOT 0.3 12/30/2020   Lab  Results  °Component Value Date  ° HGBA1C 5.3 12/30/2020  ° °Lab Results  °Component Value Date  ° INSULIN 13.9 12/30/2020  ° °Lab Results  °Component Value Date  ° TSH 1.020 12/30/2020  ° °Lab Results  °Component Value Date  ° CHOL 254 (H) 12/30/2020  ° HDL 87 12/30/2020  ° LDLCALC 147 (H) 12/30/2020  ° TRIG  118 12/30/2020  ° CHOLHDL 2.3 05/14/2019  ° °Lab Results  °Component Value Date  ° VD25OH 31.3 12/30/2020  ° °Lab Results  °Component Value Date  ° WBC 8.8 12/30/2020  ° HGB 12.6 12/30/2020  ° HCT 38.4 12/30/2020  ° MCV 86 12/30/2020  ° PLT 302 12/30/2020  ° °No results found for: IRON, TIBC, FERRITIN ° °Attestation Statements:  ° °Reviewed by clinician on day of visit: allergies, medications, problem list, medical history, surgical history, family history, social history, and previous encounter notes. ° °I, Taijah Macrae, RMA, am acting as transcriptionist for Angel Brown, DO. ° °I have reviewed the above documentation for accuracy and completeness, and I agree with the above. -  Angel Brown, DO ° °

## 2021-06-28 NOTE — Progress Notes (Deleted)
Chief Complaint:   OBESITY Georgianne is here to discuss her progress with her obesity treatment plan along with follow-up of her obesity related diagnoses. Erryn is on the Category 3 Plan and states she is following her eating plan approximately 60% of the time. Yoceline states she is walking for 30 minutes 2 times per week.  Today's visit was #: 10 Starting weight: 344 lbs Starting date: 12/30/2020 Today's weight: 306 lbs Today's date: 06/27/2021 Total lbs lost to date: 38 lbs Total lbs lost since last in-office visit: 0  Interim History: Sieara is up 4 lbs over the holidays.   Subjective:   1. Insulin resistance Kiondra is currently taking Mounjaro.   2. Polyphagia Tenia is taking Mounjaro currently.  3. Essential hypertension Shelitha's blood pressure is controlled. Her last blood pressure was 135/85.  Assessment/Plan:   1. Insulin resistance We will refill Mounjaro 7.5 mg with no refills. Azoria will continue to work on weight loss, exercise, and decreasing simple carbohydrates to help decrease the risk of diabetes. Lorana agreed to follow-up with [place] as directed to closely monitor her progress.  - tirzepatide (MOUNJARO) 7.5 MG/0.5ML Pen; Inject 7.5 mg into the skin once a week.  Dispense: 2 mL; Refill: 0  2. Polyphagia Intensive lifestyle modifications are the first line treatment for this issue. Tami will continue taking Mounjaro. She will increase her protein intake. We discussed several lifestyle modifications today and she will continue to work on diet, exercise and weight loss efforts. Orders and follow up as documented in patient record.  Counseling Polyphagia is excessive hunger. Causes can include: low blood sugars, hypERthyroidism, PMS, lack of sleep, stress, insulin resistance, diabetes, certain medications, and diets that are deficient in protein and fiber.    3. Essential hypertension Yesica will continue taking her medications. She is working on healthy weight loss and exercise  to improve blood pressure control. We will watch for signs of hypotension as she continues her lifestyle modifications.  4. Obesity, current BMI 49.4 Valborg is currently in the action stage of change. As such, her goal is to continue with weight loss efforts. She has agreed to the Category 3 Plan.   Lauriann will continue meal planning. She will adhere to the plan 80-90%.  Exercise goals:  As is.  Behavioral modification strategies: increasing lean protein intake, decreasing simple carbohydrates, increasing vegetables, increasing water intake, decreasing eating out, no skipping meals, meal planning and cooking strategies, keeping healthy foods in the home, and planning for success.  Sayla has agreed to follow-up with our clinic in 3-4 weeks. She was informed of the importance of frequent follow-up visits to maximize her success with intensive lifestyle modifications for her multiple health conditions.   Objective:   Blood pressure 138/82, pulse 78, temperature 98.2 F (36.8 C), height 5\' 6"  (1.676 m), weight (!) 306 lb (138.8 kg), SpO2 98 %. Body mass index is 49.39 kg/m.  General: Cooperative, alert, well developed, in no acute distress. HEENT: Conjunctivae and lids unremarkable. Cardiovascular: Regular rhythm.  Lungs: Normal work of breathing. Neurologic: No focal deficits.   Lab Results  Component Value Date   CREATININE 0.96 12/30/2020   BUN 18 12/30/2020   NA 145 (H) 12/30/2020   K 4.3 12/30/2020   CL 102 12/30/2020   CO2 28 12/30/2020   Lab Results  Component Value Date   ALT 35 (H) 12/30/2020   AST 38 12/30/2020   ALKPHOS 175 (H) 12/30/2020   BILITOT 0.3 12/30/2020   Lab  Results  Component Value Date   HGBA1C 5.3 12/30/2020   Lab Results  Component Value Date   INSULIN 13.9 12/30/2020   Lab Results  Component Value Date   TSH 1.020 12/30/2020   Lab Results  Component Value Date   CHOL 254 (H) 12/30/2020   HDL 87 12/30/2020   LDLCALC 147 (H) 12/30/2020   TRIG  118 12/30/2020   CHOLHDL 2.3 05/14/2019   Lab Results  Component Value Date   VD25OH 31.3 12/30/2020   Lab Results  Component Value Date   WBC 8.8 12/30/2020   HGB 12.6 12/30/2020   HCT 38.4 12/30/2020   MCV 86 12/30/2020   PLT 302 12/30/2020   No results found for: IRON, TIBC, FERRITIN  Attestation Statements:   Reviewed by clinician on day of visit: allergies, medications, problem list, medical history, surgical history, family history, social history, and previous encounter notes.  I, Jackson Latino, RMA, am acting as Energy manager for [employer], DO.  I have reviewed the above documentation for accuracy and completeness, and I agree with the above. Bascom Levels, Odis Hollingshead, CMA

## 2021-07-14 ENCOUNTER — Ambulatory Visit
Admission: RE | Admit: 2021-07-14 | Discharge: 2021-07-14 | Disposition: A | Discharge status: Home / Self Care | Payer: 59 | Source: Ambulatory Visit | Attending: Sports Medicine | Admitting: Sports Medicine

## 2021-07-14 ENCOUNTER — Other Ambulatory Visit: Payer: Self-pay

## 2021-07-14 ENCOUNTER — Other Ambulatory Visit: Payer: Self-pay | Admitting: Sports Medicine

## 2021-07-14 DIAGNOSIS — R52 Pain, unspecified: Secondary | ICD-10-CM

## 2021-07-18 ENCOUNTER — Encounter (INDEPENDENT_AMBULATORY_CARE_PROVIDER_SITE_OTHER): Payer: Self-pay | Admitting: Bariatrics

## 2021-07-18 ENCOUNTER — Ambulatory Visit (INDEPENDENT_AMBULATORY_CARE_PROVIDER_SITE_OTHER): Payer: 59 | Admitting: Bariatrics

## 2021-07-18 ENCOUNTER — Other Ambulatory Visit: Payer: Self-pay

## 2021-07-18 VITALS — BP 148/71 | HR 63 | Temp 98.2°F | Ht 66.0 in | Wt 296.0 lb

## 2021-07-18 DIAGNOSIS — Z6841 Body Mass Index (BMI) 40.0 and over, adult: Secondary | ICD-10-CM

## 2021-07-18 DIAGNOSIS — E8881 Metabolic syndrome: Secondary | ICD-10-CM

## 2021-07-18 DIAGNOSIS — E7800 Pure hypercholesterolemia, unspecified: Secondary | ICD-10-CM | POA: Diagnosis not present

## 2021-07-18 DIAGNOSIS — E559 Vitamin D deficiency, unspecified: Secondary | ICD-10-CM

## 2021-07-18 DIAGNOSIS — R632 Polyphagia: Secondary | ICD-10-CM

## 2021-07-18 DIAGNOSIS — E669 Obesity, unspecified: Secondary | ICD-10-CM

## 2021-07-18 MED ORDER — TIRZEPATIDE 10 MG/0.5ML ~~LOC~~ SOAJ: 10.0000 mg | SUBCUTANEOUS | 0 refills | Status: DC

## 2021-07-18 NOTE — Progress Notes (Signed)
Chief Complaint:   OBESITY Kimberly Castro is here to discuss her progress with her obesity treatment plan along with follow-up of her obesity related diagnoses. Kimberly Castro is on the Category 3 Plan and states she is following her eating plan approximately 80% of the time. Kimberly Castro states she is doing 0 minutes 0 times per week.  Today's visit was #: 11 Starting weight: 344 lbs Starting date: 12/30/2020 Today's weight: 296 lbs Today's date: 07/18/2021 Total lbs lost to date: 48 lbs Total lbs lost since last in-office visit: 10 lbs  Interim History: Kimberly Castro is down an additional 10 lbs. She is having some back issues. She is on prednisone taper (increased appetite).   Subjective:   1. Polyphagia Kimberly Castro is taking Mounjaro currently.  2. Insulin resistance Kimberly Castro is currently taking Mounjaro.  3. Vitamin D deficiency Kimberly Castro is taking Vitamin D currently.   4. Pure hypercholesterolemia Kimberly Castro is not on medications currenlty. Statin intolerant.   Assessment/Plan:   1. Polyphagia Kimberly Castro will continue Mounjaro. We will refill Mounjaro 10 mg for 1 month with no refills. We will check labs today. Intensive lifestyle modifications are the first line treatment for this issue. We discussed several lifestyle modifications today and she will continue to work on diet, exercise and weight loss efforts. Orders and follow up as documented in patient record.  Counseling Polyphagia is excessive hunger. Causes can include: low blood sugars, hypERthyroidism, PMS, lack of sleep, stress, insulin resistance, diabetes, certain medications, and diets that are deficient in protein and fiber.    - Comprehensive metabolic panel - Insulin, random - Hemoglobin A1c  - tirzepatide (MOUNJARO) 10 MG/0.5ML Pen; Inject 10 mg into the skin once a week.  Dispense: 2 mL; Refill: 0  2. Insulin resistance Kimberly Castro will continue Mounjaro. We will check A1C and insulin today. to work on weight loss, exercise, and decreasing simple carbohydrates to  help decrease the risk of diabetes. Kimberly Castro agreed to follow-up with Kimberly Castro as directed to closely monitor her progress.   - Hemoglobin A1c - Insulin, random  3. Vitamin D deficiency Low Vitamin D level contributes to fatigue and are associated with obesity, breast, and colon cancer.  Lyrical will continue taking prescription Vitamin D 50,000 IU every week and she will follow-up for routine testing of Vitamin D, at least 2-3 times per year to avoid over-replacement. We will check Vitamin D today.  - VITAMIN D 25 Hydroxy (Vit-D Deficiency, Fractures)  4. Pure hypercholesterolemia Cardiovascular risk and specific lipid/LDL goals reviewed.  We will check cholesterol today. We discussed several lifestyle modifications today and [name] will continue to work on diet, exercise and weight loss efforts. Orders and follow up as documented in patient record.   Counseling Intensive lifestyle modifications are the first line treatment for this issue. Dietary changes: Increase soluble fiber. Decrease simple carbohydrates. Exercise changes: Moderate to vigorous-intensity aerobic activity 150 minutes per week if tolerated. Lipid-lowering medications: see documented in medical record.  - Insulin, random - Lipid Panel With LDL/HDL Ratio  5. Obesity, current BMI 47.4 [name] is currently in the action stage of change. As such, her goal is to continue with weight loss efforts. She has agreed to the Category 3 Plan.   [name] will continue meal planning.  Exercise goals:  Kimberly Castro will consider chair exercise.  Behavioral modification strategies: increasing lean protein intake, decreasing simple carbohydrates, increasing vegetables, increasing water intake, decreasing eating out, no skipping meals, meal planning and cooking strategies, keeping healthy foods in the home, and planning  for success.  Kimberly Castro has agreed to follow-up with our clinic in 3-4 weeks. She was informed of the importance of frequent follow-up visits to  maximize her success with intensive lifestyle modifications for her multiple health conditions.   Kimberly Castro was informed we would discuss her lab results at her next visit unless there is a critical issue that needs to be addressed sooner. Kimberly Castro agreed to keep her next visit at the agreed upon time to discuss these results.  Objective:   Blood pressure (!) 148/71, pulse 63, temperature 98.2 F (36.8 C), height 5\' 6"  (1.676 m), weight 296 lb (134.3 kg), SpO2 98 %. Body mass index is 47.78 kg/m.  General: Cooperative, alert, well developed, in no acute distress. HEENT: Conjunctivae and lids unremarkable. Cardiovascular: Regular rhythm.  Lungs: Normal work of breathing. Neurologic: No focal deficits.   Lab Results  Component Value Date   CREATININE 0.96 12/30/2020   BUN 18 12/30/2020   NA 145 (H) 12/30/2020   K 4.3 12/30/2020   CL 102 12/30/2020   CO2 28 12/30/2020   Lab Results  Component Value Date   ALT 35 (H) 12/30/2020   AST 38 12/30/2020   ALKPHOS 175 (H) 12/30/2020   BILITOT 0.3 12/30/2020   Lab Results  Component Value Date   HGBA1C 5.3 12/30/2020   Lab Results  Component Value Date   INSULIN 13.9 12/30/2020   Lab Results  Component Value Date   TSH 1.020 12/30/2020   Lab Results  Component Value Date   CHOL 254 (H) 12/30/2020   HDL 87 12/30/2020   LDLCALC 147 (H) 12/30/2020   TRIG 118 12/30/2020   CHOLHDL 2.3 05/14/2019   Lab Results  Component Value Date   VD25OH 31.3 12/30/2020   Lab Results  Component Value Date   WBC 8.8 12/30/2020   HGB 12.6 12/30/2020   HCT 38.4 12/30/2020   MCV 86 12/30/2020   PLT 302 12/30/2020   No results found for: IRON, TIBC, FERRITIN  Attestation Statements:   Reviewed by clinician on day of visit: allergies, medications, problem list, medical history, surgical history, family history, social history, and previous encounter notes.  I, 01/01/2021, RMA, am acting as Jackson Latino for Energy manager, DO.  I have  reviewed the above documentation for accuracy and completeness, and I agree with the above. [employer], DO

## 2021-07-19 LAB — COMPREHENSIVE METABOLIC PANEL
ALT: 21 IU/L (ref 0–32)
AST: 21 IU/L (ref 0–40)
Albumin/Globulin Ratio: 1.7 (ref 1.2–2.2)
Albumin: 4.3 g/dL (ref 3.8–4.9)
Alkaline Phosphatase: 142 IU/L — ABNORMAL HIGH (ref 44–121)
BUN/Creatinine Ratio: 20 (ref 12–28)
BUN: 24 mg/dL (ref 8–27)
Bilirubin Total: 0.3 mg/dL (ref 0.0–1.2)
CO2: 29 mmol/L (ref 20–29)
Calcium: 9.5 mg/dL (ref 8.7–10.3)
Chloride: 100 mmol/L (ref 96–106)
Creatinine, Ser: 1.23 mg/dL — ABNORMAL HIGH (ref 0.57–1.00)
Globulin, Total: 2.6 g/dL (ref 1.5–4.5)
Glucose: 74 mg/dL (ref 70–99)
Potassium: 3.9 mmol/L (ref 3.5–5.2)
Sodium: 144 mmol/L (ref 134–144)
Total Protein: 6.9 g/dL (ref 6.0–8.5)
eGFR: 50 mL/min/{1.73_m2} — ABNORMAL LOW (ref >59)

## 2021-07-19 LAB — HEMOGLOBIN A1C
Est. average glucose Bld gHb Est-mCnc: 94 mg/dL
Hgb A1c MFr Bld: 4.9 % (ref 4.8–5.6)

## 2021-07-19 LAB — LIPID PANEL WITH LDL/HDL RATIO
Cholesterol, Total: 251 mg/dL — ABNORMAL HIGH (ref 100–199)
HDL: 88 mg/dL (ref >39)
LDL Chol Calc (NIH): 150 mg/dL — ABNORMAL HIGH (ref 0–99)
LDL/HDL Ratio: 1.7 ratio (ref 0.0–3.2)
Triglycerides: 78 mg/dL (ref 0–149)
VLDL Cholesterol Cal: 13 mg/dL (ref 5–40)

## 2021-07-19 LAB — VITAMIN D 25 HYDROXY (VIT D DEFICIENCY, FRACTURES): Vit D, 25-Hydroxy: 35.6 ng/mL (ref 30.0–100.0)

## 2021-07-19 LAB — INSULIN, RANDOM: INSULIN: 7.7 u[IU]/mL (ref 2.6–24.9)

## 2021-07-25 ENCOUNTER — Other Ambulatory Visit (INDEPENDENT_AMBULATORY_CARE_PROVIDER_SITE_OTHER): Payer: Self-pay | Admitting: Bariatrics

## 2021-07-25 ENCOUNTER — Encounter (INDEPENDENT_AMBULATORY_CARE_PROVIDER_SITE_OTHER): Payer: Self-pay | Admitting: Bariatrics

## 2021-07-25 DIAGNOSIS — E8881 Metabolic syndrome: Secondary | ICD-10-CM

## 2021-07-25 MED ORDER — TIRZEPATIDE 12.5 MG/0.5ML ~~LOC~~ SOAJ: 12.5000 mg | SUBCUTANEOUS | 0 refills | Status: DC

## 2021-07-25 NOTE — Telephone Encounter (Signed)
Please review

## 2021-08-16 ENCOUNTER — Encounter (INDEPENDENT_AMBULATORY_CARE_PROVIDER_SITE_OTHER): Payer: Self-pay | Admitting: Bariatrics

## 2021-08-16 ENCOUNTER — Other Ambulatory Visit: Payer: Self-pay

## 2021-08-16 ENCOUNTER — Ambulatory Visit (INDEPENDENT_AMBULATORY_CARE_PROVIDER_SITE_OTHER): Payer: 59 | Admitting: Bariatrics

## 2021-08-16 VITALS — BP 123/79 | HR 70 | Temp 98.0°F | Ht 66.0 in | Wt 293.0 lb

## 2021-08-16 DIAGNOSIS — R632 Polyphagia: Secondary | ICD-10-CM | POA: Diagnosis not present

## 2021-08-16 DIAGNOSIS — E7800 Pure hypercholesterolemia, unspecified: Secondary | ICD-10-CM

## 2021-08-16 DIAGNOSIS — E669 Obesity, unspecified: Secondary | ICD-10-CM

## 2021-08-16 DIAGNOSIS — E8881 Metabolic syndrome: Secondary | ICD-10-CM | POA: Diagnosis not present

## 2021-08-16 DIAGNOSIS — Z6841 Body Mass Index (BMI) 40.0 and over, adult: Secondary | ICD-10-CM

## 2021-08-16 MED ORDER — TIRZEPATIDE 12.5 MG/0.5ML ~~LOC~~ SOAJ: 12.5000 mg | SUBCUTANEOUS | 0 refills | Status: DC

## 2021-08-16 NOTE — Progress Notes (Signed)
Chief Complaint:   OBESITY Kimberly Castro is here to discuss her progress with her obesity treatment plan along with follow-up of her obesity related diagnoses. Kimberly Castro is on the Category 3 Plan and states she is following her eating plan approximately 60% of the time. [name] states she is doing 0 minutes 0 times per week.  Today's visit was #: 12 Starting weight: 344 lbs Starting date: 12/30/2020 Today's weight: 293 lbs Today's date: 08/16/2021 Total lbs lost to date: 51 lbs Total lbs lost since last in-office visit: 3 lbs  Interim History: Kimberly Castro is down an additional 3 lbs since her last visit.  Subjective:   1. Polyphagia [name] states Kimberly Castro helps with hunger. She notes no side effects.   2. Insulin resistance Kimberly Castro is currently taking Mounjaro.   3. Pure hypercholesterolemia Kimberly Castro is not on medications currently.   Assessment/Plan:   1. Polyphagia Intensive lifestyle modifications are the first line treatment for this issue. We will refill Mounjaro 12.5 mg for 1 month with no refills. We discussed several lifestyle modifications today and she will continue to work on diet, exercise and weight loss efforts. Orders and follow up as documented in patient record.  Counseling Polyphagia is excessive hunger. Causes can include: low blood sugars, hypERthyroidism, PMS, lack of sleep, stress, insulin resistance, diabetes, certain medications, and diets that are deficient in protein and fiber.    - tirzepatide (MOUNJARO) 12.5 MG/0.5ML Pen; Inject 12.5 mg into the skin once a week.  Dispense: 2 mL; Refill: 0  2. Insulin resistance Kimberly Castro will continue taking Mounjaro. She will keep carbohydrates low. She will continue to work on weight loss, exercise, and decreasing simple carbohydrates to help decrease the risk of diabetes. Kimberly Castro agreed to follow-up with Kimberly Castro as directed to closely monitor her progress.  3. Pure hypercholesterolemia Cardiovascular risk and specific lipid/LDL goals reviewed.  [name]  will have zero trans fats. She will be as active as possible. We discussed several lifestyle modifications today and Kimberly Castro will continue to work on diet, exercise and weight loss efforts. Orders and follow up as documented in patient record.   Counseling Intensive lifestyle modifications are the first line treatment for this issue. Dietary changes: Increase soluble fiber. Decrease simple carbohydrates. Exercise changes: Moderate to vigorous-intensity aerobic activity 150 minutes per week if tolerated. Lipid-lowering medications: see documented in medical record.  4. Obesity with current BMI of 47.3 [name] is currently in the action stage of change. As such, her goal is to continue with weight loss efforts. She has agreed to the Category 3 Plan.   Kimberly Castro will adhere to the plan 80-90%. She will continue meal planning. We reviewed labs from 07/18/2021 CMP, Lipid, Vitamin D, A1C and insulin.  Exercise goals:  Kimberly Castro will start to walk. She will see a specialist for her back.   Behavioral modification strategies: increasing lean protein intake, decreasing simple carbohydrates, increasing vegetables, increasing water intake, decreasing eating out, no skipping meals, meal planning and cooking strategies, keeping healthy foods in the home, and planning for success.  Kimberly Castro has agreed to follow-up with our clinic in 3-4 weeks. She was informed of the importance of frequent follow-up visits to maximize her success with intensive lifestyle modifications for her multiple health conditions.   Objective:   Blood pressure 123/79, pulse 70, temperature 98 F (36.7 C), height 5\' 6"  (1.676 m), weight 293 lb (132.9 kg), SpO2 96 %. Body mass index is 47.29 kg/m.  General: Cooperative, alert, well developed, in no acute distress.  HEENT: Conjunctivae and lids unremarkable. Cardiovascular: Regular rhythm.  Lungs: Normal work of breathing. Neurologic: No focal deficits.   Lab Results  Component Value Date    CREATININE 1.23 (H) 07/18/2021   BUN 24 07/18/2021   NA 144 07/18/2021   K 3.9 07/18/2021   CL 100 07/18/2021   CO2 29 07/18/2021   Lab Results  Component Value Date   ALT 21 07/18/2021   AST 21 07/18/2021   ALKPHOS 142 (H) 07/18/2021   BILITOT 0.3 07/18/2021   Lab Results  Component Value Date   HGBA1C 4.9 07/18/2021   HGBA1C 5.3 12/30/2020   Lab Results  Component Value Date   INSULIN 7.7 07/18/2021   INSULIN 13.9 12/30/2020   Lab Results  Component Value Date   TSH 1.020 12/30/2020   Lab Results  Component Value Date   CHOL 251 (H) 07/18/2021   HDL 88 07/18/2021   LDLCALC 150 (H) 07/18/2021   TRIG 78 07/18/2021   CHOLHDL 2.3 05/14/2019   Lab Results  Component Value Date   VD25OH 35.6 07/18/2021   VD25OH 31.3 12/30/2020   Lab Results  Component Value Date   WBC 8.8 12/30/2020   HGB 12.6 12/30/2020   HCT 38.4 12/30/2020   MCV 86 12/30/2020   PLT 302 12/30/2020   No results found for: IRON, TIBC, FERRITIN   Attestation Statements:   Reviewed by clinician on day of visit: allergies, medications, problem list, medical history, surgical history, family history, social history, and previous encounter notes.  I, Jackson Latino, RMA, am acting as Energy manager for [employer], DO.  I have reviewed the above documentation for accuracy and completeness, and I agree with the above. Corinna Capra, DO

## 2021-08-17 ENCOUNTER — Encounter (INDEPENDENT_AMBULATORY_CARE_PROVIDER_SITE_OTHER): Payer: Self-pay

## 2021-09-06 ENCOUNTER — Encounter (INDEPENDENT_AMBULATORY_CARE_PROVIDER_SITE_OTHER): Payer: Self-pay | Admitting: Bariatrics

## 2021-09-06 ENCOUNTER — Other Ambulatory Visit: Payer: Self-pay

## 2021-09-06 ENCOUNTER — Ambulatory Visit (INDEPENDENT_AMBULATORY_CARE_PROVIDER_SITE_OTHER): Payer: 59 | Admitting: Bariatrics

## 2021-09-06 VITALS — BP 126/61 | HR 72 | Temp 98.0°F | Ht 66.0 in | Wt 293.0 lb

## 2021-09-06 DIAGNOSIS — R632 Polyphagia: Secondary | ICD-10-CM | POA: Diagnosis not present

## 2021-09-06 DIAGNOSIS — E669 Obesity, unspecified: Secondary | ICD-10-CM | POA: Diagnosis not present

## 2021-09-06 DIAGNOSIS — E8881 Metabolic syndrome: Secondary | ICD-10-CM | POA: Diagnosis not present

## 2021-09-06 DIAGNOSIS — Z6841 Body Mass Index (BMI) 40.0 and over, adult: Secondary | ICD-10-CM

## 2021-09-06 MED ORDER — TIRZEPATIDE 15 MG/0.5ML ~~LOC~~ SOAJ: 15.0000 mg | SUBCUTANEOUS | 0 refills | Status: DC

## 2021-09-08 ENCOUNTER — Encounter (INDEPENDENT_AMBULATORY_CARE_PROVIDER_SITE_OTHER): Payer: Self-pay

## 2021-09-08 ENCOUNTER — Telehealth (INDEPENDENT_AMBULATORY_CARE_PROVIDER_SITE_OTHER): Payer: Self-pay | Admitting: Bariatrics

## 2021-09-08 NOTE — Telephone Encounter (Signed)
Prior authorization denied Mounjaro. Patient already uses copay card. Patient sent denial message via mychart.  ?

## 2021-09-08 NOTE — Progress Notes (Signed)
? ? ? ?Chief Complaint:  ? ?OBESITY ?Kimberly Castro is here to discuss her progress with her obesity treatment plan along with follow-up of her obesity related diagnoses. Kimberly Castro is on the Category 3 Plan and states she is following her eating plan approximately 75-80% of the time. Kimberly Castro states she is doing 0 minutes 0 times per week. ? ?Today's visit was #: 13 ?Starting weight: 344 lbs ?Starting date: 12/30/2020 ?Today's weight: 293 lbs ?Today's date: 09/06/2021 ?Total lbs lost to date: 51 lbs ?Total lbs lost since last in-office visit: 0 ? ?Interim History: Kimberly Castro's weight remains the same as her last visit.  ? ?Subjective:  ? ?1. Insulin resistance ?Briahna is taking Mounjaro currently. She was unable to get for 2 weeks.  ? ?2. Polyphagia ?Johny is currently taking Mounjaro.  ? ?Assessment/Plan:  ? ?1. Insulin resistance ?Sirenia will continue [employer]. She will keep protein levels high. She will continue to work on weight loss, exercise, and decreasing simple carbohydrates to help decrease the risk of diabetes. Naviyah agreed to follow-up with [place] as directed to closely monitor her progress. ? ?2. Polyphagia ?Intensive lifestyle modifications are the first line treatment for this issue. Berneta will continue [employer]. She will not meal skipping. She will keep water and protein intake high. We will refill Mounjaro 15 mg for 1 month with no refills We discussed several lifestyle modifications today and she will continue to work on diet, exercise and weight loss efforts. Orders and follow up as documented in patient record. ? ?Counseling ?Polyphagia is excessive hunger. ?Causes can include: low blood sugars, hypERthyroidism, PMS, lack of sleep, stress, insulin resistance, diabetes, certain medications, and diets that are deficient in protein and fiber.  ? ?- tirzepatide (MOUNJARO) 15 MG/0.5ML Pen; Inject 15 mg into the skin once a week.  Dispense: 2 mL; Refill: 0 ? ?3. Obesity with current BMI of 47.4 ?Kimberly Castro is currently in the action stage of  change. As such, her goal is to continue with weight loss efforts. She has agreed to the Category 3 Plan.  ? ?Kimberly Castro will continue meal planning and she will continue intentional eating. She will keep her protein and water intake high. ? ?Exercise goals:  Kimberly Castro will start walking some. ? ?Behavioral modification strategies: increasing lean protein intake, decreasing simple carbohydrates, increasing vegetables, increasing water intake, decreasing eating out, no skipping meals, meal planning and cooking strategies, keeping healthy foods in the home, and planning for success. ? ?Kimberly Castro has agreed to follow-up with our clinic in 4 weeks. She was informed of the importance of frequent follow-up visits to maximize her success with intensive lifestyle modifications for her multiple health conditions.  ? ?Objective:  ? ?Blood pressure 126/61, pulse 72, temperature 98 ?F (36.7 ?C), height 5\' 6"  (1.676 m), weight 293 lb (132.9 kg), SpO2 100 %. ?Body mass index is 47.29 kg/m?. ? ?General: Cooperative, alert, well developed, in no acute distress. ?HEENT: Conjunctivae and lids unremarkable. ?Cardiovascular: Regular rhythm.  ?Lungs: Normal work of breathing. ?Neurologic: No focal deficits.  ? ?Lab Results  ?Component Value Date  ? CREATININE 1.23 (H) 07/18/2021  ? BUN 24 07/18/2021  ? NA 144 07/18/2021  ? K 3.9 07/18/2021  ? CL 100 07/18/2021  ? CO2 29 07/18/2021  ? ?Lab Results  ?Component Value Date  ? ALT 21 07/18/2021  ? AST 21 07/18/2021  ? ALKPHOS 142 (H) 07/18/2021  ? BILITOT 0.3 07/18/2021  ? ?Lab Results  ?Component Value Date  ? HGBA1C 4.9 07/18/2021  ? HGBA1C 5.3  12/30/2020  ? ?Lab Results  ?Component Value Date  ? INSULIN 7.7 07/18/2021  ? INSULIN 13.9 12/30/2020  ? ?Lab Results  ?Component Value Date  ? TSH 1.020 12/30/2020  ? ?Lab Results  ?Component Value Date  ? CHOL 251 (H) 07/18/2021  ? HDL 88 07/18/2021  ? [place] 150 (H) 07/18/2021  ? TRIG 78 07/18/2021  ? CHOLHDL 2.3 05/14/2019  ? ?Lab Results  ?Component Value  Date  ? VD25OH 35.6 07/18/2021  ? VD25OH 31.3 12/30/2020  ? ?Lab Results  ?Component Value Date  ? WBC 8.8 12/30/2020  ? HGB 12.6 12/30/2020  ? HCT 38.4 12/30/2020  ? MCV 86 12/30/2020  ? PLT 302 12/30/2020  ? ?No results found for: IRON, TIBC, FERRITIN ? ?Attestation Statements:  ? ?Reviewed by clinician on day of visit: allergies, medications, problem list, medical history, surgical history, family history, social history, and previous encounter notes. ? ?I, Lizbeth Bark, RMA, am acting as transcriptionist for [employer], DO. ? ?I have reviewed the above documentation for accuracy and completeness, and I agree with the above. Jearld Lesch, DO ? ?

## 2021-09-12 ENCOUNTER — Encounter (INDEPENDENT_AMBULATORY_CARE_PROVIDER_SITE_OTHER): Payer: Self-pay | Admitting: Bariatrics

## 2021-10-03 ENCOUNTER — Encounter (INDEPENDENT_AMBULATORY_CARE_PROVIDER_SITE_OTHER): Payer: Self-pay | Admitting: Bariatrics

## 2021-10-03 ENCOUNTER — Other Ambulatory Visit (INDEPENDENT_AMBULATORY_CARE_PROVIDER_SITE_OTHER): Payer: Self-pay | Admitting: Bariatrics

## 2021-10-03 ENCOUNTER — Ambulatory Visit (INDEPENDENT_AMBULATORY_CARE_PROVIDER_SITE_OTHER): Payer: 59 | Admitting: Bariatrics

## 2021-10-03 VITALS — BP 139/80 | HR 64 | Temp 98.1°F | Ht 66.0 in | Wt 285.0 lb

## 2021-10-03 DIAGNOSIS — E669 Obesity, unspecified: Secondary | ICD-10-CM | POA: Diagnosis not present

## 2021-10-03 DIAGNOSIS — I10 Essential (primary) hypertension: Secondary | ICD-10-CM | POA: Diagnosis not present

## 2021-10-03 DIAGNOSIS — R632 Polyphagia: Secondary | ICD-10-CM

## 2021-10-03 DIAGNOSIS — Z6841 Body Mass Index (BMI) 40.0 and over, adult: Secondary | ICD-10-CM | POA: Diagnosis not present

## 2021-10-03 MED ORDER — TIRZEPATIDE 15 MG/0.5ML ~~LOC~~ SOAJ: 15.0000 mg | SUBCUTANEOUS | 0 refills | Status: DC

## 2021-10-04 ENCOUNTER — Encounter (INDEPENDENT_AMBULATORY_CARE_PROVIDER_SITE_OTHER): Payer: Self-pay

## 2021-10-04 NOTE — Telephone Encounter (Signed)
Last OV with Dr Brown 

## 2021-10-13 ENCOUNTER — Encounter (INDEPENDENT_AMBULATORY_CARE_PROVIDER_SITE_OTHER): Payer: Self-pay | Admitting: Bariatrics

## 2021-10-13 NOTE — Progress Notes (Signed)
? ? ? ?Chief Complaint:  ? ?OBESITY ?Kimberly Castro is here to discuss her progress with her obesity treatment plan along with follow-up of her obesity related diagnoses. Kimberly Castro is on the Category 3 Plan and states she is following her eating plan approximately 90% of the time. Kimberly Castro states she is doing 0 minutes 0 times per week. ? ?Today's visit was #: 14 ?Starting weight: 344 lbs ?Starting date: 12/30/2020 ?Today's weight: 285 lbs ?Today's date: 10/03/2021 ?Total lbs lost to date: 59 lbs ?Total lbs lost since last in-office visit: 8 lbs ? ?Interim History: Kimberly Castro is down an additional 8 lbs and doing well overall. She is getting adequate protein.  ? ?Subjective:  ? ?1. Polyphagia ?Kimberly Castro states Kimberly Castro is helping with appetite. She denies nausea.  ? ?2. Essential hypertension ?Kimberly Castro  has a headache today and her blood pressure is slightly elevated.  Her blood pressure is typicallywell controlled. Her last blood pressure was recorded at 126/61. ? ?Assessment/Plan:  ? ?1. Polyphagia ?Intensive lifestyle modifications are the first line treatment for this issue. We will refill Mounjaro 15 mg for 1 month with no refills. We discussed several lifestyle modifications today and she will continue to work on diet, exercise and weight loss efforts. Orders and follow up as documented in patient record. ? ?Counseling ?Polyphagia is excessive hunger. ?Causes can include: low blood sugars, hypERthyroidism, PMS, lack of sleep, stress, insulin resistance, diabetes, certain medications, and diets that are deficient in protein and fiber.   ? ?- tirzepatide (MOUNJARO) 15 MG/0.5ML Pen; Inject 15 mg into the skin once a week.  Dispense: 2 mL; Refill: 0 ? ?2. Essential hypertension ?Kimberly Castro will continue her medications. She is working on healthy weight loss and exercise to improve blood pressure control. We will watch for signs of hypotension as she continues her lifestyle modifications. ? ?3. Obesity with current BMI of 46.1 ?Kimberly Castro is currently in the  action stage of change. As such, her goal is to continue with weight loss efforts. She has agreed to the Category 3 Plan.  ? ?Kimberly Castro will continue intentional eating. She will continue to adhere closely to her plan 80-90%. She will increase her water intake.  ? ?Exercise goals: No exercise has been prescribed at this time. ? ?Behavioral modification strategies: increasing lean protein intake, decreasing simple carbohydrates, increasing vegetables, increasing water intake, decreasing eating out, no skipping meals, meal planning and cooking strategies, keeping healthy foods in the home, and planning for success. ? ?Kimberly Castro has agreed to follow-up with our clinic in 4 weeks with nurse practitioner and 8 weeks with myself. She was informed of the importance of frequent follow-up visits to maximize her success with intensive lifestyle modifications for her multiple health conditions.  ? ?Objective:  ? ?Blood pressure 139/80, pulse 64, temperature 98.1 ?F (36.7 ?C), height 5\' 6"  (1.676 m), weight 285 lb (129.3 kg), SpO2 98 %. ?Body mass index is 46 kg/m?. ? ?General: Cooperative, alert, well developed, in no acute distress. ?HEENT: Conjunctivae and lids unremarkable. ?Cardiovascular: Regular rhythm.  ?Lungs: Normal work of breathing. ?Neurologic: No focal deficits.  ? ?Lab Results  ?Component Value Date  ? CREATININE 1.23 (H) 07/18/2021  ? BUN 24 07/18/2021  ? NA 144 07/18/2021  ? K 3.9 07/18/2021  ? CL 100 07/18/2021  ? CO2 29 07/18/2021  ? ?Lab Results  ?Component Value Date  ? ALT 21 07/18/2021  ? AST 21 07/18/2021  ? ALKPHOS 142 (H) 07/18/2021  ? BILITOT 0.3 07/18/2021  ? ?Lab Results  ?  Component Value Date  ? HGBA1C 4.9 07/18/2021  ? HGBA1C 5.3 12/30/2020  ? ?Lab Results  ?Component Value Date  ? INSULIN 7.7 07/18/2021  ? INSULIN 13.9 12/30/2020  ? ?Lab Results  ?Component Value Date  ? TSH 1.020 12/30/2020  ? ?Lab Results  ?Component Value Date  ? CHOL 251 (H) 07/18/2021  ? HDL 88 07/18/2021  ? LDLCALC 150 (H) 07/18/2021   ? TRIG 78 07/18/2021  ? CHOLHDL 2.3 05/14/2019  ? ?Lab Results  ?Component Value Date  ? VD25OH 35.6 07/18/2021  ? VD25OH 31.3 12/30/2020  ? ?Lab Results  ?Component Value Date  ? WBC 8.8 12/30/2020  ? HGB 12.6 12/30/2020  ? HCT 38.4 12/30/2020  ? MCV 86 12/30/2020  ? PLT 302 12/30/2020  ? ?No results found for: IRON, TIBC, FERRITIN ? ?Attestation Statements:  ? ?Reviewed by clinician on day of visit: allergies, medications, problem list, medical history, surgical history, family history, social history, and previous encounter notes. ? ?I, Jackson Latino, RMA, am acting as transcriptionist for [employer], DO. ? ?I have reviewed the above documentation for accuracy and completeness, and I agree with the above. Corinna Capra, DO ? ?

## 2021-10-27 ENCOUNTER — Other Ambulatory Visit (HOSPITAL_COMMUNITY): Payer: Self-pay

## 2021-10-27 MED ORDER — AMPHETAMINE-DEXTROAMPHET ER 30 MG PO CP24
30.0000 mg | ORAL_CAPSULE | Freq: Every day | ORAL | 0 refills | Status: DC
  Filled 2021-10-27: qty 30, 30d supply, fill #0

## 2021-10-31 ENCOUNTER — Encounter (INDEPENDENT_AMBULATORY_CARE_PROVIDER_SITE_OTHER): Payer: Self-pay | Admitting: Adult Health

## 2021-10-31 ENCOUNTER — Ambulatory Visit (INDEPENDENT_AMBULATORY_CARE_PROVIDER_SITE_OTHER): Payer: 59 | Admitting: Adult Health

## 2021-10-31 VITALS — BP 127/79 | HR 64 | Temp 98.0°F | Ht 66.0 in | Wt 281.0 lb

## 2021-10-31 DIAGNOSIS — Z6841 Body Mass Index (BMI) 40.0 and over, adult: Secondary | ICD-10-CM | POA: Diagnosis not present

## 2021-10-31 DIAGNOSIS — R632 Polyphagia: Secondary | ICD-10-CM

## 2021-10-31 DIAGNOSIS — E669 Obesity, unspecified: Secondary | ICD-10-CM | POA: Diagnosis not present

## 2021-10-31 DIAGNOSIS — Z9189 Other specified personal risk factors, not elsewhere classified: Secondary | ICD-10-CM

## 2021-10-31 MED ORDER — TIRZEPATIDE 15 MG/0.5ML ~~LOC~~ SOAJ: 15.0000 mg | SUBCUTANEOUS | 0 refills | Status: DC

## 2021-11-01 NOTE — Progress Notes (Signed)
Chief Complaint:   OBESITY Kimberly Castro is here to discuss her progress with her obesity treatment plan along with follow-up of her obesity related diagnoses. Kimberly Castro is on the Category 3 Plan and states she is following her eating plan approximately 80% of the time. Kimberly Castro states she is doing 0 minutes 0 times per week.  Today's visit was #: 15 Starting weight: 344 lbs Starting date: 12/30/2020 Today's weight: 281 lbs Today's date: 10/31/2021 Total lbs lost to date: 63 lbs Total lbs lost since last in-office visit: 4 lbs  Interim History:  Kimberly Castro was started on Mounjaro 2.5 mg once a week on 01/13/2021.  She is currently on max dose 15 mg once a week.  She injects on Sundays. She estimates to consume [phone] calories per day.  She states "I thought I should keep my calorie intake as low as possible".  Of note: She had Bariatric surgery in 2000.   Her ultimate goal is < 200 lbs. Current weight 281 lbs.  Subjective:   1. Polyphagia Kimberly Castro was started on Mounjaro 2.5 mg once a week on 01/13/2021.  She is currently on max dose 15 mg once a week.  She injects on Sundays.  2. At risk for deficient intake of food Kimberly Castro is at a higher than average risk of deficient intake of food due to not eating enough.   Assessment/Plan:   1. Polyphagia We will refill Mounjaro 15 mg for 1 month with no refills.   - tirzepatide (MOUNJARO) 15 MG/0.5ML Pen; Inject 15 mg into the skin once a week.  Dispense: 2 mL; Refill: 0  2. At risk for deficient intake of food Kimberly Castro was given approximately 15 minutes of deficient intake of food prevention counseling today. Kimberly Castro is at risk for eating too few calories based on current food recall. She was encouraged to focus on meeting caloric and protein goals according to her recommended meal plan.   3. Obesity with current BMI of 45.4 Kimberly Castro is currently in the action stage of change. As such, her goal is to continue with weight loss efforts. She has agreed to the Category  3 Plan.   We will check fasting labs and IC at next office visit. Pt to arrive prior to scheduled OV and to be fasting.  Exercise goals:  Kimberly Castro will increase daily walking.   Behavioral modification strategies: increasing lean protein intake, decreasing simple carbohydrates, meal planning and cooking strategies, keeping healthy foods in the home, and planning for success.  Kimberly Castro has agreed to follow-up with our clinic in 3-4 weeks. She was informed of the importance of frequent follow-up visits to maximize her success with intensive lifestyle modifications for her multiple health conditions.   Objective:   Blood pressure 127/79, pulse 64, temperature 98 F (36.7 C), height 5\' 6"  (1.676 m), weight 281 lb (127.5 kg), SpO2 98 %. Body mass index is 45.35 kg/m.  General: Cooperative, alert, well developed, in no acute distress. HEENT: Conjunctivae and lids unremarkable. Cardiovascular: Regular rhythm.  Lungs: Normal work of breathing. Neurologic: No focal deficits.   Lab Results  Component Value Date   CREATININE 1.23 (H) 07/18/2021   BUN 24 07/18/2021   NA 144 07/18/2021   K 3.9 07/18/2021   CL 100 07/18/2021   CO2 29 07/18/2021   Lab Results  Component Value Date   ALT 21 07/18/2021   AST 21 07/18/2021   ALKPHOS 142 (H) 07/18/2021   BILITOT 0.3 07/18/2021   Lab Results  Component Value Date   HGBA1C 4.9 07/18/2021   HGBA1C 5.3 12/30/2020   Lab Results  Component Value Date   INSULIN 7.7 07/18/2021   INSULIN 13.9 12/30/2020   Lab Results  Component Value Date   TSH 1.020 12/30/2020   Lab Results  Component Value Date   CHOL 251 (H) 07/18/2021   HDL 88 07/18/2021   LDLCALC 150 (H) 07/18/2021   TRIG 78 07/18/2021   CHOLHDL 2.3 05/14/2019   Lab Results  Component Value Date   VD25OH 35.6 07/18/2021   VD25OH 31.3 12/30/2020   Lab Results  Component Value Date   WBC 8.8 12/30/2020   HGB 12.6 12/30/2020   HCT 38.4 12/30/2020   MCV 86 12/30/2020   PLT  302 12/30/2020   No results found for: IRON, TIBC, FERRITIN  Attestation Statements:   Reviewed by clinician on day of visit: allergies, medications, problem list, medical history, surgical history, family history, social history, and previous encounter notes.  I, Jackson Latino, RMA, am acting as Energy manager for William Hamburger, NP.  I have reviewed the above documentation for accuracy and completeness, and I agree with the above. -  Caelin Rayl d. Niklas Chretien, NP-C

## 2021-11-07 ENCOUNTER — Telehealth (INDEPENDENT_AMBULATORY_CARE_PROVIDER_SITE_OTHER): Payer: Self-pay | Admitting: Adult Health

## 2021-11-07 ENCOUNTER — Encounter (INDEPENDENT_AMBULATORY_CARE_PROVIDER_SITE_OTHER): Payer: Self-pay

## 2021-11-07 NOTE — Telephone Encounter (Signed)
Kimberly Castro - Prior authorization denied for Mounjaro. Patient already uses copay card. Patient sent denial message via mychart.  ?

## 2021-11-24 ENCOUNTER — Other Ambulatory Visit (HOSPITAL_COMMUNITY): Payer: Self-pay

## 2021-11-24 ENCOUNTER — Encounter: Payer: Self-pay | Admitting: *Deleted

## 2021-11-24 MED ORDER — AMPHETAMINE-DEXTROAMPHET ER 30 MG PO CP24
30.0000 mg | ORAL_CAPSULE | Freq: Every morning | ORAL | 0 refills | Status: DC
  Filled 2021-11-24: qty 30, 30d supply, fill #0

## 2021-11-24 MED ORDER — AMPHETAMINE-DEXTROAMPHET ER 30 MG PO CP24: 30.0000 mg | ORAL_CAPSULE | Freq: Every morning | ORAL | 0 refills | Status: AC

## 2021-11-24 MED ORDER — AMPHETAMINE-DEXTROAMPHET ER 30 MG PO CP24: 30.0000 mg | ORAL_CAPSULE | Freq: Every morning | ORAL | 0 refills | Status: DC

## 2021-11-24 NOTE — Progress Notes (Signed)
Referring:  Osborn Coho, MD [address] [place],  [place] [address]  PCP: Sigmund Hazel, MD  Neurology was asked to evaluate Kimberly Castro, a 60 year old female for a chief complaint of headaches.  Our recommendations of care will be communicated by shared medical record.    CC:  headaches  History provided from self  HPI:  Medical co-morbidities: HTN, OSA, B12 deficiency, anxiety, ADHD  The patient presents for evaluation of headaches which began two years ago. No clear inciting events for onset of headaches, though she notes a lot of stress 3 years ago when her mother and brother unfortunately passed away within 3 months of each other. Currently she has 1-2 lower level headaches per week with one severe headache per month. Severe headaches are described as bilateral occipital pounding with associated photophobia, phonophobia, and nausea. They can last up to 6 days at a time. Uses Tylenol as needed but this typically does not help.  Headache History: Onset: 2 years ago Triggers: none Aura: none Location: bilateral occipital Quality/Description: pounding Associated Symptoms:  Photophobia: yes  Phonophobia: yes  Nausea: yes Worse with activity?: yes Duration of headaches: 6 days  Headache days per month: 8 Headache free days per month: 22  Current Treatment: Abortive Tylenol  Preventative none  Prior Therapies                                 Metoprolol 25 BID Effexor 225 mg daily Tramadol Tylenol    LABS: CBC    Component Value Date/Time   WBC 8.8 12/30/2020 0930   WBC 15.1 (H) 06/24/2019 0621   RBC 4.47 12/30/2020 0930   RBC 3.82 (L) 06/24/2019 0621   HGB 12.6 12/30/2020 0930   HCT 38.4 12/30/2020 0930   PLT 302 12/30/2020 0930   MCV 86 12/30/2020 0930   MCH 28.2 12/30/2020 0930   MCH 29.6 06/24/2019 0621   MCHC 32.8 12/30/2020 0930   MCHC 31.8 06/24/2019 0621   RDW 14.4 12/30/2020 0930   LYMPHSABS 2.6 12/30/2020 0930   MONOABS  0.8 08/12/2016 0429   EOSABS 0.2 12/30/2020 0930   BASOSABS 0.1 12/30/2020 0930      Latest Ref Rng & Units 07/18/2021    8:29 AM 12/30/2020    9:30 AM 06/24/2019   12:31 PM  CMP  Glucose 70 - 99 mg/dL 74  80  353   BUN 8 - 27 mg/dL 24  18  19    Creatinine 0.57 - 1.00 mg/dL  2.99  2.42   Sodium 134 - 144 mmol/L 144  145  139   Potassium 3.5 - 5.2 mmol/L 3.9  4.3  3.5   Chloride 96 - 106 mmol/L 100  102  104   CO2 20 - 29 mmol/L 29  28  24    Calcium 8.7 - 10.3 mg/dL 9.5  9.2  8.1   Total Protein 6.0 - 8.5 g/dL 6.9  6.8    Total Bilirubin 0.0 - 1.2 mg/dL 0.3  0.3    Alkaline Phos 44 - 121 IU/L 142  175    AST 0 - 40 IU/L 21  38    ALT 0 - 32 IU/L 21  35      IMAGING:  none   Current Outpatient Medications on File Prior to Visit  Medication Sig Dispense Refill   [START ON 01/21/2022] amphetamine-dextroamphetamine (ADDERALL XR) 30 MG 24 hr capsule  Take 1 capsule (30 mg total) by mouth in the morning. (01-21-22) 30 capsule 0   amphetamine-dextroamphetamine (ADDERALL XR) 30 MG 24 hr capsule Take 1 capsule (30 mg total) by mouth in the morning. 30 capsule 0   [START ON 12/22/2021] amphetamine-dextroamphetamine (ADDERALL XR) 30 MG 24 hr capsule Take 1 capsule (30 mg total) by mouth in the morning. 30 capsule 0   buPROPion (WELLBUTRIN XL) 300 MG 24 hr tablet TAKE 1 TABLET(300 MG) BY MOUTH DAILY 90 tablet 1   cetirizine (ZYRTEC) 10 MG tablet Take 10 mg by mouth daily.     estradiol (ESTRACE) 0.1 MG/GM vaginal cream Place 1 Applicatorful vaginally at bedtime.     fluticasone (FLONASE) 50 MCG/ACT nasal spray Place 1 spray into both nostrils daily.   1   hydrochlorothiazide (HYDRODIURIL) 12.5 MG tablet TAKE 1 TABLET(12.5 MG) BY MOUTH DAILY 60 tablet 3   hydrOXYzine (ATARAX/VISTARIL) 10 MG tablet Take 1-3 tablets (10-30 mg total) by mouth 3 (three) times daily as needed. 60 tablet 1   metoprolol tartrate (LOPRESSOR) 25 MG tablet Take 1 tablet (25 mg total) by mouth 2 (two) times daily. 180  tablet 3   tirzepatide (MOUNJARO) 15 MG/0.5ML Pen Inject 15 mg into the skin once a week. 2 mL 0   Venlafaxine HCl 225 MG TB24 Take 225 mg by mouth daily with breakfast.   1   zolpidem (AMBIEN) 10 MG tablet Take 10 mg by mouth at bedtime as needed for sleep.   0   No current facility-administered medications on file prior to visit.     Allergies: Allergies  Allergen Reactions   Atorvastatin Calcium     Other reaction(s): myalgia    Family History: Migraine or other headaches in the family:  no Aneurysms in a first degree relative:  father had an aneurysm Brain tumors in the family:  no Other neurological illness in the family:   older brother had a stroke (age 60), sister had a stroke (age 60)  Past Medical History: Past Medical History:  Diagnosis Date   ADD (attention deficit disorder)    Anemia    Anxiety    B12 deficiency    Back pain    CHF (congestive heart failure) (HCC)    Chronic fatigue    CKD (chronic kidney disease), stage III (HCC)    Complication of anesthesia    during colonoscopy BP dropped very low   Constipation    Depression    Depression    Fatigue 06/10/2020   Fatty liver    Fibromyalgia    Headache    Heart murmur    no problems with it   Hypercholesteremia    Hypertension    Hypoglycemia    IBS (irritable bowel syndrome)    IBS (irritable bowel syndrome)    Osteopenia    Palpitations 03/31/2016   anxiety related   Sleep apnea    uses cpap   SOB (shortness of breath)     Past Surgical History Past Surgical History:  Procedure Laterality Date   ANTERIOR AND POSTERIOR REPAIR N/A 06/23/2019   Procedure: ANTERIOR (CYSTOCELE) AND POSTERIOR REPAIR (RECTOCELE);  Surgeon: Osborn Cohooberts, Angela, MD;  [facility];  Service: Gynecology;  Laterality: N/A;  3 hours   BLADDER SUSPENSION N/A 06/23/2019   Procedure: TRANSVAGINAL TAPE (TVT) PROCEDURE;  Surgeon: Osborn Cohooberts, Angela, MD;  [facility];  Service: Gynecology;  Laterality: N/A;   COLONOSCOPY      GASTRIC BYPASS     LAPAROSCOPY N/A  08/12/2016   Procedure: LAPAROSCOPY DIAGNOSTIC, POSSIBLE LAPAROTOMY, POSSIBLE BOWEL RESECTION;  Surgeon: Glenna Fellows, MD;  Location: [facility] OR;  Service: General;  Laterality: N/A;   PARTIAL HYSTERECTOMY     still has ovaries   TONSILLECTOMY     WISDOM TOOTH EXTRACTION      Social History: Social History   Tobacco Use   Smoking status: Former   Smokeless tobacco: Never   Tobacco comments:    quit 1990  Vaping Use   Vaping Use: Never used  Substance Use Topics   Alcohol use: Yes    Comment: very rare   Drug use: No     ROS: Negative for fevers, chills. Positive for headaches. All other systems reviewed and negative unless stated otherwise in HPI.   Physical Exam:   Vital Signs: BP 123/68   Pulse 72   Ht 5\' 6"  (1.676 m)   Wt 280 lb (127 kg)   BMI 45.19 kg/m  GENERAL: well appearing,in no acute distress,alert SKIN:  Color, texture, turgor normal. No rashes or lesions HEAD:  Normocephalic/atraumatic. CV:  RRR RESP: Normal respiratory effort MSK: no tenderness to palpation over occiput, neck, or shoulders  NEUROLOGICAL: Mental Status: Alert, oriented to person, place and time,Follows commands Cranial Nerves: PERRL, visual fields intact to confrontation, extraocular movements intact, facial sensation intact, no facial droop or ptosis, hearing grossly intact, no dysarthria Motor: muscle strength 5/5 both upper and lower extremities Reflexes: 2+ throughout Sensation: intact to light touch all 4 extremities Coordination: Finger-to- nose-finger intact bilaterally Gait: normal-based   IMPRESSION: 61 year old female with a history of HTN, OSA, B12 deficiency, anxiety, ADHD who presents for evaluation of headaches. Her headache pattern is most consistent with migraine without aura. Will start Maxalt for migraine rescue. She is not interested in a daily preventive at this time. CTA head ordered for aneurysm screening as she reports a  first degree relative with aneurysm.  PLAN: -CTA head -Rescue: Start Maxalt 10 mg PRN   I spent a total of 24 minutes chart reviewing and counseling the patient. Headache education was done. Discussed treatment options including acute medications and physical therapy. Discussed medication overuse headache and to limit use of acute treatments to no more than 2 days/week or 10 days/month. Discussed medication side effects, adverse reactions and drug interactions. Written educational materials and patient instructions outlining all of the above were given.  Follow-up: 5 months   68, MD 11/25/2021   8:46 AM

## 2021-11-25 ENCOUNTER — Ambulatory Visit: Payer: 59 | Admitting: Psychiatry

## 2021-11-25 ENCOUNTER — Encounter: Payer: Self-pay | Admitting: Psychiatry

## 2021-11-25 ENCOUNTER — Telehealth: Payer: Self-pay | Admitting: Psychiatry

## 2021-11-25 VITALS — BP 123/68 | HR 72 | Ht 66.0 in | Wt 280.0 lb

## 2021-11-25 DIAGNOSIS — G43009 Migraine without aura, not intractable, without status migrainosus: Secondary | ICD-10-CM

## 2021-11-25 DIAGNOSIS — Z8249 Family history of ischemic heart disease and other diseases of the circulatory system: Secondary | ICD-10-CM

## 2021-11-25 MED ORDER — RIZATRIPTAN BENZOATE 10 MG PO TABS: 10.0000 mg | ORAL_TABLET | ORAL | 6 refills | Status: DC | PRN

## 2021-11-25 NOTE — Patient Instructions (Signed)
Start rizatriptan (Maxalt) as needed for migraine. Take at the onset of migraine. If headache recurs or does not fully resolve, you may take a second dose after 2 hours. Please avoid taking more than 2 days per week or 10 days per month. 2. CT scan of blood vessels in the brain  Natural supplements: Magnesium Oxide or Magnesium Glycinate 500 mg at bed (up to 800 mg daily) Coenzyme Q10 300 mg in AM Vitamin B2- 200 mg twice a day  Add 1 supplement at a time since even natural supplements can have undesirable side effects. You can sometimes buy supplements cheaper (especially Coenzyme Q10) at www.WebmailGuide.co.zapuritan.com or at [employer].  Vitamins and herbs that show potential:  Magnesium: Magnesium (250 mg twice a day or 500 mg at bed) has a relaxant effect on smooth muscles such as blood vessels. Individuals suffering from frequent or daily headache usually have low magnesium levels which can be increase with daily supplementation of 400-750 mg. Three trials found 40-90% average headache reduction  when used as a preventative. Magnesium also demonstrated the benefit in menstrually related migraine.  Magnesium is part of the messenger system in the serotonin cascade and it is a good muscle relaxant.  It is also useful for constipation which can be a side effect of other medications used to treat migraine. Good sources include nuts, whole grains, and tomatoes. Side Effects: loose stool/diarrhea Riboflavin (vitamin B 2) 200 mg twice a day. This vitamin assists nerve cells in the production of ATP a principal energy storing molecule.  It is necessary for many chemical reactions in the body.  There have been at least 3 clinical trials of riboflavin using 400 mg per day all of which suggested that migraine frequency can be decreased.  All 3 trials showed significant improvement in over half of migraine sufferers.  The supplement is found in bread, cereal, milk, meat, and poultry.  Most Americans get more riboflavin than the  recommended daily allowance, however riboflavin deficiency is not necessary for the supplements to help prevent headache. Side effects: energizing, green urine  Coenzyme Q10: This is present in almost all cells in the body and is critical component for the conversion of energy.  Recent studies have shown that a nutritional supplement of CoQ10 can reduce the frequency of migraine attacks by improving the energy production of cells as with riboflavin.  Doses of 150 mg twice a day have been shown to be effective.  Melatonin: Increasing evidence shows correlation between melatonin secretion and headache conditions.  Melatonin supplementation has decreased headache intensity and duration.  It is widely used as a sleep aid.  Sleep is natures way of dealing with migraine.  A dose of 3 mg is recommended to start for headaches including cluster headache. Higher doses up to 15 mg has been reviewed for use in Cluster headache and have been used. The rationale behind using melatonin for cluster is that many theories regarding the cause of Cluster headache center around the disruption of the normal circadian rhythm in the brain.  This helps restore the normal circadian rhythm.  HEADACHE DIET: Foods and beverages which may trigger migraine Note that only 20% of headache patients are food sensitive. You will know if you are food sensitive if you get a headache consistently 20 minutes to 2 hours after eating a certain food. Only cut out a food if it causes headaches, otherwise you might remove foods you enjoy! What matters most for diet is to eat a well balanced healthy  diet full of vegetables and low fat protein, and to not miss meals.  Chocolate, other sweets ALL cheeses except cottage and cream cheese Dairy products, yogurt, sour cream, ice cream Liver Meat extracts (Bovril, Marmite, meat tenderizers) Meats or fish which have undergone aging, fermenting, pickling or smoking. These include:  Hotdogs,salami,Lox,sausage, mortadellas,smoked salmon, pepperoni, Pickled herring Pods of broad bean (English beans, Chinese pea pods, [place] (fava) beans, lima and navy beans Ripe avocado, ripe banana Yeast extracts or active yeast preparations such as Brewer's or Fleishman's (commercial bakes goods are permitted) Tomato based foods, pizza (lasagna, etc.)  MSG (monosodium glutamate) is disguised as many things; look for these common aliases: Monopotassium glutamate Autolysed yeast Hydrolysed protein Sodium caseinate "flavorings" "all natural preservatives" Nutrasweet  Avoid all other foods that convincingly provoke headaches.  Resources: The Dizzy [place] Laundry Your Headache Diet, migrainestrong.com  https://zamora-andrews.com/  Caffeine and Migraine For patients that have migraine, caffeine intake more than 3 days per week can lead to dependency and increased migraine frequency. I would recommend cutting back on your caffeine intake as best you can. The recommended amount of caffeine is 200-300 mg daily, although migraine patients may experience dependency at even lower doses. While you may notice an increase in headache temporarily, cutting back will be helpful for headaches in the long run. For more information on caffeine and migraine, visit: https://americanmigrainefoundation.org/resource-library/caffeine-and-migraine/  Headache Prevention Strategies:  1. Maintain a headache diary; learn to identify and avoid triggers.  - This can be a simple note where you log when you had a headache, associated symptoms, and medications used - There are several smartphone apps developed to help track migraines: Migraine Buddy, Migraine Monitor, Curelator N1-Headache App  Common triggers include: Emotional triggers: Emotional/Upset family or friends Emotional/Upset occupation Business reversal/success Anticipation  anxiety Crisis-serious Post-crisis periodNew job/position   Physical triggers: Vacation Day Weekend Strenuous Exercise High Altitude Location New Move Menstrual Day Physical Illness Oversleep/Not enough sleep Weather changes Light: Photophobia or light sesnitivity treatment involves a balance between desensitization and reduction in overly strong input. Use dark polarized glasses outside, but not inside. Avoid bright or fluorescent light, but do not dim environment to the point that going into a normally lit room hurts. Consider FL-41 tint lenses, which reduce the most irritating wavelengths without blocking too much light.  These can be obtained at axonoptics.com or theraspecs.com Foods: see list above.  2. Limit use of acute treatments (over-the-counter medications, triptans, etc.) to no more than 2 days per week or 10 days per month to prevent medication overuse headache (rebound headache).    3. Follow a regular schedule (including weekends and holidays): Don't skip meals. Eat a balanced diet. 8 hours of sleep nightly. Minimize stress. Exercise 30 minutes per day. Being overweight is associated with a 5 times increased risk of chronic migraine. Keep well hydrated and drink 6-8 glasses of water per day.  4. Initiate non-pharmacologic measures at the earliest onset of your headache. Rest and quiet environment. Relax and reduce stress. Breathe2Relax is a free app that can instruct you on    some simple relaxtion and breathing techniques. Http://Dawnbuse.com is a    free website that provides teaching videos on relaxation.  Also, there are  many apps that   can be downloaded for "mindful" relaxation.  An app called YOGA NIDRA will help walk you through mindfulness. Another app called Calm can be downloaded to give you a structured mindfulness guide with daily reminders and skill development. Headspace for guided meditation Mindfulness Based  Stress Reduction Online Course:  www.palousemindfulness.com Cold compresses.  5. Don't wait!! Take the maximum allowable dosage of prescribed medication at the first sign of migraine.  6. Compliance:  Take prescribed medication regularly as directed and at the first sign of a migraine.  7. Communicate:  Call your physician when problems arise, especially if your headaches change, increase in frequency/severity, or become associated with neurological symptoms (weakness, numbness, slurred speech, etc.).  8. Headache/pain management therapies: Consider various complementary methods, including medication, behavioral therapy, psychological counselling, biofeedback, massage therapy, acupuncture, dry needling, and other modalities.  Such measures may reduce the need for medications. Counseling for pain management, where patients learn to function and ignore/minimize their pain, seems to work very well.  9. Recommend changing family's attention and focus away from patient's headaches. Instead, emphasize daily activities. If first question of day is 'How are your headaches/Do you have a headache today?', then patient will constantly think about headaches, thus making them worse. Goal is to re-direct attention away from headaches, toward daily activities and other distractions.  10. Helpful Websites: www.AmericanHeadacheSociety.org PatentHood.ch www.headaches.org TightMarket.nl www.achenet.org

## 2021-11-25 NOTE — Telephone Encounter (Signed)
[facility] NPR case #[id number] sent to GI they will call the patient to schedule

## 2021-11-28 ENCOUNTER — Ambulatory Visit (INDEPENDENT_AMBULATORY_CARE_PROVIDER_SITE_OTHER): Payer: 59 | Admitting: Bariatrics

## 2021-12-01 ENCOUNTER — Ambulatory Visit (INDEPENDENT_AMBULATORY_CARE_PROVIDER_SITE_OTHER): Payer: 59 | Admitting: Bariatrics

## 2021-12-01 ENCOUNTER — Encounter (INDEPENDENT_AMBULATORY_CARE_PROVIDER_SITE_OTHER): Payer: Self-pay | Admitting: Bariatrics

## 2021-12-01 VITALS — BP 142/79 | HR 69 | Temp 98.0°F | Ht 66.0 in | Wt 275.0 lb

## 2021-12-01 DIAGNOSIS — R632 Polyphagia: Secondary | ICD-10-CM | POA: Diagnosis not present

## 2021-12-01 DIAGNOSIS — I10 Essential (primary) hypertension: Secondary | ICD-10-CM

## 2021-12-01 DIAGNOSIS — L659 Nonscarring hair loss, unspecified: Secondary | ICD-10-CM | POA: Diagnosis not present

## 2021-12-01 DIAGNOSIS — E669 Obesity, unspecified: Secondary | ICD-10-CM | POA: Diagnosis not present

## 2021-12-01 DIAGNOSIS — Z7985 Long-term (current) use of injectable non-insulin antidiabetic drugs: Secondary | ICD-10-CM

## 2021-12-01 DIAGNOSIS — Z6841 Body Mass Index (BMI) 40.0 and over, adult: Secondary | ICD-10-CM

## 2021-12-01 MED ORDER — TIRZEPATIDE 15 MG/0.5ML ~~LOC~~ SOAJ
15.0000 mg | SUBCUTANEOUS | 0 refills | Status: DC
  Filled 2022-02-13 (×2): qty 2, 28d supply, fill #0

## 2021-12-01 NOTE — Progress Notes (Unsigned)
e

## 2021-12-02 ENCOUNTER — Encounter: Payer: Self-pay | Admitting: Psychiatry

## 2021-12-05 NOTE — Progress Notes (Unsigned)
Chief Complaint:   OBESITY Kimberly Castro is here to discuss her progress with her obesity treatment plan along with follow-up of her obesity related diagnoses. Kimberly Castro is on the Category 3 Plan and states she is following her eating plan approximately 65% of the time. Kimberly Castro states she is doing 0 minutes 0 times per week.  Today's visit was #: 16 Starting weight: 344 lbs Starting date: 12/30/2020 Today's weight: 275 lbs Today's date: 12/01/2021 Total lbs lost to date: 69 Total lbs lost since last in-office visit: 6  Interim History: Kimberly Castro is down another 6 lbs and she is doing well overall.   Subjective:   1. Polyphagia Kimberly Castro is taking Mounjaro 15 mg once weekly.   2. Essential hypertension Kimberly Castro is taking HCTZ, and Lopressor and her blood pressure is controlled.   3. Thinning hair Kimberly Castro uses [place] for hair thickening.   Assessment/Plan:   1. Polyphagia We will refill Mounjaro 15 mg q week for 1 month. Kimberly Castro will decrease carbohydrates.  - tirzepatide (MOUNJARO) 15 MG/0.5ML Pen; Inject 15 mg into the skin once a week.  Dispense: 6 mL; Refill: 0  2. Essential hypertension Kimberly Castro will continue her medications, and  we will follow up at her next visit.  3. Thinning hair Kimberly Castro will consider seeing the dermatologist. She will consider spironolactone, minoxide, and continue Rosemary  oil.   4. Obesity with current BMI of 44.4 Kimberly Castro is currently in the action stage of change. As such, her goal is to continue with weight loss efforts. She has agreed to the Category 3 Plan.   Low carb snacks, "make your life easier" handouts were given.  Exercise goals: No exercise has been prescribed at this time.  Behavioral modification strategies: increasing lean protein intake, decreasing simple carbohydrates, increasing vegetables, increasing water intake, decreasing eating out, no skipping meals, meal planning and cooking strategies, keeping healthy foods in the home, and planning for success.  Kimberly Castro  has agreed to follow-up with our clinic in 2 weeks. She was informed of the importance of frequent follow-up visits to maximize her success with intensive lifestyle modifications for her multiple health conditions.   Objective:   Blood pressure (!) 142/79, pulse 69, temperature 98 F (36.7 C), height 5\' 6"  (1.676 m), weight 275 lb (124.7 kg), SpO2 96 %. Body mass index is 44.39 kg/m.  General: Cooperative, alert, well developed, in no acute distress. HEENT: Conjunctivae and lids unremarkable. Cardiovascular: Regular rhythm.  Lungs: Normal work of breathing. Neurologic: No focal deficits.   Lab Results  Component Value Date   CREATININE 1.23 (H) 07/18/2021   BUN 24 07/18/2021   NA 144 07/18/2021   K 3.9 07/18/2021   CL 100 07/18/2021   CO2 29 07/18/2021   Lab Results  Component Value Date   ALT 21 07/18/2021   AST 21 07/18/2021   ALKPHOS 142 (H) 07/18/2021   BILITOT 0.3 07/18/2021   Lab Results  Component Value Date   HGBA1C 4.9 07/18/2021   HGBA1C 5.3 12/30/2020   Lab Results  Component Value Date   INSULIN 7.7 07/18/2021   INSULIN 13.9 12/30/2020   Lab Results  Component Value Date   TSH 1.020 12/30/2020   Lab Results  Component Value Date   CHOL 251 (H) 07/18/2021   HDL 88 07/18/2021   LDLCALC 150 (H) 07/18/2021   TRIG 78 07/18/2021   CHOLHDL 2.3 05/14/2019   Lab Results  Component Value Date   VD25OH 35.6 07/18/2021   VD25OH 31.3 12/30/2020  Lab Results  Component Value Date   WBC 8.8 12/30/2020   HGB 12.6 12/30/2020   HCT 38.4 12/30/2020   MCV 86 12/30/2020   PLT 302 12/30/2020   No results found for: "IRON", "TIBC", "FERRITIN"  Attestation Statements:   Reviewed by clinician on day of visit: allergies, medications, problem list, medical history, surgical history, family history, social history, and previous encounter notes.   Trude Mcburney, am acting as Energy manager for [employer], DO.  I have reviewed the above documentation  for accuracy and completeness, and I agree with the above. Corinna Capra, DO

## 2021-12-06 ENCOUNTER — Encounter (INDEPENDENT_AMBULATORY_CARE_PROVIDER_SITE_OTHER): Payer: Self-pay | Admitting: Bariatrics

## 2021-12-06 ENCOUNTER — Encounter (INDEPENDENT_AMBULATORY_CARE_PROVIDER_SITE_OTHER): Payer: Self-pay

## 2021-12-06 ENCOUNTER — Telehealth (INDEPENDENT_AMBULATORY_CARE_PROVIDER_SITE_OTHER): Payer: Self-pay | Admitting: Bariatrics

## 2021-12-06 NOTE — Telephone Encounter (Signed)
Dr. Brown - Prior authorization denied for Mounjaro. Patient already uses copay card. Patient sent denial message via mychart.  

## 2021-12-29 ENCOUNTER — Ambulatory Visit (INDEPENDENT_AMBULATORY_CARE_PROVIDER_SITE_OTHER): Payer: 59 | Admitting: Bariatrics

## 2022-01-11 ENCOUNTER — Ambulatory Visit (INDEPENDENT_AMBULATORY_CARE_PROVIDER_SITE_OTHER): Payer: 59 | Admitting: Bariatrics

## 2022-01-11 ENCOUNTER — Inpatient Hospital Stay: Admission: RE | Admit: 2022-01-11 | Payer: 59 | Source: Ambulatory Visit

## 2022-01-11 ENCOUNTER — Encounter (INDEPENDENT_AMBULATORY_CARE_PROVIDER_SITE_OTHER): Payer: Self-pay | Admitting: Bariatrics

## 2022-01-11 VITALS — BP 138/74 | HR 60 | Temp 97.7°F | Ht 66.0 in | Wt 282.0 lb

## 2022-01-11 DIAGNOSIS — Z9884 Bariatric surgery status: Secondary | ICD-10-CM

## 2022-01-11 DIAGNOSIS — R632 Polyphagia: Secondary | ICD-10-CM | POA: Diagnosis not present

## 2022-01-11 DIAGNOSIS — E669 Obesity, unspecified: Secondary | ICD-10-CM

## 2022-01-11 DIAGNOSIS — E8881 Metabolic syndrome: Secondary | ICD-10-CM | POA: Diagnosis not present

## 2022-01-11 DIAGNOSIS — Z6841 Body Mass Index (BMI) 40.0 and over, adult: Secondary | ICD-10-CM

## 2022-01-19 ENCOUNTER — Other Ambulatory Visit (INDEPENDENT_AMBULATORY_CARE_PROVIDER_SITE_OTHER): Payer: Self-pay | Admitting: Bariatrics

## 2022-01-19 ENCOUNTER — Encounter (INDEPENDENT_AMBULATORY_CARE_PROVIDER_SITE_OTHER): Payer: Self-pay | Admitting: Bariatrics

## 2022-01-19 MED ORDER — ONDANSETRON HCL 8 MG PO TABS: 8.0000 mg | ORAL_TABLET | Freq: Three times a day (TID) | ORAL | 0 refills | Status: DC | PRN

## 2022-01-19 NOTE — Telephone Encounter (Signed)
Pt called and states that she took Wegovy 2.4 mg dose, and has had nausea and vomiting. She will start Pedialyte or Gatorade and I am going to send in a Rx for Zofran. If she is unable to keep fluids down, then she will go to the ER or Urgent Care.

## 2022-01-23 ENCOUNTER — Encounter (INDEPENDENT_AMBULATORY_CARE_PROVIDER_SITE_OTHER): Payer: Self-pay | Admitting: Bariatrics

## 2022-01-23 NOTE — Progress Notes (Signed)
Chief Complaint:   OBESITY Kimberly Castro is here to discuss her progress with her obesity treatment plan along with follow-up of her obesity related diagnoses. Kimberly Castro is on the Category 3 Plan and states she is following her eating plan approximately 50% of the time. [name] states she is doing 0 minutes 0 times per week.  Today's visit was #: 17 Starting weight: 344 lbs Starting date: 12/30/2020 Today's weight: 282 lbs Today's date: 01/11/2022 Total lbs lost to date: 62 Total lbs lost since last in-office visit: 0  Interim History: Kimberly Castro is up 7 pounds since her last visit.  She has been unable to get her medication.  She is up about 7 pounds in water weight per the bioimpedance scale.  Subjective:   1. Polyphagia Kimberly Castro notes increase in hunger.  She was taking Mounjaro but no longer covered.  2. S/P gastric bypass Kimberly Castro still has some restrictions.  3. Insulin resistance Kimberly Castro had been on [place] but no longer covered.  Assessment/Plan:   1. Polyphagia Kimberly Castro will stay closely on the plan.  2. S/P gastric bypass Kimberly Castro will eat smaller portions, increase high-protein food, and decrease carbohydrates.  3. Insulin resistance Kimberly Castro will adhere to the plan 80-90%.  Will refill Mounjaro 5 mg once weekly on 01/13/2022 if Greggory Keen is unavailable.  4. Obesity with current BMI of 45.6 Kimberly Castro is currently in the action stage of change. As such, her goal is to continue with weight loss efforts. She has agreed to the Category 3 Plan.   Meal planning was discussed.  Will adhere closely to the plan 80-90%.  Exercise goals: No exercise has been prescribed at this time.  Behavioral modification strategies: increasing lean protein intake, decreasing simple carbohydrates, increasing vegetables, increasing water intake, decreasing eating out, no skipping meals, meal planning and cooking strategies, keeping healthy foods in the home, and planning for success.  Kimberly Castro has agreed to follow-up with our clinic in  4 weeks. She was informed of the importance of frequent follow-up visits to maximize her success with intensive lifestyle modifications for her multiple health conditions.   Objective:   Blood pressure 138/74, pulse 60, temperature 97.7 F (36.5 C), height 5\' 6"  (1.676 m), weight 282 lb (127.9 kg), SpO2 97 %. Body mass index is 45.52 kg/m.  General: Cooperative, alert, well developed, in no acute distress. HEENT: Conjunctivae and lids unremarkable. Cardiovascular: Regular rhythm.  Lungs: Normal work of breathing. Neurologic: No focal deficits.   Lab Results  Component Value Date   CREATININE 1.23 (H) 07/18/2021   BUN 24 07/18/2021   NA 144 07/18/2021   K 3.9 07/18/2021   CL 100 07/18/2021   CO2 29 07/18/2021   Lab Results  Component Value Date   ALT 21 07/18/2021   AST 21 07/18/2021   ALKPHOS 142 (H) 07/18/2021   BILITOT 0.3 07/18/2021   Lab Results  Component Value Date   HGBA1C 4.9 07/18/2021   HGBA1C 5.3 12/30/2020   Lab Results  Component Value Date   INSULIN 7.7 07/18/2021   INSULIN 13.9 12/30/2020   Lab Results  Component Value Date   TSH 1.020 12/30/2020   Lab Results  Component Value Date   CHOL 251 (H) 07/18/2021   HDL 88 07/18/2021   LDLCALC 150 (H) 07/18/2021   TRIG 78 07/18/2021   CHOLHDL 2.3 05/14/2019   Lab Results  Component Value Date   VD25OH 35.6 07/18/2021   VD25OH 31.3 12/30/2020   Lab Results  Component Value Date  WBC 8.8 12/30/2020   HGB 12.6 12/30/2020   HCT 38.4 12/30/2020   MCV 86 12/30/2020   PLT 302 12/30/2020   No results found for: "IRON", "TIBC", "FERRITIN"  Attestation Statements:   Reviewed by clinician on day of visit: allergies, medications, problem list, medical history, surgical history, family history, social history, and previous encounter notes.  Trude Mcburney, am acting as Energy manager for [employer], DO.  I have reviewed the above documentation for accuracy and completeness, and I agree with  the above. Corinna Capra, DO

## 2022-01-25 ENCOUNTER — Encounter (INDEPENDENT_AMBULATORY_CARE_PROVIDER_SITE_OTHER): Payer: Self-pay

## 2022-02-08 ENCOUNTER — Encounter (INDEPENDENT_AMBULATORY_CARE_PROVIDER_SITE_OTHER): Payer: Self-pay | Admitting: Bariatrics

## 2022-02-08 ENCOUNTER — Ambulatory Visit (INDEPENDENT_AMBULATORY_CARE_PROVIDER_SITE_OTHER): Payer: 59 | Admitting: Bariatrics

## 2022-02-08 VITALS — BP 129/76 | HR 61 | Temp 97.4°F | Ht 66.0 in | Wt 275.0 lb

## 2022-02-08 DIAGNOSIS — Z6841 Body Mass Index (BMI) 40.0 and over, adult: Secondary | ICD-10-CM

## 2022-02-08 DIAGNOSIS — E7800 Pure hypercholesterolemia, unspecified: Secondary | ICD-10-CM | POA: Diagnosis not present

## 2022-02-08 DIAGNOSIS — E8881 Metabolic syndrome: Secondary | ICD-10-CM

## 2022-02-08 DIAGNOSIS — E669 Obesity, unspecified: Secondary | ICD-10-CM

## 2022-02-09 ENCOUNTER — Encounter: Payer: Self-pay | Admitting: Psychiatry

## 2022-02-09 ENCOUNTER — Ambulatory Visit
Admission: RE | Admit: 2022-02-09 | Discharge: 2022-02-09 | Disposition: A | Discharge status: Home / Self Care | Payer: 59 | Source: Ambulatory Visit | Attending: Psychiatry | Admitting: Psychiatry

## 2022-02-09 DIAGNOSIS — Z8249 Family history of ischemic heart disease and other diseases of the circulatory system: Secondary | ICD-10-CM

## 2022-02-09 MED ORDER — IOPAMIDOL (ISOVUE-370) INJECTION 76%
75.0000 mL | Freq: Once | INTRAVENOUS | Status: AC | PRN
Start: 2022-02-09 — End: 2022-02-09
  Administered 2022-02-09: 75 mL via INTRAVENOUS

## 2022-02-13 ENCOUNTER — Other Ambulatory Visit: Payer: Self-pay | Admitting: Psychiatry

## 2022-02-13 ENCOUNTER — Other Ambulatory Visit (HOSPITAL_COMMUNITY): Payer: Self-pay

## 2022-02-13 MED ORDER — UBRELVY 100 MG PO TABS: 100.0000 mg | ORAL_TABLET | ORAL | 6 refills | Status: DC | PRN

## 2022-02-13 NOTE — Progress Notes (Signed)
Maxalt discontinued due to possible aneurysm seen on CTA. [name] prescribed for rescue.  Kimberly Castro 02/13/22 4:26 PM

## 2022-02-13 NOTE — Progress Notes (Unsigned)
Chief Complaint:   OBESITY Kimberly Castro is here to discuss her progress with her obesity treatment plan along with follow-up of her obesity related diagnoses. Kimberly Castro is on the Category 3 Plan and states she is following her eating plan approximately 40% of the time. Kimberly Castro states she is not currently exercising.  Today's visit was #: 18 Starting weight: 344 lbs Starting date: 12/30/2020 Today's weight: 275 lbs Today's date: 02/08/22 Total lbs lost to date: 69 Total lbs lost since last in-office visit: -7  Interim History: She is down another 7 pounds since her last visit.   She took the *** ? Says ZOXWRU but she wasn't prescribed Wegovy (by [facility] anyway)  Subjective:   1. Pure hypercholesterolemia No medication.  2. Insulin resistance Taking Wegovy *** not on list.   Had taken [employer] (insurance no longer covering).   Assessment/Plan:   1. Pure hypercholesterolemia 1.  No trans fats 2.   Increase MUFAs and PUFAs.  2. Insulin resistance 1.  Will keep all carbohydrates low (starches and sugars).  3. Obesity with current BMI of 44.5 1.  Meal planning 2.  Intentional eating 3.  Reviewed PDMP. 4.  New prescription for Qsymia 7.5/46 mg 1 capsule daily, #30, no refills (needs sent ***)  Kimberly Castro is currently in the action stage of change. As such, her goal is to continue with weight loss efforts. She has agreed to the Category 3 Plan.   Exercise goals: All adults should avoid inactivity. Some physical activity is better than none, and adults who participate in any amount of physical activity gain some health benefits.  Behavioral modification strategies: increasing lean protein intake, decreasing simple carbohydrates, increasing vegetables, increasing water intake, decreasing eating out, no skipping meals, meal planning and cooking strategies, keeping healthy foods in the home, and planning for success.  Kimberly Castro has agreed to follow-up with our clinic in 4 weeks with Dr. Cathey Endow. She was  informed of the importance of frequent follow-up visits to maximize her success with intensive lifestyle modifications for her multiple health conditions.   Objective:   Blood pressure 129/76, pulse 61, temperature (!) 97.4 F (36.3 C), height 5\' 6"  (1.676 m), weight 275 lb (124.7 kg), SpO2 97 %. Body mass index is 44.39 kg/m.  General: Cooperative, alert, well developed, in no acute distress. HEENT: Conjunctivae and lids unremarkable. Cardiovascular: Regular rhythm.  Lungs: Normal work of breathing. Neurologic: No focal deficits.   Lab Results  Component Value Date   CREATININE 1.23 (H) 07/18/2021   BUN 24 07/18/2021   NA 144 07/18/2021   K 3.9 07/18/2021   CL 100 07/18/2021   CO2 29 07/18/2021   Lab Results  Component Value Date   ALT 21 07/18/2021   AST 21 07/18/2021   ALKPHOS 142 (H) 07/18/2021   BILITOT 0.3 07/18/2021   Lab Results  Component Value Date   HGBA1C 4.9 07/18/2021   HGBA1C 5.3 12/30/2020   Lab Results  Component Value Date   INSULIN 7.7 07/18/2021   INSULIN 13.9 12/30/2020   Lab Results  Component Value Date   TSH 1.020 12/30/2020   Lab Results  Component Value Date   CHOL 251 (H) 07/18/2021   HDL 88 07/18/2021   LDLCALC 150 (H) 07/18/2021   TRIG 78 07/18/2021   CHOLHDL 2.3 05/14/2019   Lab Results  Component Value Date   VD25OH 35.6 07/18/2021   VD25OH 31.3 12/30/2020   Lab Results  Component Value Date   WBC 8.8 12/30/2020  HGB 12.6 12/30/2020   HCT 38.4 12/30/2020   MCV 86 12/30/2020   PLT 302 12/30/2020   No results found for: "IRON", "TIBC", "FERRITIN"  Attestation Statements:   Reviewed by clinician on day of visit: allergies, medications, problem list, medical history, surgical history, family history, social history, and previous encounter notes.  I, Dawn Whitmire, FNP-C, am acting as transcriptionist for Dr. Corinna Capra.  I have reviewed the above documentation for accuracy and completeness, and I agree with the  above. -  ***

## 2022-02-14 ENCOUNTER — Telehealth: Payer: Self-pay | Admitting: *Deleted

## 2022-02-14 ENCOUNTER — Encounter (INDEPENDENT_AMBULATORY_CARE_PROVIDER_SITE_OTHER): Payer: Self-pay | Admitting: Bariatrics

## 2022-02-14 ENCOUNTER — Other Ambulatory Visit: Payer: Self-pay | Admitting: Psychiatry

## 2022-02-14 ENCOUNTER — Other Ambulatory Visit (INDEPENDENT_AMBULATORY_CARE_PROVIDER_SITE_OTHER): Payer: Self-pay | Admitting: Bariatrics

## 2022-02-14 MED ORDER — QULIPTA 60 MG PO TABS
60.0000 mg | ORAL_TABLET | Freq: Every day | ORAL | 6 refills | Status: DC
Start: 2022-02-14 — End: 2022-09-07

## 2022-02-14 MED ORDER — QSYMIA 7.5-46 MG PO CP24
ORAL_CAPSULE | ORAL | 0 refills | Status: DC
Start: 2022-02-14 — End: 2022-04-05

## 2022-02-14 NOTE — Telephone Encounter (Signed)
Received e mail: documents attached. Your information has been sent to [facility]. 

## 2022-02-14 NOTE — Telephone Encounter (Signed)
Qulipta 60 mg PA, Key: [id number]. Cannot find matching patient with Name and Date of Birth provided. For additional information, please contact the phone number on the back of the member prescription ID card. Called [facility], spoke with April, stated I  need to call [facility], [phone]. Called [facility], spoke with Lafonda Mosses who stated patient's pharmacy benefit  Id [phone]. She created PA on CMM, key [id number]. Started PA on CMM, faxed office notes to be attached.

## 2022-02-15 NOTE — Telephone Encounter (Signed)
[name] approved 02/14/2022 - 08/17/2022 Sent her my chart to advise.

## 2022-02-17 ENCOUNTER — Encounter (INDEPENDENT_AMBULATORY_CARE_PROVIDER_SITE_OTHER): Payer: Self-pay | Admitting: Bariatrics

## 2022-02-21 NOTE — Telephone Encounter (Signed)
Hi Monica,  Can you please check on a PA for this patient for me? Thanks in advance.

## 2022-02-21 NOTE — Telephone Encounter (Signed)
Thank you :)

## 2022-02-22 ENCOUNTER — Telehealth (INDEPENDENT_AMBULATORY_CARE_PROVIDER_SITE_OTHER): Payer: Self-pay | Admitting: Bariatrics

## 2022-02-22 ENCOUNTER — Encounter (INDEPENDENT_AMBULATORY_CARE_PROVIDER_SITE_OTHER): Payer: Self-pay

## 2022-02-22 NOTE — Telephone Encounter (Signed)
Dr. Manson Passey - Prior authorization approved for Qsymia. Effective: 02/21/2022 - 02/22/2023. Patient sent approval message via mychart.Marland Kitchen

## 2022-02-23 ENCOUNTER — Telehealth (INDEPENDENT_AMBULATORY_CARE_PROVIDER_SITE_OTHER): Payer: Self-pay | Admitting: Bariatrics

## 2022-02-23 ENCOUNTER — Telehealth (INDEPENDENT_AMBULATORY_CARE_PROVIDER_SITE_OTHER): Payer: Self-pay | Admitting: Family Medicine

## 2022-02-23 NOTE — Telephone Encounter (Signed)
Patient need Dr. Donnal Moat to resubmitt the prescription for Phentermine-topiiramate.

## 2022-02-27 ENCOUNTER — Telehealth (INDEPENDENT_AMBULATORY_CARE_PROVIDER_SITE_OTHER): Payer: Self-pay | Admitting: Family Medicine

## 2022-02-27 NOTE — Telephone Encounter (Signed)
09/11 Patient stated that her pharmacy has not receives intitial request for [facility]. JE

## 2022-03-01 ENCOUNTER — Telehealth (INDEPENDENT_AMBULATORY_CARE_PROVIDER_SITE_OTHER): Payer: Self-pay | Admitting: Family Medicine

## 2022-03-01 NOTE — Telephone Encounter (Signed)
Patient wants her Qysmia refilled. She stated that she has called multiple time and cannot wait unti Friday. JE

## 2022-03-01 NOTE — Telephone Encounter (Signed)
09/12 Patient wants her medication refill she had been waiting for it fo a while. JE

## 2022-03-02 ENCOUNTER — Encounter: Payer: Self-pay | Admitting: Bariatrics

## 2022-03-02 NOTE — Telephone Encounter (Signed)
Left message for patient to return call. Medication was sent to [facility]

## 2022-03-13 ENCOUNTER — Ambulatory Visit (INDEPENDENT_AMBULATORY_CARE_PROVIDER_SITE_OTHER): Payer: 59 | Admitting: Family Medicine

## 2022-04-05 ENCOUNTER — Encounter (INDEPENDENT_AMBULATORY_CARE_PROVIDER_SITE_OTHER): Payer: Self-pay | Admitting: Family Medicine

## 2022-04-05 ENCOUNTER — Ambulatory Visit (INDEPENDENT_AMBULATORY_CARE_PROVIDER_SITE_OTHER): Payer: 59 | Admitting: Family Medicine

## 2022-04-05 VITALS — BP 135/83 | HR 56 | Temp 98.2°F | Ht 66.0 in | Wt 281.0 lb

## 2022-04-05 DIAGNOSIS — E669 Obesity, unspecified: Secondary | ICD-10-CM

## 2022-04-05 DIAGNOSIS — E559 Vitamin D deficiency, unspecified: Secondary | ICD-10-CM

## 2022-04-05 DIAGNOSIS — R632 Polyphagia: Secondary | ICD-10-CM | POA: Diagnosis not present

## 2022-04-05 DIAGNOSIS — E7849 Other hyperlipidemia: Secondary | ICD-10-CM

## 2022-04-05 DIAGNOSIS — Z6841 Body Mass Index (BMI) 40.0 and over, adult: Secondary | ICD-10-CM

## 2022-04-05 MED ORDER — TOPIRAMATE 50 MG PO TABS: 50.0000 mg | ORAL_TABLET | Freq: Two times a day (BID) | ORAL | 0 refills | Status: DC

## 2022-04-13 NOTE — Progress Notes (Signed)
Chief Complaint:   OBESITY Kimberly Castro is here to discuss her progress with her obesity treatment plan along with follow-up of her obesity related diagnoses. Kimberly Castro is on the Category 3 Plan and states she is following her eating plan approximately 50% of the time. Kimberly Castro states she is walking some 60 minutes 2 times per week.  Today's visit was #: 12 Starting weight: 344 lbs Starting date: 12/30/2020 Today's weight: 281 lbs Today's date: 04/05/2022 Total lbs lost to date: 63 lbs Total lbs lost since last in-office visit: +6 lbs  Interim History: She had previously done well on Mounjaro.  She reports a most recent A1c of 5.6.  She was recently tried on Qsymia.  She craves carbohydrates and sugar.  Good portion control now.  Having oatmeal and greek yogurt or eggs in the morning.  Brings veggies and string cheese, smart ones for lunch, grills out meat and veggies for diner.  Husband also working on weight loss.  Eats a Yasso bar for dessert.  Trigger foods at work.  Stress at work.    Subjective:   1. Other hyperlipidemia 001/30/2023 FLP, Total cholesterol 251, LDL 150.  She is not on any lipid lowering medications.   The 10-year ASCVD risk score (Arnett DK, et al., 2019) is: 8.1%   Values used to calculate the score:     Age: 62 years     Sex: Female     Is Non-Hispanic African American: No     Diabetic: Yes     Tobacco smoker: No     Systolic Blood Pressure: 630 mmHg     Is BP treated: Yes     HDL Cholesterol: 88 mg/dL     Total Cholesterol: 251 mg/dL   2. Polyphagia She has been struggling with more with junk food triggers at work.  She has been doing well with protien and fiber with meals.   3. Vitamin D deficiency Last Vitamin D level 35.6.  She is on OTC Vitamin D.   Assessment/Plan:   1. Other hyperlipidemia Follow up with PCP and continue to follow a heart healthy diet.   2. Polyphagia For impulse control:   Begin - topiramate (TOPAMAX) 50 MG tablet; Take 1 tablet  (50 mg total) by mouth 2 (two) times daily.  Dispense: 60 tablet; Refill: 0  3. Vitamin D deficiency She will bring a copy of PCP labs next office visit.   4. Obesity,current BMI 45.4 Discontinue Qsymia.   Kimberly Castro is currently in the action stage of change. As such, her goal is to continue with weight loss efforts. She has agreed to the Category 3 Plan.   Exercise goals:  Increase walking to 3 times per week.  Behavioral modification strategies: increasing lean protein intake, increasing vegetables, increasing water intake, decreasing eating out, no skipping meals, meal planning and cooking strategies, better snacking choices, avoiding temptations, planning for success, and decreasing junk food.  Kimberly Castro has agreed to follow-up with our clinic in 3-4 weeks. She was informed of the importance of frequent follow-up visits to maximize her success with intensive lifestyle modifications for her multiple health conditions.   Objective:   Blood pressure 135/83, pulse (!) 56, temperature 98.2 F (36.8 C), height 5\' 6"  (1.676 m), weight 281 lb (127.5 kg), SpO2 100 %. Body mass index is 45.35 kg/m.  General: Cooperative, alert, well developed, in no acute distress. HEENT: Conjunctivae and lids unremarkable. Cardiovascular: Regular rhythm.  Lungs: Normal work of breathing. Neurologic: No focal deficits.  Lab Results  Component Value Date   CREATININE 1.23 (H) 07/18/2021   BUN 24 07/18/2021   NA 144 07/18/2021   K 3.9 07/18/2021   CL 100 07/18/2021   CO2 29 07/18/2021   Lab Results  Component Value Date   ALT 21 07/18/2021   AST 21 07/18/2021   ALKPHOS 142 (H) 07/18/2021   BILITOT 0.3 07/18/2021   Lab Results  Component Value Date   HGBA1C 4.9 07/18/2021   HGBA1C 5.3 12/30/2020   Lab Results  Component Value Date   INSULIN 7.7 07/18/2021   INSULIN 13.9 12/30/2020   Lab Results  Component Value Date   TSH 1.020 12/30/2020   Lab Results  Component Value Date   CHOL 251 (H)  07/18/2021   HDL 88 07/18/2021   LDLCALC 150 (H) 07/18/2021   TRIG 78 07/18/2021   CHOLHDL 2.3 05/14/2019   Lab Results  Component Value Date   VD25OH 35.6 07/18/2021   VD25OH 31.3 12/30/2020   Lab Results  Component Value Date   WBC 8.8 12/30/2020   HGB 12.6 12/30/2020   HCT 38.4 12/30/2020   MCV 86 12/30/2020   PLT 302 12/30/2020   No results found for: "IRON", "TIBC", "FERRITIN"  Attestation Statements:   Reviewed by clinician on day of visit: allergies, medications, problem list, medical history, surgical history, family history, social history, and previous encounter notes.  I, Malcolm Metro, am acting as Energy manager for Seymour Bars, DO.  I have reviewed the above documentation for accuracy and completeness, and I agree with the above. [name], DO

## 2022-04-26 NOTE — Progress Notes (Deleted)
   CC:  headaches  Follow-up Visit  Last visit: 11/25/21  Brief HPI: 61 year old female with a history of HTN, OSA, B12 deficiency, anxiety, ADHD who follows in clinic for migraines.  At her last visit CTA head was ordered. She was started on Maxalt for rescue.  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 Maxalt was discontinued and she was started on Ubrelvy for rescue.  She continued to have frequent headaches and was started on Qulipta for migraine prevention.  She was recently started on Topamax 50 mg BID for weight loss***  Headache days per month: *** Migraine days per month*** Headache free days per month: ***  Current Headache Regimen: Preventative: *** Abortive: ***   Prior Therapies                                  Metoprolol 25 BID Effexor 225 mg daily Topamax 50 mg BID Qulipta*** Maxalt 10 mg PRN Ubrelvy 100 mg PRN Tramadol Tylenol   Physical Exam:   Vital Signs: There were no vitals taken for this visit. GENERAL:  well appearing, in no acute distress, alert  SKIN:  Color, texture, turgor normal. No rashes or lesions HEAD:  Normocephalic/atraumatic. RESP: normal respiratory effort MSK:  No gross joint deformities.   NEUROLOGICAL: Mental Status: Alert, oriented to person, place and time, Follows commands, and Speech fluent and appropriate. Cranial Nerves: PERRL, face symmetric, no dysarthria, hearing grossly intact Motor: moves all extremities equally Gait: normal-based.  IMPRESSION: ***  PLAN: *** -repeat CTA head 02/10/23  Follow-up: ***  I spent a total of *** minutes on the date of the service. Headache education was done. Discussed lifestyle modification including increased oral hydration, decreased caffeine, exercise and stress management. Discussed treatment options including preventive and acute medications, natural supplements, and infusion therapy. Discussed medication overuse  headache and to limit use of acute treatments to no more than 2 days/week or 10 days/month. Discussed medication side effects, adverse reactions and drug interactions. Written educational materials and patient instructions outlining all of the above were given.  Ocie Doyne, MD

## 2022-04-27 ENCOUNTER — Telehealth: Payer: Self-pay | Admitting: Psychiatry

## 2022-04-27 ENCOUNTER — Ambulatory Visit: Payer: 59 | Admitting: Psychiatry

## 2022-04-27 NOTE — Telephone Encounter (Signed)
Pt cancelled appt due to having a fever and not feeling good. Transferred to Billing.

## 2022-05-08 ENCOUNTER — Encounter (INDEPENDENT_AMBULATORY_CARE_PROVIDER_SITE_OTHER): Payer: Self-pay | Admitting: Family Medicine

## 2022-05-08 ENCOUNTER — Ambulatory Visit (INDEPENDENT_AMBULATORY_CARE_PROVIDER_SITE_OTHER): Payer: 59 | Admitting: Family Medicine

## 2022-05-08 VITALS — BP 141/62 | HR 56 | Temp 98.4°F | Ht 66.0 in | Wt 287.0 lb

## 2022-05-08 DIAGNOSIS — E669 Obesity, unspecified: Secondary | ICD-10-CM | POA: Diagnosis not present

## 2022-05-08 DIAGNOSIS — M170 Bilateral primary osteoarthritis of knee: Secondary | ICD-10-CM | POA: Diagnosis not present

## 2022-05-08 DIAGNOSIS — R430 Anosmia: Secondary | ICD-10-CM | POA: Diagnosis not present

## 2022-05-08 DIAGNOSIS — Z6841 Body Mass Index (BMI) 40.0 and over, adult: Secondary | ICD-10-CM

## 2022-05-08 DIAGNOSIS — R632 Polyphagia: Secondary | ICD-10-CM | POA: Diagnosis not present

## 2022-05-09 DIAGNOSIS — M170 Bilateral primary osteoarthritis of knee: Secondary | ICD-10-CM | POA: Insufficient documentation

## 2022-05-09 DIAGNOSIS — R430 Anosmia: Secondary | ICD-10-CM | POA: Insufficient documentation

## 2022-05-22 NOTE — Progress Notes (Unsigned)
Chief Complaint:   OBESITY Kimberly Castro is here to discuss her progress with her obesity treatment plan along with follow-up of her obesity related diagnoses. Kimberly Castro is on the Category 3 Plan and states she is following her eating plan approximately 25% of the time. Kimberly Castro states she is not exercising.   Today's visit was #: 20 Starting weight: 344 lbs Starting date: 12/30/2020 Today's weight: 287 lbs Today's date: 05/08/2022 Total lbs lost to date: 57 lbs Total lbs lost since last in-office visit: +6 lbs  Interim History: Struggling more with cravings, especially sweets.  Getting in fruits and veggies.  Lean cuisine, meal plans breakfast and dinner.  Snacking is out of control .  Husband is supportive.  Keeping junk foods out of the house.   Subjective:   1. Polyphagia Topiramate unfortunately did not help much with food impulse control.    2. Anosmia Post Covid.  Wants more sweets due to this.  She is able to eat fruits, veggies and some meat.   3. Osteoarthritis of both knees, unspecified osteoarthritis type Right knee hurts more than the left knee.  It is limiting her walking time.  She is scheduled to see ortho .  Actively working on weight loss.   Assessment/Plan:   1. Polyphagia Wean off, Topiramate 50 mg daily for 3 days, then stop.   2. Anosmia Avoid buying high sugar products.  Use seasoning but limit salt and sugar intake.   3. Osteoarthritis of both knees, unspecified osteoarthritis type Continue weight loss plan.  Discussed exercise options as alternatives to walking.   4. Obesity,current BMI 46.5 1) Can have high protein snacks at work.  2) Consider Zepbound once available.   Kimberly Castro is currently in the action stage of change. As such, her goal is to continue with weight loss efforts. She has agreed to the Category 3 Plan.   Exercise goals:  Weights, resistance training at home 10 minutes 3 times per week.   Behavioral modification strategies: increasing lean  protein intake, increasing water intake, decreasing eating out, no skipping meals, meal planning and cooking strategies, keeping healthy foods in the home, holiday eating strategies , and decreasing junk food.  Kimberly Castro has agreed to follow-up with our clinic in 4 weeks. She was informed of the importance of frequent follow-up visits to maximize her success with intensive lifestyle modifications for her multiple health conditions.   Objective:   Blood pressure (!) 141/62, pulse (!) 56, temperature 98.4 F (36.9 C), height 5\' 6"  (1.676 m), weight 287 lb (130.2 kg), SpO2 96 %. Body mass index is 46.32 kg/m.  General: Cooperative, alert, well developed, in no acute distress. HEENT: Conjunctivae and lids unremarkable. Cardiovascular: Regular rhythm.  Lungs: Normal work of breathing. Neurologic: No focal deficits.   Lab Results  Component Value Date   CREATININE 1.23 (H) 07/18/2021   BUN 24 07/18/2021   NA 144 07/18/2021   K 3.9 07/18/2021   CL 100 07/18/2021   CO2 29 07/18/2021   Lab Results  Component Value Date   ALT 21 07/18/2021   AST 21 07/18/2021   ALKPHOS 142 (H) 07/18/2021   BILITOT 0.3 07/18/2021   Lab Results  Component Value Date   HGBA1C 4.9 07/18/2021   HGBA1C 5.3 12/30/2020   Lab Results  Component Value Date   INSULIN 7.7 07/18/2021   INSULIN 13.9 12/30/2020   Lab Results  Component Value Date   TSH 1.020 12/30/2020   Lab Results  Component Value Date  CHOL 251 (H) 07/18/2021   HDL 88 07/18/2021   LDLCALC 150 (H) 07/18/2021   TRIG 78 07/18/2021   CHOLHDL 2.3 05/14/2019   Lab Results  Component Value Date   VD25OH 35.6 07/18/2021   VD25OH 31.3 12/30/2020   Lab Results  Component Value Date   WBC 8.8 12/30/2020   HGB 12.6 12/30/2020   HCT 38.4 12/30/2020   MCV 86 12/30/2020   PLT 302 12/30/2020   No results found for: "IRON", "TIBC", "FERRITIN"  Attestation Statements:   Reviewed by clinician on day of visit: allergies, medications,  problem list, medical history, surgical history, family history, social history, and previous encounter notes.  I, Malcolm Metro, am acting as Energy manager for Seymour Bars, DO.  I have reviewed the above documentation for accuracy and completeness, and I agree with the above. [name], DO

## 2022-06-06 ENCOUNTER — Ambulatory Visit: Payer: 59 | Admitting: Psychiatry

## 2022-06-06 ENCOUNTER — Encounter: Payer: Self-pay | Admitting: Psychiatry

## 2022-06-06 ENCOUNTER — Ambulatory Visit (INDEPENDENT_AMBULATORY_CARE_PROVIDER_SITE_OTHER): Payer: 59 | Admitting: Family Medicine

## 2022-06-06 ENCOUNTER — Telehealth: Payer: Self-pay | Admitting: Psychiatry

## 2022-06-06 ENCOUNTER — Encounter (INDEPENDENT_AMBULATORY_CARE_PROVIDER_SITE_OTHER): Payer: Self-pay | Admitting: Family Medicine

## 2022-06-06 VITALS — BP 148/82 | HR 65 | Temp 98.1°F | Ht 66.0 in | Wt 289.0 lb

## 2022-06-06 DIAGNOSIS — Z6841 Body Mass Index (BMI) 40.0 and over, adult: Secondary | ICD-10-CM | POA: Diagnosis not present

## 2022-06-06 DIAGNOSIS — E669 Obesity, unspecified: Secondary | ICD-10-CM

## 2022-06-06 DIAGNOSIS — E559 Vitamin D deficiency, unspecified: Secondary | ICD-10-CM | POA: Diagnosis not present

## 2022-06-06 DIAGNOSIS — R632 Polyphagia: Secondary | ICD-10-CM | POA: Diagnosis not present

## 2022-06-06 NOTE — Telephone Encounter (Signed)
Pt called wanting to r/s today's appt stating she fell and broke some ribs and has a contusion on her head due to the fall. Pt had an earlier appt elsewhere that she had gone to but states she is not able to move to come to this appt. Pt has no showed a couple of weeks ago and was given a one time no show fee waived. Pt will have to pay no show fee if she would like to reschedule again per office policy.

## 2022-06-06 NOTE — Telephone Encounter (Signed)
Noted  

## 2022-06-06 NOTE — Progress Notes (Deleted)
   CC:  headaches  Follow-up Visit  Last visit: 11/25/21  Brief HPI: 61 year old female with a history of HTN, OSA, B12 deficiency, anxiety, ADHD who follows in clinic for migraines.  At her last visit she was started on Maxalt for migraine rescue. CTA head was ordered.  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 Maxalt was discontinued.  She was started on Qulipta 60 mg daily for headache prevention.   Headache days per month: *** Migraine days per month*** Headache free days per month: ***  Current Headache Regimen: Preventative: *** Abortive: ***   Prior Therapies                                  Metoprolol 25 BID Effexor 225 mg daily Topamax 50 mg BID Qulipta 60 mg daily Tramadol Tylenol  Physical Exam:   Vital Signs: There were no vitals taken for this visit. GENERAL:  well appearing, in no acute distress, alert  SKIN:  Color, texture, turgor normal. No rashes or lesions HEAD:  Normocephalic/atraumatic. RESP: normal respiratory effort MSK:  No gross joint deformities.   NEUROLOGICAL: Mental Status: Alert, oriented to person, place and time, Follows commands, and Speech fluent and appropriate. Cranial Nerves: PERRL, face symmetric, no dysarthria, hearing grossly intact Motor: moves all extremities equally Gait: normal-based.  IMPRESSION: ***  PLAN: ***   Follow-up: ***  I spent a total of *** minutes on the date of the service. Headache education was done. Discussed lifestyle modification including increased oral hydration, decreased caffeine, exercise and stress management. Discussed treatment options including preventive and acute medications, natural supplements, and infusion therapy. Discussed medication overuse headache and to limit use of acute treatments to no more than 2 days/week or 10 days/month. Discussed medication side effects, adverse reactions and drug interactions. Written  educational materials and patient instructions outlining all of the above were given.  Ocie Doyne, MD

## 2022-06-13 NOTE — Progress Notes (Signed)
Chief Complaint:   OBESITY Kimberly Castro is here to discuss her progress with her obesity treatment plan along with follow-up of her obesity related diagnoses. [name] is on the Category 3 Plan and states she is following her eating plan approximately 25% of the time. [name] states she is exercising.  Today's visit was #: 21 Starting weight: 344 LBS Starting date: 12/30/2020 Today's weight: 289 LBS Today's date: 06/06/2022 Total lbs lost to date: 55 LBS Total lbs lost since last in-office visit: +2 LBS  Interim History: Patient had a fall about a week ago, fracturing ribs.  Has been off plan with the holidays.  Snacking more while at home.  Has not been exercising.  Has been eating junk food at work.  Brings lunch to work.  Net weight loss down 55 pounds in 16 months.  Subjective:   1. Polyphagia Did not respond to Qsymia.  Did not respond well to Topamax.  Did see weight loss on Mounjaro, used off label for obesity.  2. Vitamin D deficiency Last vitamin D level 35.6.  Patient complains of fatigue.  Patient is on over-the-counter vitamin D 2000 IU daily.  Assessment/Plan:   1. Polyphagia Work on approval for that [employer]  2. Vitamin D deficiency Increase OTC vitamin D3 to 5000 IU daily.  Recheck level in 2 months.  3. Obesity,current BMI 46.7 1.  Carry 1-2 healthy snacks to work. 2.  Practice mindful eating.  [name] is currently in the action stage of change. As such, her goal is to continue with weight loss efforts. She has agreed to the Category 3 Plan.   Exercise goals: As is.  Behavioral modification strategies: increasing lean protein intake, increasing water intake, increasing high fiber foods, meal planning and cooking strategies, better snacking choices, holiday eating strategies , and planning for success.  [name] has agreed to follow-up with our clinic in 4 weeks. She was informed of the importance of frequent follow-up visits to maximize her success with intensive lifestyle  modifications for her multiple health conditions.   Objective:   Blood pressure (!) 148/82, pulse 65, temperature 98.1 F (36.7 C), height 5\' 6"  (1.676 m), weight 289 lb (131.1 kg), SpO2 98 %. Body mass index is 46.65 kg/m.  General: Cooperative, alert, well developed, in no acute distress. HEENT: Conjunctivae and lids unremarkable. Cardiovascular: Regular rhythm.  Lungs: Normal work of breathing. Neurologic: No focal deficits.   Lab Results  Component Value Date   CREATININE 1.23 (H) 07/18/2021   BUN 24 07/18/2021   NA 144 07/18/2021   K 3.9 07/18/2021   CL 100 07/18/2021   CO2 29 07/18/2021   Lab Results  Component Value Date   ALT 21 07/18/2021   AST 21 07/18/2021   ALKPHOS 142 (H) 07/18/2021   BILITOT 0.3 07/18/2021   Lab Results  Component Value Date   HGBA1C 4.9 07/18/2021   HGBA1C 5.3 12/30/2020   Lab Results  Component Value Date   INSULIN 7.7 07/18/2021   INSULIN 13.9 12/30/2020   Lab Results  Component Value Date   TSH 1.020 12/30/2020   Lab Results  Component Value Date   CHOL 251 (H) 07/18/2021   HDL 88 07/18/2021   LDLCALC 150 (H) 07/18/2021   TRIG 78 07/18/2021   CHOLHDL 2.3 05/14/2019   Lab Results  Component Value Date   VD25OH 35.6 07/18/2021   VD25OH 31.3 12/30/2020   Lab Results  Component Value Date   WBC 8.8 12/30/2020   HGB 12.6  12/30/2020   HCT 38.4 12/30/2020   MCV 86 12/30/2020   PLT 302 12/30/2020   No results found for: "IRON", "TIBC", "FERRITIN"  Attestation Statements:   Reviewed by clinician on day of visit: allergies, medications, problem list, medical history, surgical history, family history, social history, and previous encounter notes.  I, Malcolm Metro, am acting as Energy manager for Seymour Bars, DO.  I have reviewed the above documentation for accuracy and completeness, and I agree with the above. [name], DO

## 2022-06-22 ENCOUNTER — Other Ambulatory Visit: Payer: Self-pay | Admitting: Family Medicine

## 2022-06-22 DIAGNOSIS — M8580 Other specified disorders of bone density and structure, unspecified site: Secondary | ICD-10-CM

## 2022-06-23 ENCOUNTER — Encounter (INDEPENDENT_AMBULATORY_CARE_PROVIDER_SITE_OTHER): Payer: Self-pay

## 2022-07-06 ENCOUNTER — Ambulatory Visit: Payer: 59 | Admitting: Psychiatry

## 2022-07-12 ENCOUNTER — Ambulatory Visit (INDEPENDENT_AMBULATORY_CARE_PROVIDER_SITE_OTHER): Payer: 59 | Admitting: Family Medicine

## 2022-08-21 ENCOUNTER — Other Ambulatory Visit: Payer: Self-pay | Admitting: Psychiatry

## 2022-08-21 NOTE — Telephone Encounter (Signed)
Last seen on 11/25/21 told to follow up in 5 months, no follow up scheduled.

## 2022-09-04 ENCOUNTER — Telehealth: Payer: Self-pay | Admitting: Psychiatry

## 2022-09-04 NOTE — Telephone Encounter (Signed)
Last rx sent 02/14/22 #30, 6 refills. This should last until 09/15/22. Refill request can be addressed at upcoming appt 09/07/22.

## 2022-09-04 NOTE — Telephone Encounter (Signed)
Pt request refill for  Atogepant (QULIPTA) 60 MG TABS at [facility]   Pt schedule appt 09/07/22 at 10:00 am with Jinny Blossom, NP to be able to get medication refill due to Dr. Billey Gosling did not have anything available until October.

## 2022-09-07 ENCOUNTER — Ambulatory Visit (INDEPENDENT_AMBULATORY_CARE_PROVIDER_SITE_OTHER): Payer: 59 | Admitting: Adult Health

## 2022-09-07 ENCOUNTER — Encounter: Payer: Self-pay | Admitting: Adult Health

## 2022-09-07 VITALS — BP 124/76 | HR 56 | Ht 65.0 in | Wt 307.4 lb

## 2022-09-07 DIAGNOSIS — G43009 Migraine without aura, not intractable, without status migrainosus: Secondary | ICD-10-CM

## 2022-09-07 MED ORDER — QULIPTA 60 MG PO TABS: 60.0000 mg | ORAL_TABLET | Freq: Every day | ORAL | 6 refills | Status: DC

## 2022-09-07 MED ORDER — QULIPTA 60 MG PO TABS: 60.0000 mg | ORAL_TABLET | Freq: Every day | ORAL | 0 refills | Status: DC
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
## 2022-09-12 ENCOUNTER — Telehealth: Payer: Self-pay | Admitting: Adult Health

## 2022-09-12 NOTE — Telephone Encounter (Signed)
Kimberly Castro (Key: [place]) PA Case ID #: [id number]  PA Lenoria Chime Complete waiting on approval

## 2022-09-12 NOTE — Telephone Encounter (Signed)
Pt called. Stated she would like for [facility] to call her back. Stated she have more information for her.

## 2022-09-12 NOTE — Telephone Encounter (Signed)
Outcome Approved today Request Reference Number: [id number]. QULIPTA TAB 60MG  is approved through 09/12/2023. Your patient may now fill this prescription and it will be covered. Authorization Expiration Date: 09/12/2023  Will make patient aware of approval

## 2022-10-31 ENCOUNTER — Other Ambulatory Visit (HOSPITAL_COMMUNITY): Payer: Self-pay

## 2022-11-06 ENCOUNTER — Ambulatory Visit (INDEPENDENT_AMBULATORY_CARE_PROVIDER_SITE_OTHER): Payer: 59 | Admitting: Family Medicine

## 2022-11-06 ENCOUNTER — Encounter (INDEPENDENT_AMBULATORY_CARE_PROVIDER_SITE_OTHER): Payer: Self-pay | Admitting: Family Medicine

## 2022-11-06 VITALS — BP 130/74 | HR 63 | Temp 98.7°F | Ht 66.0 in | Wt 309.0 lb

## 2022-11-06 DIAGNOSIS — M170 Bilateral primary osteoarthritis of knee: Secondary | ICD-10-CM

## 2022-11-06 DIAGNOSIS — Z9884 Bariatric surgery status: Secondary | ICD-10-CM

## 2022-11-06 DIAGNOSIS — Z6841 Body Mass Index (BMI) 40.0 and over, adult: Secondary | ICD-10-CM | POA: Diagnosis not present

## 2022-11-06 DIAGNOSIS — E538 Deficiency of other specified B group vitamins: Secondary | ICD-10-CM

## 2022-11-06 MED ORDER — "INSULIN SYRINGE 31G X 5/16"" 1 ML MISC": 0 refills | Status: DC

## 2022-11-06 MED ORDER — CYANOCOBALAMIN 1000 MCG/ML IJ SOLN: 1000.0000 ug | INTRAMUSCULAR | 5 refills | Status: DC

## 2022-11-06 NOTE — Assessment & Plan Note (Signed)
Reports recent injections in both knees and hopes to see improvement in pain so that she can increase her walking time She is needing BMI reduction to have TKRs done  Slowly increase walking time Consider water exercises, chair yoga, weight training as alternative options to walking

## 2022-11-06 NOTE — Assessment & Plan Note (Signed)
Labs reviewed that patient brought in today and it was 170 on 10/03/22. 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

## 2022-11-06 NOTE — Assessment & Plan Note (Signed)
She has some volume restriction from gastric bypass surgery but is unhappy with her weight loss progress She admits to over snacking at work and poor food choices She denies N/V/D/C She is taking a MVI and calcium + vitamin D daily  Work on [employer] protein with meals/ snacks eating 4-5 small meals/ day Continue MVI + calcium citrate + vitamin D daily

## 2022-11-06 NOTE — Assessment & Plan Note (Signed)
Reviewed her overall progress She has gained 20 lb in 5 mos without visits in between She is ready to get back on track Feeling tired and having arthritis pain have been barriers to her success  Keep junk food snacks out of house [place] healthy snacks to work Log daily intake with a goal of 1200 cal/ day and 90+ g of protein daily Begin tracking daily steps (has a smart watch) GLP-1's not covered by her insurance

## 2022-11-06 NOTE — Progress Notes (Signed)
Office: [phone]  /  Fax: [phone]  WEIGHT SUMMARY AND BIOMETRICS  Starting Date: 06/06/22  Starting Weight: 344lb   Weight Lost Since Last Visit: 0lb   Vitals Temp: 98.7 F (37.1 C) BP: 130/74 Pulse Rate: 63 SpO2: 99 %   Body Composition  Body Fat %: 58.6 % Fat Mass (lbs): 181.2 lbs Muscle Mass (lbs): 121.3 lbs Visceral Fat Rating : 23   HPI  Chief Complaint: OBESITY  Ilayna is here to discuss her progress with her obesity treatment plan. She is on the the Category 3 Plan and states she is following her eating plan approximately 20 % of the time. She states she is exercising 0 minutes 0 times per week.   Interval History:  Since last office visit she is up 20 lb in the past 5 mos She feels very tired She brought in labs from [facility] dated 4/16   Her B12 level is low at 170  on oral B12 1,000 mcg daily She has forgetfulness and has fallen more She is keeping healthy choice meals on hand for lunches She usually has a protein shake and / or boiled eggs She is getting in fruits and veggies Her husband is very supportive but she has junk food triggers at work  Pharmacotherapy: none  PHYSICAL EXAM:  Blood pressure 130/74, pulse 63, temperature 98.7 F (37.1 C), height 5\' 6"  (1.676 m), weight (!) 309 lb (140.2 kg), SpO2 99 %. Body mass index is 49.87 kg/m.  General: She is overweight, cooperative, alert, well developed, and in no acute distress. PSYCH: Has normal mood, affect and thought process.   Lungs: Normal breathing effort, no conversational dyspnea.   ASSESSMENT AND PLAN  TREATMENT PLAN FOR OBESITY:  Recommended Dietary Goals  Kimberly Castro is currently in the action stage of change. As such, her goal is to continue weight management plan. She has agreed to keeping a food journal and adhering to recommended goals of 1200 calories and 90+ g  protein.  Behavioral Intervention  We discussed the following Behavioral Modification Strategies today:  increasing lean protein intake, decreasing simple carbohydrates , increasing vegetables, increasing lower glycemic fruits, increasing fiber rich foods, increasing water intake, work on meal planning and preparation, work on Counselling psychologist calories using tracking application, keeping healthy foods at home, avoiding temptations and identifying enticing environmental cues, continue to practice mindfulness when eating, and planning for success.  Additional resources provided today: NA  Recommended Physical Activity Goals  Kimberly Castro has been advised to work up to 150 minutes of moderate intensity aerobic activity a week and strengthening exercises 2-3 times per week for cardiovascular health, weight loss maintenance and preservation of muscle mass.   She has agreed to Think about ways to increase daily physical activity and overcoming barriers to exercise  Pharmacotherapy changes for the treatment of obesity: none  ASSOCIATED CONDITIONS ADDRESSED TODAY  Vitamin B 12 deficiency Assessment & Plan: Labs reviewed that patient brought in today and it was 170 on 10/03/22. 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  Dispense: 1 mL; Refill: 5 -     Insulin Syringe; Use once weekly as directed  Dispense: 90 each; Refill: 0  Morbid obesity (HCC) with starting BMI 55 Assessment & Plan: Reviewed her overall progress She has gained 20 lb in 5 mos without visits in between She is ready to get back on track Feeling tired and having arthritis pain have been barriers to her success  Keep junk food snacks out of house [place] healthy snacks to work Log daily intake with a goal  of 1200 cal/ day and 90+ g of protein daily Begin tracking daily steps (has a smart watch) GLP-1's not covered by her insurance   BMI 45.0-49.9, adult (HCC)  Osteoarthritis of both knees, unspecified osteoarthritis type Assessment & Plan: Reports recent injections in both knees and hopes to see improvement in pain so that she can increase her walking time She is needing BMI reduction to have TKRs done  Slowly increase walking time Consider water exercises, chair yoga, weight training as alternative options to walking   S/P gastric bypass Assessment & Plan: She has some volume restriction from gastric bypass surgery but is unhappy with her weight loss progress She admits to over snacking at work and poor food choices She denies N/V/D/C She is taking a MVI and calcium + vitamin D daily  Work on [employer] protein with meals/ snacks eating 4-5 small meals/ day Continue MVI + calcium citrate + vitamin D daily       She was informed of the importance of frequent follow up visits to maximize her success with intensive lifestyle modifications for her multiple health conditions.   ATTESTASTION STATEMENTS:  Reviewed by clinician on day of visit: allergies, medications, problem list, medical history, surgical history, family history, social history, and previous encounter notes pertinent to obesity diagnosis.   I have personally spent 30 minutes total time today in preparation, patient care, nutritional counseling and documentation for this visit, including the following: review of clinical lab tests; review of medical tests/procedures/services.      Kimberly Brink, DO DABFM, DABOM [facility] [address] [place], [place] [address] [phone]

## 2022-12-01 ENCOUNTER — Other Ambulatory Visit (INDEPENDENT_AMBULATORY_CARE_PROVIDER_SITE_OTHER): Payer: Self-pay | Admitting: Family Medicine

## 2022-12-01 DIAGNOSIS — E538 Deficiency of other specified B group vitamins: Secondary | ICD-10-CM

## 2022-12-07 ENCOUNTER — Ambulatory Visit (INDEPENDENT_AMBULATORY_CARE_PROVIDER_SITE_OTHER): Payer: 59 | Admitting: Family Medicine

## 2022-12-07 ENCOUNTER — Encounter (INDEPENDENT_AMBULATORY_CARE_PROVIDER_SITE_OTHER): Payer: Self-pay | Admitting: Family Medicine

## 2022-12-07 VITALS — BP 133/77 | HR 68 | Temp 98.1°F | Ht 66.0 in | Wt 313.0 lb

## 2022-12-07 DIAGNOSIS — E559 Vitamin D deficiency, unspecified: Secondary | ICD-10-CM

## 2022-12-07 DIAGNOSIS — Z6841 Body Mass Index (BMI) 40.0 and over, adult: Secondary | ICD-10-CM

## 2022-12-07 DIAGNOSIS — E88819 Insulin resistance, unspecified: Secondary | ICD-10-CM | POA: Diagnosis not present

## 2022-12-07 DIAGNOSIS — E538 Deficiency of other specified B group vitamins: Secondary | ICD-10-CM | POA: Diagnosis not present

## 2022-12-07 DIAGNOSIS — Z9884 Bariatric surgery status: Secondary | ICD-10-CM

## 2022-12-07 MED ORDER — TIRZEPATIDE 2.5 MG/0.5ML ~~LOC~~ SOAJ
2.5000 mg | SUBCUTANEOUS | 0 refills | Status: DC
Start: 2022-12-07 — End: 2023-03-09

## 2022-12-07 MED ORDER — CYANOCOBALAMIN 1000 MCG/ML IJ SOLN
1000.0000 ug | INTRAMUSCULAR | 6 refills | Status: AC
Start: 2022-12-07 — End: ?

## 2022-12-07 NOTE — Assessment & Plan Note (Signed)
Her last B12 level was low at 170 on B12 1000 mcg daily pill.  She has changed over to B12 injections 1000 mcg once weekly.  Her B12 level was rechecked by her new PCP agrees her medical associate, and reviewed today lab dated 11/20/2022.  B12 level was 702.  Her energy level is much improved.  She denies memory loss or paresthesias.  Will change her B12 injections to 1000 mcg once monthly.  Recheck level in the next 3 months.  She will likely need to remain on B12 monthly injections given her history of gastric bypass surgery with intestinal malabsorption.

## 2022-12-07 NOTE — Assessment & Plan Note (Signed)
Patient continues to struggle with feeling adequate satiety with mealtime despite her gastric bypass surgery.  She has failed to see weight loss using Qsymia and topiramate.  She has added in a multivitamin once daily.  She denies issues with acid reflux, nausea, vomiting or constipation.  She has had postop malabsorption problems with B12 deficiency, vitamin D deficiency.  Her energy level is improving now.  Continue to focus on a reduced calorie high-protein low carbohydrate/low sugar diet, exercise to include both cardio and resistance training 4-5 times a week.  If patient is unable to get [facility] covered for insulin resistance, there are little other medications that can be used at this point in time.

## 2022-12-07 NOTE — Progress Notes (Signed)
Office: [phone]  /  Fax: [phone]  WEIGHT SUMMARY AND BIOMETRICS  Starting Date: 06/06/22  Starting Weight: 344lb   Weight Lost Since Last Visit: 0lb   Vitals Temp: 98.1 F (36.7 C) BP: 133/77 Pulse Rate: 68 SpO2: 99 %   Body Composition  Body Fat %: 62.4 % Fat Mass (lbs): 195.4 lbs Muscle Mass (lbs): 111.8 lbs Visceral Fat Rating : 24     HPI  Chief Complaint: OBESITY  Kimberly Castro is here to discuss her progress with her obesity treatment plan. She is on the the Category 3 Plan and states she is following her eating plan approximately 50 % of the time. She states she is exercising 0 minutes 0 times per week.   Interval History:  Since last office visit she is up 4 lb Energy levels are better on B12 injections weekly Leg edema bilat - worse She is battling a big appetite She is getting in protein with meals and snacks She is having plain Skyr yogurt + blackberries, bringing lean cusiine to work and making dinner at home.  Has a veggie at dinner.  Picking pretzels, veggie sticks for snacks, berries, apples, craving sweets Anosmia post COVID Taking vitamin MVI, D  Bilat knee pain limits walking time and she plans to start water therapy She is starting chair exercise  Labs reviewed from 11/20/2022 from [place] associate includes: Folic acid, CBC, iron within normal limits B12 702 Total cholesterol 275, LDL 155, triglyceride 107, HDL 102--restarted on rosuvastatin 5 mg daily by PCP Creatinine 1.01, alk phos 148, A1c 5.5, vitamin D36.2 DEXA is scheduled for 7/2  Pharmacotherapy: none  PHYSICAL EXAM:  Blood pressure 133/77, pulse 68, temperature 98.1 F (36.7 C), height 5\' 6"  (1.676 m), weight (!) 313 lb (142 kg), SpO2 99 %. Body mass index is 50.52 kg/m.  General: She is overweight, cooperative, alert, well developed, and in no acute distress. PSYCH: Has normal mood, affect and thought process.   Lungs: Normal breathing effort, no conversational  dyspnea.   ASSESSMENT AND PLAN  TREATMENT PLAN FOR OBESITY:  Recommended Dietary Goals  [place] is currently in the action stage of change. As such, her goal is to continue weight management plan. She has agreed to the Category 3 Plan.  Behavioral Intervention  We discussed the following Behavioral Modification Strategies today: increasing lean protein intake, decreasing simple carbohydrates , increasing vegetables, increasing lower glycemic fruits, increasing water intake, work on meal planning and preparation, reading food labels , keeping healthy foods at home, continue to practice mindfulness when eating, and planning for success.  Additional resources provided today: NA  Recommended Physical Activity Goals  [place] has been advised to work up to 150 minutes of moderate intensity aerobic activity a week and strengthening exercises 2-3 times per week for cardiovascular health, weight loss maintenance and preservation of muscle mass.   She has agreed to Think about ways to increase daily physical activity and overcoming barriers to exercise  Pharmacotherapy changes for the treatment of obesity: None  ASSOCIATED CONDITIONS ADDRESSED TODAY  Insulin resistance Assessment & Plan: Fasting insulin as high as 13.9 in 2022, slightly improved at last blood draw.  She has previously had some short-term success using Mounjaro for insulin resistance with the added benefit of weight control.  She denied having any adverse side effects.  She is actively working on reducing her intake of added sugar and refined carbohydrates.  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  Continue to work on reducing  high glycemic index foods, eating on a schedule getting lean protein and fiber with meals.  We discussed the importance of regular exercise to help with insulin sensitivity.  Restart Mounjaro 2.5 mg once weekly injection for insulin resistance.  Orders: -     Tirzepatide; Inject 2.5  mg into the skin once a week.  Dispense: 2 mL; Refill: 0  Vitamin B 12 deficiency Assessment & Plan: Her last B12 level was low at 170 on B12 1000 mcg daily pill.  She has changed over to B12 injections 1000 mcg once weekly.  Her B12 level was rechecked by her new PCP agrees her medical associate, and reviewed today lab dated 11/20/2022.  B12 level was 702.  Her energy level is much improved.  She denies memory loss or paresthesias.  Will change her B12 injections to 1000 mcg once monthly.  Recheck level in the next 3 months.  She will likely need to remain on B12 monthly injections given her history of gastric bypass surgery with intestinal malabsorption.  Orders: -     Cyanocobalamin; Inject 1 mL (1,000 mcg total) into the muscle every 30 (thirty) days.  Dispense: 1 mL; Refill: 6  Morbid obesity (HCC) with starting BMI 55 Assessment & Plan: Reviewed patient's overall progress.  She is down 31 pounds in the past 2 years of medically supervised weight management.  This is a 9% total body weight loss.  She has struggled with postop regain status post gastric bypass surgery.  She experiences high levels of hunger.  She has been working on increasing her intake of lean protein and fiber with meals, meal planning and cutting out sweets.  She has previously failed to see weight loss using Qsymia and topiramate.  She lacks coverage for GLP-1 receptor agonist use for obesity management.  Recommend resuming category 3 meal plan, keeping junk food triggers out of the house, adequate water intake throughout the day and increasing intake of nonstarchy vegetables.  She may allow up to 2 fruit servings per day for fiber.   BMI 50.0-59.9, adult (HCC)  S/P gastric bypass Assessment & Plan: Patient continues to struggle with feeling adequate satiety with mealtime despite her gastric bypass surgery.  She has failed to see weight loss using Qsymia and topiramate.  She has added in a multivitamin once daily.  She  denies issues with acid reflux, nausea, vomiting or constipation.  She has had postop malabsorption problems with B12 deficiency, vitamin D deficiency.  Her energy level is improving now.  Continue to focus on a reduced calorie high-protein low carbohydrate/low sugar diet, exercise to include both cardio and resistance training 4-5 times a week.  If patient is unable to get [facility] covered for insulin resistance, there are little other medications that can be used at this point in time.   Vitamin D deficiency Assessment & Plan: Vitamin D level was rechecked by new PCP 11/20/2022.  Vitamin D level was 36.2.  Goal vitamin D level is 50-70 to help with leptin resistance and improved bone health given high risk for osteoporosis status post gastric bypass surgery.  She has been taking over-the-counter vitamin D 5000 IU once daily.  Continue over-the-counter vitamin D 5000 IU once daily.  Recheck level in 3 to 4 months.       She was informed of the importance of frequent follow up visits to maximize her success with intensive lifestyle modifications for her multiple health conditions.   ATTESTASTION STATEMENTS:  Reviewed by clinician on day of  visit: allergies, medications, problem list, medical history, surgical history, family history, social history, and previous encounter notes pertinent to obesity diagnosis.   I have personally spent 30 minutes total time today in preparation, patient care, nutritional counseling and documentation for this visit, including the following: review of clinical lab tests; review of medical tests/procedures/services.      Dell Ponto, DO DABFM, DABOM [facility] [address] [place], [place] [address] [phone]

## 2022-12-07 NOTE — Assessment & Plan Note (Signed)
Vitamin D level was rechecked by new PCP 11/20/2022.  Vitamin D level was 36.2.  Goal vitamin D level is 50-70 to help with leptin resistance and improved bone health given high risk for osteoporosis status post gastric bypass surgery.  She has been taking over-the-counter vitamin D 5000 IU once daily.  Continue over-the-counter vitamin D 5000 IU once daily.  Recheck level in 3 to 4 months.

## 2022-12-07 NOTE — Assessment & Plan Note (Signed)
Fasting insulin as high as 13.9 in 2022, slightly improved at last blood draw.  She has previously had some short-term success using Mounjaro for insulin resistance with the added benefit of weight control.  She denied having any adverse side effects.  She is actively working on reducing her intake of added sugar and refined carbohydrates.  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  Continue to work on reducing high glycemic index foods, eating on a schedule getting lean protein and fiber with meals.  We discussed the importance of regular exercise to help with insulin sensitivity.  Restart Mounjaro 2.5 mg once weekly injection for insulin resistance.

## 2022-12-07 NOTE — Assessment & Plan Note (Signed)
Reviewed patient's overall progress.  She is down 31 pounds in the past 2 years of medically supervised weight management.  This is a 9% total body weight loss.  She has struggled with postop regain status post gastric bypass surgery.  She experiences high levels of hunger.  She has been working on increasing her intake of lean protein and fiber with meals, meal planning and cutting out sweets.  She has previously failed to see weight loss using Qsymia and topiramate.  She lacks coverage for GLP-1 receptor agonist use for obesity management.  Recommend resuming category 3 meal plan, keeping junk food triggers out of the house, adequate water intake throughout the day and increasing intake of nonstarchy vegetables.  She may allow up to 2 fruit servings per day for fiber.

## 2022-12-11 ENCOUNTER — Encounter (INDEPENDENT_AMBULATORY_CARE_PROVIDER_SITE_OTHER): Payer: Self-pay | Admitting: *Deleted

## 2022-12-11 ENCOUNTER — Telehealth (INDEPENDENT_AMBULATORY_CARE_PROVIDER_SITE_OTHER): Payer: Self-pay

## 2022-12-11 NOTE — Telephone Encounter (Signed)
Prior authorization denied for patients Mounjaro. Patient has been notified.  

## 2022-12-11 NOTE — Telephone Encounter (Signed)
PA for Greggory Keen was denied. Patient has been notified.

## 2022-12-11 NOTE — Telephone Encounter (Signed)
2 PA's have been submitted for Mounjaro, and both came back patient not found. I therefore called the patients insurance provider to see why I was getting this reply, and they said her name did not match the name they have her listed under.   Whomever submitted the first two PA's didn't enter Kimberly Castro, only Kimberly Castro. So I submitted a new PA today with her complete, correct name.   Awaiting a determination. 12/11/22

## 2022-12-19 ENCOUNTER — Encounter: Payer: Self-pay | Admitting: Family Medicine

## 2022-12-19 ENCOUNTER — Ambulatory Visit
Admission: RE | Admit: 2022-12-19 | Discharge: 2022-12-19 | Disposition: A | Discharge status: Home / Self Care | Payer: 59 | Source: Ambulatory Visit | Attending: Family Medicine | Admitting: Family Medicine

## 2022-12-19 DIAGNOSIS — M8580 Other specified disorders of bone density and structure, unspecified site: Secondary | ICD-10-CM

## 2023-01-11 ENCOUNTER — Ambulatory Visit (INDEPENDENT_AMBULATORY_CARE_PROVIDER_SITE_OTHER): Payer: 59 | Admitting: Family Medicine

## 2023-01-11 VITALS — BP 147/75 | HR 59 | Temp 98.0°F | Ht 66.0 in | Wt 317.0 lb

## 2023-01-11 DIAGNOSIS — Z9884 Bariatric surgery status: Secondary | ICD-10-CM

## 2023-01-11 DIAGNOSIS — I10 Essential (primary) hypertension: Secondary | ICD-10-CM | POA: Diagnosis not present

## 2023-01-11 DIAGNOSIS — M170 Bilateral primary osteoarthritis of knee: Secondary | ICD-10-CM

## 2023-01-11 DIAGNOSIS — E559 Vitamin D deficiency, unspecified: Secondary | ICD-10-CM

## 2023-01-11 DIAGNOSIS — Z6841 Body Mass Index (BMI) 40.0 and over, adult: Secondary | ICD-10-CM

## 2023-01-11 NOTE — Progress Notes (Signed)
Office: [phone]  /  Fax: [phone]  WEIGHT SUMMARY AND BIOMETRICS  No data recorded  No data recorded   No data recorded   No data recorded  No data recorded    HPI  Chief Complaint: OBESITY  Kimberly Castro is here to discuss her progress with her obesity treatment plan. She is on the the Category 3 Plan and states she is following her eating plan approximately 50 % of the time. She states she is exercising 0 minutes 0 times per week.   Interval History:  Since last office visit she is up 4 lb She is down 27 lb in the  past 2 years of medically supervised weight management She is eating more snacks/ sweets She is able to eat more food volume  at meals She is hydrating well outside of mealtime She is s/p gastric bypass surgery with some volume restriction She has not been as active due to knee pain and edema She sits at her job all day She did not have coverage for any GLP-1 receptor agonist  Pharmacotherapy: none  PHYSICAL EXAM:  Blood pressure (!) 147/75, pulse (!) 59, temperature 98 F (36.7 C), height 5\' 6"  (1.676 m), weight (!) 317 lb (143.8 kg), SpO2 97%. Body mass index is 51.17 kg/m.  General: She is overweight, cooperative, alert, well developed, and in no acute distress. PSYCH: Has normal mood, affect and thought process.   Lungs: Normal breathing effort, no conversational dyspnea.   ASSESSMENT AND PLAN  TREATMENT PLAN FOR OBESITY:  Recommended Dietary Goals  Kimberly Castro is currently in the action stage of change. As such, her goal is to continue weight management plan. She has agreed to the Category 3 Plan.  Behavioral Intervention  We discussed the following Behavioral Modification Strategies today: increasing lean protein intake, decreasing simple carbohydrates , increasing vegetables, increasing lower glycemic fruits, avoiding skipping meals, increasing water intake, keeping healthy foods at home, avoiding temptations and identifying enticing  environmental cues, continue to work on implementation of reduced calorie nutritional plan, continue to practice mindfulness when eating, and planning for success.  Additional resources provided today: NA  Recommended Physical Activity Goals  Kimberly Castro has been advised to work up to 150 minutes of moderate intensity aerobic activity a week and strengthening exercises 2-3 times per week for cardiovascular health, weight loss maintenance and preservation of muscle mass.   She has agreed to Increase the intensity, frequency or duration of aerobic exercises    Pharmacotherapy changes for the treatment of obesity: none  ASSOCIATED CONDITIONS ADDRESSED TODAY  Osteoarthritis of both knees, unspecified osteoarthritis type Assessment & Plan: Bilateral knee DJD pain has limited her ability to increase exercise.  Added weight has only contributed moe to pain.  Will need BMI reduction in order to have TKR done.    Follow up with ortho Plan to check out local options for water exercise   Morbid obesity (HCC) with starting BMI 55  BMI 50.0-59.9, adult (HCC)  Vitamin D deficiency Assessment & Plan: Last vitamin D Lab Results  Component Value Date   VD25OH 35.6 07/18/2021   She had a vitamin D level of 36.2 done 11/21/22 at her PCP office She is currently taking OTC vitamin D 5,0000 international units  daily   We disscussed a target vitamin D level of 50-70 Reminded her to take her OTC vitamin D everyday.   S/P gastric bypass Assessment & Plan: Kimberly Castro has some volume restriction at mealtime but has struggled with weight regain the past  few years exp with limited exercise due to arthritis pain.  She is taking a MVI daily + Calciium with vitamin D   She had a folic acid level of 16.1, B12 702 and a normal iron panel done 11/21/22, reviewed today  Continue to work on 4 small meals per day, each with lean protein, drinking sugar free/ carbonated free fluids outside of mealtime and continue current  vitamins   Essential hypertension Assessment & Plan: BP is elevated today.  She reports good compliance on metoprolol 25 mg bid, hydrochlorothiazide 12.5 mg daily.  She is not monitoring home BP readings.  Denies CP or HA.  Advised to continue to work on healthy eating, regular exercise and weight reduction Suggested checking Bps at work once a week        She was informed of the importance of frequent follow up visits to maximize her success with intensive lifestyle modifications for her multiple health conditions.   ATTESTASTION STATEMENTS:  Reviewed by clinician on day of visit: allergies, medications, problem list, medical history, surgical history, family history, social history, and previous encounter notes pertinent to obesity diagnosis.   I have personally spent 30 minutes total time today in preparation, patient care, nutritional counseling and documentation for this visit, including the following: review of clinical lab tests; review of medical tests/procedures/services.      [name], DO DABFM, DABOM [facility] [address] [place], [place] [address] [phone]

## 2023-01-14 NOTE — Assessment & Plan Note (Signed)
Bilateral knee DJD pain has limited her ability to increase exercise.  Added weight has only contributed moe to pain.  Will need BMI reduction in order to have TKR done.    Follow up with ortho Plan to check out local options for water exercise

## 2023-01-14 NOTE — Assessment & Plan Note (Signed)
Kimberly Castro has some volume restriction at mealtime but has struggled with weight regain the past few years exp with limited exercise due to arthritis pain.  She is taking a MVI daily + Calciium with vitamin D   She had a folic acid level of 16.1, B12 702 and a normal iron panel done 11/21/22, reviewed today  Continue to work on 4 small meals per day, each with lean protein, drinking sugar free/ carbonated free fluids outside of mealtime and continue current vitamins

## 2023-01-14 NOTE — Assessment & Plan Note (Signed)
BP is elevated today.  She reports good compliance on metoprolol 25 mg bid, hydrochlorothiazide 12.5 mg daily.  She is not monitoring home BP readings.  Denies CP or HA.  Advised to continue to work on healthy eating, regular exercise and weight reduction Suggested checking Bps at work once a week

## 2023-01-14 NOTE — Assessment & Plan Note (Signed)
Last vitamin D Lab Results  Component Value Date   VD25OH 35.6 07/18/2021   She had a vitamin D level of 36.2 done 11/21/22 at her PCP office She is currently taking OTC vitamin D 5,0000 international units  daily   We disscussed a target vitamin D level of 50-70 Reminded her to take her OTC vitamin D everyday.

## 2023-01-15 ENCOUNTER — Encounter: Payer: Self-pay | Admitting: Adult Health

## 2023-01-15 DIAGNOSIS — I671 Cerebral aneurysm, nonruptured: Secondary | ICD-10-CM

## 2023-01-15 NOTE — Telephone Encounter (Signed)
[facility] NPR Case Number: [id number] sent to GI [id number]

## 2023-02-05 ENCOUNTER — Ambulatory Visit
Admission: RE | Admit: 2023-02-05 | Discharge: 2023-02-05 | Disposition: A | Discharge status: Home / Self Care | Payer: 59 | Source: Ambulatory Visit | Attending: Adult Health | Admitting: Adult Health

## 2023-02-05 DIAGNOSIS — I671 Cerebral aneurysm, nonruptured: Secondary | ICD-10-CM

## 2023-02-05 MED ORDER — IOPAMIDOL (ISOVUE-370) INJECTION 76%
75.0000 mL | Freq: Once | INTRAVENOUS | Status: AC | PRN
  Administered 2023-02-05: 75 mL via INTRAVENOUS

## 2023-02-08 ENCOUNTER — Encounter: Payer: Self-pay | Admitting: Adult Health

## 2023-02-08 NOTE — Telephone Encounter (Signed)
Duplicate msg.

## 2023-02-28 ENCOUNTER — Ambulatory Visit (HOSPITAL_BASED_OUTPATIENT_CLINIC_OR_DEPARTMENT_OTHER): Payer: 59 | Admitting: Cardiology

## 2023-02-28 NOTE — Progress Notes (Incomplete)
  Cardiology Office Note:  .    Date:  02/28/2023  ID:  [name], DOB 12-26-1960, MRN [id number] PCP: Sigmund Hazel, MD  [facility] Providers Cardiologist:  Chilton Si, MD { Click to update primary MD,subspecialty MD or APP then REFRESH:1}    History of Present Illness: .    ESSANCE MCSPADDEN is a 63 y.o. female with a hx of hypertension, hyperlipidemia, fibromyalgia, OSA on CPAP, prior gastric bypass surgery, anemia, here to re-establish care. Previously followed by Dr. Duke Salvia, last seen 06/10/2020 during a virtual visit. She had struggled with recurrent UTIs and was recovering from surgical repair of her cystocele and rectocele. Remained compliant with CPAP. She also complained of fatigue and thought it was due to atorvastatin. She was advised to hold atorvastatin for 2 weeks to see if her symptoms improved. Currently on Zetia 10 mg daily.  Cardiac Hx: Initially evaluated by Dr. Duke Salvia 03/2016 for palpitations. She wore a 14-day monitor showing PACs and PVCs. Palpitations improved on metoprolol. She reported exertional dyspnea and had Lexiscan Myoview 04/2017 with LVEF 55% and breast attenuation artifact but no ischemia. Echo in 05/2019 with LVEF 61% and grade 2 diastolic dysfunction.   Cardiovascular risk factors: Prior clinical ASCVD:  Comorbid conditions: Hypertension - was on HCTZ and metoprolol. Hyperlipidemia - Currently on Zetia 10 mg daily. Metabolic syndrome/Obesity:  Highest adult weight *** lbs. Current weight *** lbs. Chronic inflammatory conditions: Tobacco use history: Family history: Her father had an abdominal aortic aneurysm, heart failure. Her mother had atrial fibrillation, heart failure, kidney disease. Her brother had CAD, stroke, PAD, COPD, diabetes.  Prior pertinent testing and/or incidental findings:   Exercise level: Current diet:  Today,  She denies any palpitations, chest pain, shortness of breath, peripheral edema,  lightheadedness, headaches, syncope, orthopnea, or PND.  ROS:  Please see the history of present illness. ROS otherwise negative except as noted.  (+)  Studies Reviewed: .         *** Risk Assessment/Calculations:    {Does this patient have ATRIAL FIBRILLATION?:[phone]} No BP recorded.  {Refresh Note OR Click here to enter BP  :1}***       Physical Exam:    VS:  There were no vitals taken for this visit.   Wt Readings from Last 3 Encounters:  01/11/23 (!) 317 lb (143.8 kg)  12/07/22 (!) 313 lb (142 kg)  11/06/22 (!) 309 lb (140.2 kg)    GEN: Well nourished, well developed in no acute distress HEENT: Normal, moist mucous membranes NECK: No JVD CARDIAC: regular rhythm, normal S1 and S2, no rubs or gallops. ***No murmur. VASCULAR: Radial and DP pulses 2+ bilaterally. No carotid bruits RESPIRATORY:  Clear to auscultation without rales, wheezing or rhonchi  ABDOMEN: Soft, non-tender, non-distended MUSCULOSKELETAL:  Ambulates independently SKIN: Warm and dry, no edema NEUROLOGIC:  Alert and oriented x 3. No focal neuro deficits noted. PSYCHIATRIC:  Normal affect   ASSESSMENT AND PLAN: .    ***     {Are you ordering a CV Procedure (e.g. stress test, cath, DCCV, TEE, etc)?   Press F2        :[id number]}  Dispo: Follow-up in *** months, or sooner as needed.  I,Mathew Stumpf,acting as a Neurosurgeon for [employer], MD.,have documented all relevant documentation on the behalf of Jodelle Red, MD,as directed by  Jodelle Red, MD while in the presence of Jodelle Red, MD.  ***  Signed, Carlena Bjornstad

## 2023-03-07 NOTE — Telephone Encounter (Signed)
error 

## 2023-03-08 NOTE — Telephone Encounter (Signed)
error 

## 2023-03-09 ENCOUNTER — Encounter (HOSPITAL_BASED_OUTPATIENT_CLINIC_OR_DEPARTMENT_OTHER): Payer: Self-pay | Admitting: Cardiovascular Disease

## 2023-03-09 ENCOUNTER — Ambulatory Visit (HOSPITAL_BASED_OUTPATIENT_CLINIC_OR_DEPARTMENT_OTHER): Payer: 59 | Admitting: Cardiovascular Disease

## 2023-03-09 ENCOUNTER — Encounter (HOSPITAL_BASED_OUTPATIENT_CLINIC_OR_DEPARTMENT_OTHER): Payer: Self-pay

## 2023-03-09 VITALS — BP 144/68 | HR 69 | Ht 66.0 in | Wt 330.1 lb

## 2023-03-09 DIAGNOSIS — E7849 Other hyperlipidemia: Secondary | ICD-10-CM | POA: Diagnosis not present

## 2023-03-09 DIAGNOSIS — I10 Essential (primary) hypertension: Secondary | ICD-10-CM

## 2023-03-09 DIAGNOSIS — R609 Edema, unspecified: Secondary | ICD-10-CM

## 2023-03-09 DIAGNOSIS — I700 Atherosclerosis of aorta: Secondary | ICD-10-CM

## 2023-03-09 DIAGNOSIS — Z5181 Encounter for therapeutic drug level monitoring: Secondary | ICD-10-CM

## 2023-03-09 DIAGNOSIS — Z789 Other specified health status: Secondary | ICD-10-CM | POA: Diagnosis not present

## 2023-03-09 DIAGNOSIS — Z9884 Bariatric surgery status: Secondary | ICD-10-CM

## 2023-03-09 DIAGNOSIS — R0602 Shortness of breath: Secondary | ICD-10-CM

## 2023-03-09 HISTORY — DX: Atherosclerosis of aorta: I70.0

## 2023-03-09 HISTORY — DX: Shortness of breath: R06.02

## 2023-03-09 HISTORY — DX: Other specified health status: Z78.9

## 2023-03-09 MED ORDER — VALSARTAN 160 MG PO TABS: 160.0000 mg | ORAL_TABLET | Freq: Every day | ORAL | 3 refills | Status: DC

## 2023-03-09 NOTE — Progress Notes (Signed)
Cardiology Office Note:  .    Date:  03/09/2023  ID:  Kimberly Castro, DOB 04/26/1961, MRN [id number] PCP: Sigmund Hazel, MD  [facility] Providers Cardiologist:  Chilton Si, MD     History of Present Illness: .    Kimberly Castro is a 62 y.o. female with OSA on CPAP, hyperlipidemia, hypertension, and fibromyalgia, who presents for follow-up.  She was first seen 03/2016 for an evaluation of palpitations.  She was referred for 14-day event monitor that showed PACs and PVCs.  She had laboratory testing that revealed normal blood counts and normal electrolytes. 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  Her thyroid function was normal. D-dimer was mildly elevated but she had a subsequent CT angiogram of the chest that was negative for PE.  She had an echo 03/2017 that revealed LVEF 55-60%.  She noted exertional dyspnea and went for [facility] 04/2017 that revealed LVEF 55% and breast attenuation artifact but no ischemia.   In 04/2019 Ms. Laurell Josephs reported exertional dyspnea.  She had a [employer] 05/2019 that revealed LVEF 60% and no ischemia.  She also had an echocardiogram at that time that revealed LVEF 61% with grade 2 diastolic dysfunction.  Right atrial pressure was 8 mmHg.    At her virtual visit 05/2020, she was struggling with recurrent UTIs and had two procedures to repair of her cystocele and rectocele. She was recovering well but complained of fatigue. Thyroid function was checked a year prior and was normal. She continued to use her CPAP regularly and slept well at night. She had been reading that atorvastatin can cause fatigue and wondered if this was contributing. She was advised to try holding atorvastatin for 2 weeks to see if her fatigue would improve; if not then she would resume atorvastatin. She was not monitoring blood pressures at home but had many OB appointments with normal blood pressures. 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  Today, she states that overall she isn't feeling well. Her main concerns today include her weight and swelling in her ankles. Currently she is taking lasix 20 mg every other day, since about a month ago. Her swelling has improved somewhat but is still present. She complains of shortness of breath with walking from her car to the office where she works. Sometimes she needs to stop and rest for a minute. At work she sits at a desk, but tries to get up and walk around every hour. For activity she also performs chair exercises. Her formal exercise is significantly limited by her bilateral knee pain. Injection therapy has been ineffective. She confirms she is not a candidate for knee replacement due to her weight. She has been working with [employer] [facility]. Previously she successfully lost 80 lbs on Mounjaro but this became prohibitively expensive once the patient assistance ran out. Since then she has gradually regained weight and struggles with food cravings. About 20 years ago she had gastric bypass surgery. She reports her latest A1C was 5.3%. She just initiated Repatha earlier this week. Palpitations have been rare and overall stable on her metoprolol. In the office her blood pressure is 138/76 initially, and 144/68 on manual recheck; she confirms her home readings are typically similar. EKG was performed today showing sinus rhythm at 69 bpm with incomplete bundle branch block. She denies any chest pain, lightheadedness, headaches, syncope, orthopnea,  or PND.  ROS:  Please see the history of present illness. All other systems are reviewed and negative.  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  Risk Assessment/Calculations:     HYPERTENSION CONTROL Vitals:   03/09/23 0902 03/09/23 0934  BP: 138/76 (!) 144/68    The patient's blood pressure is elevated above target today.  In order to address the patient's elevated BP: A current anti-hypertensive medication was adjusted today.    Physical Exam:    VS:  BP (!) 144/68 (BP Location: Right Arm, Patient Position: Sitting, Cuff Size: Large)   Pulse 69   Ht 5\' 6"  (1.676 m)   Wt (!) 330 lb 1.6 oz (149.7 kg)   BMI 53.28 kg/m  , BMI Body mass index is 53.28 kg/m. GENERAL:  Well appearing HEENT: Pupils equal round and reactive, fundi not visualized, oral mucosa unremarkable NECK:  No jugular venous distention, waveform within normal limits, carotid upstroke brisk and symmetric, no bruits, no thyromegaly LUNGS:  Clear to auscultation bilaterally HEART:  RRR.  PMI not displaced or sustained,S1 and S2 within normal limits, no S3, no S4, no clicks, no rubs, 2/6 systolic murmur ABD:  Flat, positive bowel sounds normal in frequency in pitch, no bruits, no rebound, no guarding, no midline pulsatile mass, no hepatomegaly, no splenomegaly EXT:  2 plus pulses throughout, + LE edema bilaterally, no cyanosis no clubbing SKIN:  No rashes no nodules NEURO:  Cranial nerves II through XII grossly intact, motor grossly intact throughout PSYCH:  Cognitively intact, oriented to person place and time  Wt Readings from Last 3 Encounters:  03/09/23 (!) 330 lb 1.6 oz (149.7 kg)  01/11/23 (!) 317 lb (143.8 kg)  12/07/22 (!) 313 lb  (142 kg)     ASSESSMENT AND PLAN: .    # Morbid Obesity: Struggling with weight gain after discontinuation of Mounjaro due to cost. History of gastric bypass 20 years ago. Currently attending a weight loss clinic and attempting to maintain a healthy diet.  -Continue current efforts and follow-up with weight loss clinic.  # Lower extremity edema Improved with Lasix every other day, but still present. No associated chest pain or pressure. Shortness of breath with exertion noted. -Change Lasix to daily dosing. -Discontinue Hydrochlorothiazide and start Valsartan 160mg  daily to reduce diuretic burden on kidneys. -Order Basic Metabolic Panel and BNP in one week to assess kidney function and heart failure status.  # Hyperlipidemia Recently started on Repatha. -Continue Repatha as prescribed.  # PAC/PVCs: Stable on Metoprolol. -Continue Metoprolol as prescribed.  Follow-up In a couple of months to assess blood pressure, fluid status, and breathing.      Dispo:  FU with APP or with Niurka Benecke C. Duke Salvia, MD, [facility] in 2 months.  I,Mathew Stumpf,acting as a Neurosurgeon for Chilton Si, MD.,have documented all relevant documentation on the behalf of Chilton Si, MD,as directed  by  Chilton Si, MD while in the presence of Chilton Si, MD.  I, Tarell Schollmeyer C. Duke Salvia, MD have reviewed all documentation for this visit.  The documentation of the exam, diagnosis, procedures, and orders on 03/09/2023 are all accurate and complete.   Signed, Chilton Si, MD

## 2023-03-09 NOTE — Patient Instructions (Addendum)
Medication Instructions:  STOP HYDROCHLOROTHIAZIDE  START VALSARTAN 160 MG DAILY    *If you need a refill on your cardiac medications before your next appointment, please call your pharmacy*  Lab Work: BMET/BNP IN 1 WEEK   If you have labs (blood work) drawn today and your tests are completely normal, you will receive your results only by: MyChart Message (if you have MyChart) OR A paper copy in the mail If you have any lab test that is abnormal or we need to change your treatment, we will call you to review the results.  Testing/Procedures: Your physician has requested that you have an echocardiogram. Echocardiography is a painless test that uses sound waves to create images of your heart. It provides your doctor with information about the size and shape of your heart and how well your heart's chambers and valves are working. This procedure takes approximately one hour. There are no restrictions for this procedure. Please do NOT wear cologne, perfume, aftershave, or lotions (deodorant is allowed). Please arrive 15 minutes prior to your appointment time.   Follow-Up: At [facility], you and your health needs are our priority.  As part of our continuing mission to provide you with exceptional heart care, we have created designated Provider Care Teams.  These Care Teams include your primary Cardiologist (physician) and Advanced Practice Providers (APPs -  Physician Assistants and Nurse Practitioners) who all work together to provide you with the care you need, when you need it.  We recommend signing up for the patient portal called "MyChart".  Sign up information is provided on this After Visit Summary.  MyChart is used to connect with patients for Virtual Visits (Telemedicine).  Patients are able to view lab/test results, encounter notes, upcoming appointments, etc.  Non-urgent messages can be sent to your provider as well.   To learn more about what you can do with MyChart, go to  [email or web address].    Your next appointment:   2 month(s)  Provider:   Chilton Si, MD or Gillian Shields, NP

## 2023-03-12 ENCOUNTER — Ambulatory Visit (INDEPENDENT_AMBULATORY_CARE_PROVIDER_SITE_OTHER): Payer: 59 | Admitting: Family Medicine

## 2023-03-12 ENCOUNTER — Encounter (INDEPENDENT_AMBULATORY_CARE_PROVIDER_SITE_OTHER): Payer: Self-pay | Admitting: Family Medicine

## 2023-03-12 VITALS — BP 105/66 | HR 66 | Temp 98.3°F | Ht 66.0 in | Wt 325.0 lb

## 2023-03-12 DIAGNOSIS — F419 Anxiety disorder, unspecified: Secondary | ICD-10-CM

## 2023-03-12 DIAGNOSIS — E669 Obesity, unspecified: Secondary | ICD-10-CM | POA: Diagnosis not present

## 2023-03-12 DIAGNOSIS — Z6841 Body Mass Index (BMI) 40.0 and over, adult: Secondary | ICD-10-CM | POA: Diagnosis not present

## 2023-03-12 DIAGNOSIS — I700 Atherosclerosis of aorta: Secondary | ICD-10-CM

## 2023-03-12 NOTE — Progress Notes (Unsigned)
Chief Complaint:   OBESITY Kimberly Castro is here to discuss her progress with her obesity treatment plan along with follow-up of her obesity related diagnoses. Kimberly Castro is on the Category 3 Plan and states she is following her eating plan approximately 32% of the time. Kimberly Castro states she is doing 0 minutes 0 times per week.  Today's visit was #: 22 Starting weight: 344 lbs Starting date: 12/30/2020 Today's weight: 325 lbs Today's date: 03/12/2023 Total lbs lost to date: 19 Total lbs lost since last in-office visit: 0  Interim History: Former Dr. Manson Passey and Dr. Cathey Endow patient.  She was last seen at end of July 2024; this is her first appointment with me.  She is feeling hopeless. Her knee is bothering her and she cannot get surgery until her BMI goes down.  She has back issues as well.  Patient is thinking perhaps she needs break from the clinic because she does not feel she is in the active state of change.  Subjective:   1. Aortic atherosclerosis [facility]) Patient sees Dr. Duke Salvia (cardiology), and mentions increased dyspnea for which she is set to undergo an echocardiogram.  2. Anxiety Patient is on Effexor and Wellbutrin.  She is feeling depressed currently due to health issues and she does not have a psychiatrist or therapist currently.  Assessment/Plan:   1. Aortic atherosclerosis (HCC) Patient agreed to start Wegovy 0.25 mg subcu weekly with no refills.  2. Anxiety We discussed at length the importance of finding mental health assistance (psychiatrist and therapist).  3. BMI 50.0-59.9, adult (HCC)  4. Obesity with starting BMI 55.2 Kimberly Castro is currently in the action stage of change. As such, her goal is to continue with weight loss efforts. She has agreed to practicing portion control and making smarter food choices, such as increasing vegetables and decreasing simple carbohydrates.   Exercise goals: All adults should avoid inactivity. Some physical activity is better than none, and adults  who participate in any amount of physical activity gain some health benefits.  Behavioral modification strategies: increasing lean protein intake, meal planning and cooking strategies, keeping healthy foods in the home, and planning for success.  Kimberly Castro has agreed to follow-up with our clinic as needed. She was informed of the importance of frequent follow-up visits to maximize her success with intensive lifestyle modifications for her multiple health conditions.   Objective:   Blood pressure 105/66, pulse 66, temperature 98.3 F (36.8 C), height 5\' 6"  (1.676 m), weight (!) 325 lb (147.4 kg), SpO2 99%. Body mass index is 52.46 kg/m.  General: Cooperative, alert, well developed, in no acute distress. HEENT: Conjunctivae and lids unremarkable. Cardiovascular: Regular rhythm.  Lungs: Normal work of breathing. Neurologic: No focal deficits.   Lab Results  Component Value Date   CREATININE 1.23 (H) 07/18/2021   BUN 24 07/18/2021   NA 144 07/18/2021   K 3.9 07/18/2021   CL 100 07/18/2021   CO2 29 07/18/2021   Lab Results  Component Value Date   ALT 21 07/18/2021   AST 21 07/18/2021   ALKPHOS 142 (H) 07/18/2021   BILITOT 0.3 07/18/2021   Lab Results  Component Value Date   HGBA1C 4.9 07/18/2021   HGBA1C 5.3 12/30/2020   Lab Results  Component Value Date   INSULIN 7.7 07/18/2021   INSULIN 13.9 12/30/2020   Lab Results  Component Value Date   TSH 1.020 12/30/2020   Lab Results  Component Value Date   CHOL 251 (H) 07/18/2021   HDL  88 07/18/2021   LDLCALC 150 (H) 07/18/2021   TRIG 78 07/18/2021   CHOLHDL 2.3 05/14/2019   Lab Results  Component Value Date   VD25OH 35.6 07/18/2021   VD25OH 31.3 12/30/2020   Lab Results  Component Value Date   WBC 8.8 12/30/2020   HGB 12.6 12/30/2020   HCT 38.4 12/30/2020   MCV 86 12/30/2020   PLT 302 12/30/2020   No results found for: "IRON", "TIBC", "FERRITIN"  Attestation Statements:   Reviewed by clinician on day of  visit: allergies, medications, problem list, medical history, surgical history, family history, social history, and previous encounter notes.  Time spent on visit including pre-visit chart review and post-visit care and charting was 30 minutes.   I, Burt Knack, am acting as transcriptionist for Reuben Likes, MD.  I have reviewed the above documentation for accuracy and completeness, and I agree with the above. - Reuben Likes, MD

## 2023-03-14 ENCOUNTER — Other Ambulatory Visit (HOSPITAL_COMMUNITY): Payer: Self-pay

## 2023-03-14 MED ORDER — SEMAGLUTIDE-WEIGHT MANAGEMENT 0.25 MG/0.5ML ~~LOC~~ SOAJ
0.2500 mg | SUBCUTANEOUS | 0 refills | Status: DC
  Filled 2023-03-14 (×2): qty 2, 28d supply, fill #0

## 2023-03-14 MED ORDER — WEGOVY 0.25 MG/0.5ML ~~LOC~~ SOAJ
0.2500 mg | SUBCUTANEOUS | 0 refills | Status: DC
Start: 2023-03-14 — End: 2023-04-23

## 2023-03-15 ENCOUNTER — Telehealth: Payer: Self-pay | Admitting: *Deleted

## 2023-03-15 NOTE — Telephone Encounter (Signed)
Prior authorization for Kimberly Castro was denied. It is not covered by her plan. Patient notified.

## 2023-03-15 NOTE — Telephone Encounter (Signed)
Prior authorization done via cover my meds for patients [facility] waiting on determination.

## 2023-03-21 LAB — BASIC METABOLIC PANEL
BUN/Creatinine Ratio: 18 (ref 12–28)
BUN: 19 mg/dL (ref 8–27)
CO2: 24 mmol/L (ref 20–29)
Calcium: 9.2 mg/dL (ref 8.7–10.3)
Chloride: 103 mmol/L (ref 96–106)
Creatinine, Ser: 1.07 mg/dL — ABNORMAL HIGH (ref 0.57–1.00)
Glucose: 81 mg/dL (ref 70–99)
Potassium: 4.2 mmol/L (ref 3.5–5.2)
Sodium: 142 mmol/L (ref 134–144)
eGFR: 59 mL/min/{1.73_m2} — ABNORMAL LOW (ref >59)

## 2023-03-21 LAB — BRAIN NATRIURETIC PEPTIDE: BNP: 76.8 pg/mL (ref 0.0–100.0)

## 2023-04-06 ENCOUNTER — Ambulatory Visit (HOSPITAL_BASED_OUTPATIENT_CLINIC_OR_DEPARTMENT_OTHER): Payer: 59

## 2023-04-06 ENCOUNTER — Encounter (HOSPITAL_BASED_OUTPATIENT_CLINIC_OR_DEPARTMENT_OTHER): Payer: Self-pay

## 2023-04-06 DIAGNOSIS — I10 Essential (primary) hypertension: Secondary | ICD-10-CM | POA: Diagnosis not present

## 2023-04-06 DIAGNOSIS — R600 Localized edema: Secondary | ICD-10-CM

## 2023-04-06 DIAGNOSIS — R609 Edema, unspecified: Secondary | ICD-10-CM

## 2023-04-06 DIAGNOSIS — I872 Venous insufficiency (chronic) (peripheral): Secondary | ICD-10-CM

## 2023-04-06 DIAGNOSIS — R0602 Shortness of breath: Secondary | ICD-10-CM | POA: Diagnosis not present

## 2023-04-06 LAB — ECHOCARDIOGRAM COMPLETE
AR max vel: 1.87 cm2
AV Area VTI: 1.73 cm2
AV Area mean vel: 1.94 cm2
AV Mean grad: 14 mm[Hg]
AV Peak grad: 24.8 mm[Hg]
Ao pk vel: 2.49 m/s
Area-P 1/2: 3.01 cm2
P 1/2 time: 657 ms
S' Lateral: 3.39 cm

## 2023-04-09 ENCOUNTER — Other Ambulatory Visit (HOSPITAL_BASED_OUTPATIENT_CLINIC_OR_DEPARTMENT_OTHER): Payer: Self-pay

## 2023-04-09 DIAGNOSIS — I350 Nonrheumatic aortic (valve) stenosis: Secondary | ICD-10-CM

## 2023-04-09 MED ORDER — FUROSEMIDE 40 MG PO TABS: 40.0000 mg | ORAL_TABLET | Freq: Every day | ORAL | 3 refills | Status: DC

## 2023-04-09 NOTE — Addendum Note (Signed)
Addended by: Marlene Lard on: 04/09/2023 04:40 PM   Modules accepted: Orders

## 2023-04-23 ENCOUNTER — Encounter: Payer: Self-pay | Admitting: Adult Health

## 2023-04-23 ENCOUNTER — Ambulatory Visit (INDEPENDENT_AMBULATORY_CARE_PROVIDER_SITE_OTHER): Payer: 59 | Admitting: Adult Health

## 2023-04-23 VITALS — BP 143/69 | HR 62 | Ht 65.0 in | Wt 337.0 lb

## 2023-04-23 DIAGNOSIS — I671 Cerebral aneurysm, nonruptured: Secondary | ICD-10-CM | POA: Diagnosis not present

## 2023-04-23 DIAGNOSIS — G43009 Migraine without aura, not intractable, without status migrainosus: Secondary | ICD-10-CM | POA: Diagnosis not present
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
## 2023-05-21 ENCOUNTER — Other Ambulatory Visit: Payer: Self-pay | Admitting: Adult Health

## 2023-05-28 ENCOUNTER — Other Ambulatory Visit: Payer: Self-pay

## 2023-05-28 ENCOUNTER — Ambulatory Visit (HOSPITAL_BASED_OUTPATIENT_CLINIC_OR_DEPARTMENT_OTHER): Payer: 59 | Admitting: Family

## 2023-05-28 DIAGNOSIS — I872 Venous insufficiency (chronic) (peripheral): Secondary | ICD-10-CM

## 2023-06-06 NOTE — Progress Notes (Signed)
Patient ID: Kimberly Castro, female   DOB: Dec 29, 1960, 62 y.o.   MRN: [id number]  Reason for Consult: New Patient (Initial Visit)   Referred by Sigmund Hazel, MD  Subjective:     HPI  Kimberly Castro is a 62 y.o. female who presents for evaluation of lower extremity swelling Timeframe: Many years Symptoms: Fatigue and aching pain Varicosities: No Previous wounds: No Previous DVT: No In compression: Has tried but cannot tolerate  Past Medical History:  Diagnosis Date   ADD (attention deficit disorder)    Anemia    Anxiety    Aortic atherosclerosis (HCC) 03/09/2023   B12 deficiency    Back pain    CHF (congestive heart failure) (HCC)    Chronic fatigue    CKD (chronic kidney disease), stage III (HCC)    Complication of anesthesia    during colonoscopy BP dropped very low   Constipation    Depression    Depression    Fatigue 06/10/2020   Fatty liver    Fibromyalgia    Headache    Heart murmur    no problems with it   Hypercholesteremia    Hypertension    Hypoglycemia    IBS (irritable bowel syndrome)    IBS (irritable bowel syndrome)    Osteopenia    Palpitations 03/31/2016   anxiety related   Shortness of breath 03/09/2023   Sleep apnea    uses cpap   SOB (shortness of breath)    Statin intolerance 03/09/2023   Family History  Problem Relation Age of Onset   Kidney disease Mother    Heart failure Mother    COPD Mother    Atrial fibrillation Mother    Kidney failure Mother    High blood pressure Mother    High Cholesterol Mother    Sleep apnea Mother    AAA (abdominal aortic aneurysm) Father    COPD Father    Heart disease Father    Heart failure Father    High Cholesterol Father    Sleep apnea Father    Peripheral Artery Disease Brother    COPD Brother    COPD Brother    Stroke Brother    Diabetes Brother    CAD Brother    Diabetes Maternal Grandmother    Heart disease Maternal Grandmother    Heart disease Maternal Grandfather     Diabetes Maternal Grandfather    Diabetes Paternal Grandmother    Stroke Paternal Grandmother    Heart disease Paternal Grandfather    Testicular cancer Son 49   Past Surgical History:  Procedure Laterality Date   ANTERIOR AND POSTERIOR REPAIR N/A 06/23/2019   Procedure: ANTERIOR (CYSTOCELE) AND POSTERIOR REPAIR (RECTOCELE);  Surgeon: Osborn Coho, MD;  Location: [facility] OR;  Service: Gynecology;  Laterality: N/A;  3 hours   BLADDER SUSPENSION N/A 06/23/2019   Procedure: TRANSVAGINAL TAPE (TVT) PROCEDURE;  Surgeon: Osborn Coho, MD;  Location: [facility] OR;  Service: Gynecology;  Laterality: N/A;   COLONOSCOPY     GASTRIC BYPASS     LAPAROSCOPY N/A 08/12/2016   Procedure: LAPAROSCOPY DIAGNOSTIC, POSSIBLE LAPAROTOMY, POSSIBLE BOWEL RESECTION;  Surgeon: Glenna Fellows, MD;  Location: [facility] OR;  Service: General;  Laterality: N/A;   PARTIAL HYSTERECTOMY     still has ovaries   TONSILLECTOMY     WISDOM TOOTH EXTRACTION      Short Social History:  Social History   Tobacco Use   Smoking status: Former   Smokeless tobacco: Never   Tobacco  comments:    quit 1990  Substance Use Topics   Alcohol use: Yes    Comment: very rare    Allergies  Allergen Reactions   Lipitor [Atorvastatin] Other (See Comments)    myalgias    Current Outpatient Medications  Medication Sig Dispense Refill   amphetamine-dextroamphetamine (ADDERALL XR) 30 MG 24 hr capsule Take 1 capsule (30 mg total) by mouth in the morning. (01-21-22) 30 capsule 0   buPROPion (WELLBUTRIN XL) 300 MG 24 hr tablet TAKE 1 TABLET(300 MG) BY MOUTH DAILY 90 tablet 1   cetirizine (ZYRTEC) 10 MG tablet Take 10 mg by mouth daily.     Cholecalciferol 125 MCG (5000 UT) capsule Take 5,000 Units by mouth daily.     cyanocobalamin (VITAMIN B12) 1000 MCG/ML injection Inject 1 mL (1,000 mcg total) into the muscle every 30 (thirty) days. 1 mL 6   denosumab (PROLIA) 60 MG/ML SOSY injection Inject 60 mg into the skin every 6 (six) months.     ezetimibe  (ZETIA) 10 MG tablet Take 10 mg by mouth daily.     fluticasone (FLONASE) 50 MCG/ACT nasal spray Place 1 spray into both nostrils daily.   1   furosemide (LASIX) 40 MG tablet Take 1 tablet (40 mg total) by mouth daily. 90 tablet 3   metoprolol tartrate (LOPRESSOR) 25 MG tablet Take 1 tablet (25 mg total) by mouth 2 (two) times daily. 180 tablet 3   potassium chloride (KLOR-CON M) 10 MEQ tablet Take 10 mEq by mouth daily.     QULIPTA 60 MG TABS TAKE 1 TABLET BY MOUTH EVERY DAY 30 tablet 6   REPATHA SURECLICK 140 MG/ML SOAJ Inject 140 mg into the skin every 14 (fourteen) days.     valsartan (DIOVAN) 160 MG tablet Take 1 tablet (160 mg total) by mouth daily. 90 tablet 3   Venlafaxine HCl 225 MG TB24 Take 225 mg by mouth daily with breakfast.   1   zolpidem (AMBIEN) 10 MG tablet Take 10 mg by mouth at bedtime as needed for sleep.   0   No current facility-administered medications for this visit.    REVIEW OF SYSTEMS  Positive for shortness of breath with activity  All other systems were reviewed and are negative     Objective:  Objective   Vitals:   06/08/23 1114  BP: 135/79  Pulse: (!) 52  Resp: 20  Temp: 98.2 F (36.8 C)  SpO2: 94%  Weight: (!) 343 lb (155.6 kg)  Height: 5\' 5"  (1.651 m)   Body mass index is 57.08 kg/m.  Physical Exam General: no acute distress Cardiac: hemodynamically stable Pulm: normal work of breathing Neuro: alert, no focal deficit Extremities: Significant 2+ swelling bilaterally from dorsum of foot to mid thigh.  No signs of healed wounds although there is some areas of skin irritation Vascular:   Right: palpable DP  Left: palpable DP   Data: Reflux study Venous Reflux Times  +--------------+---------+------+-----------+------------+--------+  RIGHT        Reflux NoRefluxReflux TimeDiameter cmsComments                          Yes                                    +--------------+---------+------+-----------+------------+--------+  CFV  yes   >1 second                       +--------------+---------+------+-----------+------------+--------+  FV prox       no                                              +--------------+---------+------+-----------+------------+--------+  FV mid        no                                              +--------------+---------+------+-----------+------------+--------+  Popliteal    no                                              +--------------+---------+------+-----------+------------+--------+  GSV at [facility]    no                           0.851              +--------------+---------+------+-----------+------------+--------+  GSV prox thigh          yes    >500 ms     0.581              +--------------+---------+------+-----------+------------+--------+  GSV mid thigh no                           0.447              +--------------+---------+------+-----------+------------+--------+  GSV dist thighno                           0.508              +--------------+---------+------+-----------+------------+--------+  GSV at knee   no                           0.502              +--------------+---------+------+-----------+------------+--------+  GSV prox calf no                            0.38              +--------------+---------+------+-----------+------------+--------+  SSV Pop Fossa no                           0.247              +--------------+---------+------+-----------+------------+--------+  SSV prox calf no                           0.265              +--------------+---------+------+-----------+------------+--------+  SSV mid calf  no                           0.334              +--------------+---------+------+-----------+------------+--------+  Echo reviewed EF 60 to 65%.  RSVP normal, left atrium  moderately dilated, mild mitral regurg.  Mild aortic stenosis  Most recent BMP reviewed, creatinine 1.07     Assessment/Plan:     CHESNEY GOLDE is a 62 y.o. female with lymphedema.  Venous reflux study today demonstrates reflux in the proximal GSV and common femoral although would not be responsible for this little swelling.  Explained that we could refer her to the lymphedema clinic for further management. Follow up as needed      Daria Pastures MD [facility]

## 2023-06-08 ENCOUNTER — Ambulatory Visit (INDEPENDENT_AMBULATORY_CARE_PROVIDER_SITE_OTHER): Payer: 59 | Admitting: Family

## 2023-06-08 ENCOUNTER — Encounter (HOSPITAL_BASED_OUTPATIENT_CLINIC_OR_DEPARTMENT_OTHER): Payer: Self-pay | Admitting: Family

## 2023-06-08 ENCOUNTER — Encounter: Payer: Self-pay | Admitting: Vascular Surgery

## 2023-06-08 ENCOUNTER — Ambulatory Visit (INDEPENDENT_AMBULATORY_CARE_PROVIDER_SITE_OTHER): Payer: 59 | Admitting: Vascular Surgery

## 2023-06-08 ENCOUNTER — Ambulatory Visit (HOSPITAL_COMMUNITY)
Admission: RE | Admit: 2023-06-08 | Discharge: 2023-06-08 | Disposition: A | Discharge status: Home / Self Care | Payer: 59 | Source: Ambulatory Visit | Attending: Vascular Surgery | Admitting: Vascular Surgery

## 2023-06-08 ENCOUNTER — Other Ambulatory Visit (HOSPITAL_BASED_OUTPATIENT_CLINIC_OR_DEPARTMENT_OTHER): Payer: Self-pay

## 2023-06-08 VITALS — BP 135/79 | HR 52 | Temp 98.2°F | Resp 20 | Ht 65.0 in | Wt 343.0 lb

## 2023-06-08 VITALS — BP 118/60 | HR 73 | Ht 65.0 in | Wt 345.7 lb

## 2023-06-08 DIAGNOSIS — I350 Nonrheumatic aortic (valve) stenosis: Secondary | ICD-10-CM | POA: Diagnosis not present

## 2023-06-08 DIAGNOSIS — R600 Localized edema: Secondary | ICD-10-CM | POA: Diagnosis not present

## 2023-06-08 DIAGNOSIS — I890 Lymphedema, not elsewhere classified: Secondary | ICD-10-CM

## 2023-06-08 DIAGNOSIS — I872 Venous insufficiency (chronic) (peripheral): Secondary | ICD-10-CM

## 2023-06-08 DIAGNOSIS — I10 Essential (primary) hypertension: Secondary | ICD-10-CM

## 2023-06-08 MED ORDER — SPIRONOLACTONE 25 MG PO TABS
12.5000 mg | ORAL_TABLET | Freq: Every day | ORAL | 3 refills | Status: DC
  Filled 2023-06-08: qty 45, 90d supply, fill #0

## 2023-06-08 NOTE — Patient Instructions (Addendum)
Medication Instructions:  Your physician has recommended you make the following change in your medication:   STOP Potassium START Spironolactone 12.5 mg  *If you need a refill on your cardiac medications before your next appointment, please call your pharmacy*   Lab Work: Your provider recommends the following labs be drawn in a week: BMP  If you have labs (blood work) drawn today and your tests are completely normal, you will receive your results only by: MyChart Message (if you have MyChart) OR A paper copy in the mail If you have any lab test that is abnormal or we need to change your treatment, we will call you to review the results.  Follow-Up: At [facility], you and your health needs are our priority.  As part of our continuing mission to provide you with exceptional heart care, we have created designated Provider Care Teams.  These Care Teams include your primary Cardiologist (physician) and Advanced Practice Providers (APPs -  Physician Assistants and Nurse Practitioners) who all work together to provide you with the care you need, when you need it.  We recommend signing up for the patient portal called "MyChart".  Sign up information is provided on this After Visit Summary.  MyChart is used to connect with patients for Virtual Visits (Telemedicine).  Patients are able to view lab/test results, encounter notes, upcoming appointments, etc.  Non-urgent messages can be sent to your provider as well.   To learn more about what you can do with MyChart, go to [email or web address].    Your next appointment:   2-3 month(s)  Provider:   Chilton Si, MD or Gillian Shields, NP    Other Instructions To prevent or reduce lower extremity swelling: Eat a low salt diet. Salt makes the body hold onto extra fluid which causes swelling. Sit with legs elevated. For example, in the recliner or on an ottoman.  Wear knee-high compression stockings during the daytime. Ones labeled  15-20 mmHg provide good compression.

## 2023-06-08 NOTE — Progress Notes (Unsigned)
  Cardiology Office Note:  .   Date:  06/08/2023  ID:  Kimberly Castro, DOB [birth year], MRN [id number] PCP: Linus Galas, NP  [facility] Providers Cardiologist:  Chilton Si, MD { Click to update primary MD,subspecialty MD or APP then REFRESH:1}   History of Present Illness: .   Kimberly Castro is a 62 y.o. female ***  Notes her lower extremity edema  *** did not notice improvement with increasing Lasix to end of the day. She does note her swelling is better in the morning and worse in the evening. 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 She does try to prop her feet up on a low stool and walks around every hour.   She did ont tolerate compression stockings - notes they were not tolerable due to her fibromyalgia and size of her thighs.   Reports lightheadedness particularly if she gets up from a seated position occurring every other week or two. Blood pressure at home most often 130s.   ROS: ***  Studies Reviewed: .        *** Risk Assessment/Calculations:   {Does this patient have ATRIAL FIBRILLATION?:[phone]}         Physical Exam:   VS:  BP 118/60   Pulse 73   Ht 5\' 5"  (1.651 m)   Wt (!) 345 lb 11.2 oz (156.8 kg)   SpO2 94%   BMI 57.53 kg/m    Wt Readings from Last 3 Encounters:  06/08/23 (!) 345 lb 11.2 oz (156.8 kg)  06/08/23 (!) 343 lb (155.6 kg)  04/23/23 (!) 337 lb (152.9 kg)    GEN: Well nourished, well developed in no acute distress NECK: No JVD; No carotid bruits CARDIAC: ***RRR, no murmurs, rubs, gallops RESPIRATORY:  Clear to auscultation without rales, wheezing or rhonchi  ABDOMEN: Soft, non-tender, non-distended EXTREMITIES:  No edema; No deformity   ASSESSMENT AND PLAN: .   ***    {Are you ordering a CV Procedure (e.g. stress test, cath, DCCV, TEE, etc)?   Press F2        :[id number]}  Dispo: ***  Signed, Alver Sorrow, NP

## 2023-06-14 ENCOUNTER — Other Ambulatory Visit (HOSPITAL_BASED_OUTPATIENT_CLINIC_OR_DEPARTMENT_OTHER): Payer: Self-pay

## 2023-06-22 ENCOUNTER — Telehealth: Payer: Self-pay | Admitting: Cardiovascular Disease

## 2023-06-22 NOTE — Telephone Encounter (Signed)
 Pt is requesting a callback regarding her seeing a physical therapist for her legs. Please advise

## 2023-06-22 NOTE — Telephone Encounter (Signed)
 Triage team - please call and get more info. [facility] referred her to [facility] for lymphedema. Unclear if this is what she is asking about or alternate. If this referral, would recommend she reach out to [facility].  Alver Sorrow, NP

## 2023-06-25 NOTE — Telephone Encounter (Signed)
 Spoke with patient, stated that  [facility] not longer has therapist.  Advised patient to reach out to vein center who referred her, verbalized understanding

## 2023-06-27 ENCOUNTER — Other Ambulatory Visit (INDEPENDENT_AMBULATORY_CARE_PROVIDER_SITE_OTHER): Payer: Self-pay | Admitting: Family Medicine

## 2023-06-27 DIAGNOSIS — E538 Deficiency of other specified B group vitamins: Secondary | ICD-10-CM

## 2023-07-25 ENCOUNTER — Telehealth: Payer: Self-pay

## 2023-07-25 NOTE — Telephone Encounter (Signed)
 Appointment/Referral: -Pt called to get a different referral for a lymphedema clinic, that [facility] does not have an therapist right not. -Advised pt to find a clinic that can accommodate her and we will send them a referral.

## 2023-07-26 ENCOUNTER — Telehealth: Payer: Self-pay

## 2023-07-26 NOTE — Telephone Encounter (Signed)
 Referral: -pt called to request a referral to a lymphedema clinic in [place] that she was able to get into earlier @ [facility] -Referral in progress.  Pt aware

## 2023-08-02 ENCOUNTER — Other Ambulatory Visit (HOSPITAL_BASED_OUTPATIENT_CLINIC_OR_DEPARTMENT_OTHER): Payer: Self-pay

## 2023-08-11 ENCOUNTER — Other Ambulatory Visit (INDEPENDENT_AMBULATORY_CARE_PROVIDER_SITE_OTHER): Payer: Self-pay | Admitting: Family Medicine

## 2023-08-11 DIAGNOSIS — E538 Deficiency of other specified B group vitamins: Secondary | ICD-10-CM

## 2023-08-13 ENCOUNTER — Ambulatory Visit (INDEPENDENT_AMBULATORY_CARE_PROVIDER_SITE_OTHER): Payer: 59 | Admitting: Podiatry

## 2023-08-13 ENCOUNTER — Encounter: Payer: Self-pay | Admitting: Podiatry

## 2023-08-13 DIAGNOSIS — D2372 Other benign neoplasm of skin of left lower limb, including hip: Secondary | ICD-10-CM

## 2023-08-13 NOTE — Progress Notes (Signed)
  Subjective:  Patient ID: Kimberly Castro, female    DOB: 16-Feb-1961,   MRN: [id number]  Chief Complaint  Patient presents with   Plantar Warts    63 y.o. female presents for concern of lesion on the bottom of her left foot that has been present for about a year. She relates it has recently been worsening and causing a lot of pain. She has tried Compound W on the area but wasn't really helping . Denies any other pedal complaints. Denies n/v/f/c.   Past Medical History:  Diagnosis Date   ADD (attention deficit disorder)    Anemia    Anxiety    Aortic atherosclerosis (HCC) 03/09/2023   B12 deficiency    Back pain    CHF (congestive heart failure) (HCC)    Chronic fatigue    CKD (chronic kidney disease), stage III (HCC)    Complication of anesthesia    during colonoscopy BP dropped very low   Constipation    Depression    Depression    Fatigue 06/10/2020   Fatty liver    Fibromyalgia    Headache    Heart murmur    no problems with it   Hypercholesteremia    Hypertension    Hypoglycemia    IBS (irritable bowel syndrome)    IBS (irritable bowel syndrome)    Osteopenia    Palpitations 03/31/2016   anxiety related   Shortness of breath 03/09/2023   Sleep apnea    uses cpap   SOB (shortness of breath)    Statin intolerance 03/09/2023    Objective:  Physical Exam: Vascular: DP/PT pulses 2/4 bilateral. CFT <3 seconds. Normal hair growth on digits. No edema.  Skin. No lacerations or abrasions bilateral feet. Hyperkeratotic cored lesion noted to plantar fifth metatarsal head on the left.  Musculoskeletal: MMT 5/5 bilateral lower extremities in DF, PF, Inversion and Eversion. Deceased ROM in DF of ankle joint.  Neurological: Sensation intact to light touch.   Assessment:   1. Benign neoplasm of skin of foot, left      Plan:  Patient was evaluated and treated and all questions answered. -Discussed benign skin lesions with patient and treatment options.   -Hyperkeratotic tissue was debrided with chisel without incident.  -Applied salycylic acid treatment to area with dressing. Advised to remove bandaging tomorrow.  -Encouraged daily moisturizing -Discussed use of pumice stone -Advised good supportive shoes and inserts -Patient to return to office as needed or sooner if condition worsens.   Louann Sjogren, DPM

## 2023-08-16 ENCOUNTER — Telehealth: Payer: Self-pay | Admitting: Pharmacist

## 2023-08-16 NOTE — Telephone Encounter (Signed)
 Pharmacy Patient Advocate Encounter  Received notification from [facility] that Prior Authorization for Qulipta 60MG  tablets has been APPROVED from 08/16/2023 to 08/15/2024   PA #/Case ID/Reference #:  [id number]

## 2023-08-16 NOTE — Telephone Encounter (Signed)
 Pharmacy Patient Advocate Encounter   Received notification from CoverMyMeds that prior authorization for Qulipta 60MG  tablets is required/requested.   Insurance verification completed.   The patient is insured through [facility] .   Per test claim: PA required; PA submitted to above mentioned insurance via CoverMyMeds Key/confirmation #/EOC [id number] Status is pending

## 2023-09-10 NOTE — Progress Notes (Unsigned)
 Cardiology Office Note:  .   Date:  09/11/2023  ID:  Kimberly Castro, DOB 06/21/1960, MRN [id number] PCP: Linus Galas, NP  [facility] Providers Cardiologist:  Chilton Si, MD    History of Present Illness: .   Kimberly Castro is a 63 y.o. female with history of OSA on CPAP, HLD, HTN, fibromyalgia.   Established 03/2016 for palpitations with 14 day monitor showing PAC/PVC. TSH normal. D-dimer elevated with CTA negative for PE. Echo 03/2017 normal LVEF 55-60%. Myoview 04/2017 due to dyspnea normal LVEF 55%, breast attenuation artifact, no ischemia.   Repeat Myoview 05/2019 due to exertional dyspnea LVEF 60%, no ischemia.  Echo 05/2019 LVEF 61%, grade 2 diastolic dysfunction, RA pressure 8 mmHg.  Seen by Dr. Duke Salvia 03/09/2023 struggling with weight gain after discontinuation of Mounjaro due to cost. Recommended to continue to follow with weight loss clinic. Lasix changed to daily dosing due to LE edema. Hydrochlorothiazide stopped, Valsartan 160mg  daily initiated.   At visit 06/08/23 potassium stopped and Spironolactone 12.5mg  initiated for edema. Had seen [facility] with  venous reflux study demonstrating reflux in proximal GSSV and common femoral although not responsible for edema. She was referred to lymphedema clinic by [facility].   Presents today for follow up. 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 Has had more persistent swelling in bilateral legs up to her thighs. Had to spend most of the weekend with her legs up in the bed. Lymphedema clinic has ordered her compression capris as well as lymphedema pumps. Reports no shortness of breath at rest and stable mild dyspnea on exertion. Reports no chest pain, pressure, or tightness. No orthopnea, PND. Reports no palpitations.    ROS: Please see the history of present illness.    All other systems reviewed and are negative.   Studies Reviewed: .            Risk Assessment/Calculations:     HYPERTENSION CONTROL Vitals:   09/11/23 0759 09/11/23 0817  BP: (!) 156/78 (!) 142/80    The patient's blood pressure is elevated above target today.  In order to address the patient's elevated BP: Blood pressure will be monitored at home to determine if medication changes need to be made.; Labs and/or other diagnostics are currently pending prior to making blood pressure medication adjustments.          Physical Exam:   VS:  BP (!) 142/80   Pulse 96   Ht 5\' 5"  (1.651 m)   Wt (!) 343 lb 14.4 oz (156 kg)   SpO2 96%   BMI 57.23 kg/m    Wt Readings from Last 3 Encounters:  09/11/23 (!) 343 lb 14.4 oz (156 kg)  06/08/23 (!) 345 lb 11.2 oz (156.8 kg)  06/08/23 (!) 343 lb (155.6 kg)    GEN: Well nourished, overweight, well developed in no acute distress NECK: No JVD; No carotid bruits CARDIAC: RRR, no murmurs, rubs, gallops RESPIRATORY:  Clear to auscultation without rales, wheezing or rhonchi  ABDOMEN: Soft, non-tender, non-distended EXTREMITIES:  bilateral 2+ pitting edema; No deformity   ASSESSMENT AND PLAN: .    LE edema / Venous insufficiency / Lymphedema - Echo 03/2023 normal LVEF, indeterminate diastolic parameters. LE edema multifactorial lymphedema, venous insufficiency. Followed by lymphedema clinic.  HTN - BP elevated in clinic. Encouraged to check at home. CMET today, if K stable increase Spironolactone from 12.5mg  to 25mg . In the interim, continue  Lopressor 25mg  BID, Valsartan 150mg  daily. Refills provided.  Mild AS - By echo 03/2023. Repeat echo 03/2024 already ordered. Continue optimal BP control.   Hypokalemia - Potassium previously switched to spironolactone. Update CMET.  Morbid obesity - Prior gastric bypass 20 years ago.  Mounjaro stoppe ddue to cost issues. Continue to follow with weight loss clinic.  HLD / Aortic atherosclerosis - Continue Repatha 140mg  q14 days, Metoprolol tartrate 25mg  BID. Update CMET, lipid.    PAC/PVC - Quiescent on Lopressor 25mg  BID, continue same.       Dispo: follow up in 6 months  Signed, Alver Sorrow, NP

## 2023-09-11 ENCOUNTER — Ambulatory Visit (INDEPENDENT_AMBULATORY_CARE_PROVIDER_SITE_OTHER): Payer: 59 | Admitting: Family

## 2023-09-11 ENCOUNTER — Encounter (HOSPITAL_BASED_OUTPATIENT_CLINIC_OR_DEPARTMENT_OTHER): Payer: Self-pay | Admitting: Family

## 2023-09-11 VITALS — BP 142/80 | HR 96 | Ht 65.0 in | Wt 343.9 lb

## 2023-09-11 DIAGNOSIS — I700 Atherosclerosis of aorta: Secondary | ICD-10-CM | POA: Diagnosis not present

## 2023-09-11 DIAGNOSIS — E782 Mixed hyperlipidemia: Secondary | ICD-10-CM

## 2023-09-11 DIAGNOSIS — I350 Nonrheumatic aortic (valve) stenosis: Secondary | ICD-10-CM

## 2023-09-11 DIAGNOSIS — I10 Essential (primary) hypertension: Secondary | ICD-10-CM | POA: Diagnosis not present

## 2023-09-11 LAB — COMPREHENSIVE METABOLIC PANEL
ALT: 22 IU/L (ref 0–32)
AST: 27 IU/L (ref 0–40)
Albumin: 4.2 g/dL (ref 3.9–4.9)
Alkaline Phosphatase: 115 IU/L (ref 44–121)
BUN/Creatinine Ratio: 21 (ref 12–28)
BUN: 20 mg/dL (ref 8–27)
Bilirubin Total: 0.2 mg/dL (ref 0.0–1.2)
CO2: 24 mmol/L (ref 20–29)
Calcium: 8.5 mg/dL — ABNORMAL LOW (ref 8.7–10.3)
Chloride: 105 mmol/L (ref 96–106)
Creatinine, Ser: 0.96 mg/dL (ref 0.57–1.00)
Globulin, Total: 2.6 g/dL (ref 1.5–4.5)
Glucose: 91 mg/dL (ref 70–99)
Potassium: 4.9 mmol/L (ref 3.5–5.2)
Sodium: 142 mmol/L (ref 134–144)
Total Protein: 6.8 g/dL (ref 6.0–8.5)
eGFR: 67 mL/min/{1.73_m2} (ref >59)

## 2023-09-11 LAB — LIPID PANEL
Chol/HDL Ratio: 2.2 ratio (ref 0.0–4.4)
Cholesterol, Total: 213 mg/dL — ABNORMAL HIGH (ref 100–199)
HDL: 96 mg/dL (ref >39)
LDL Chol Calc (NIH): 98 mg/dL (ref 0–99)
Triglycerides: 110 mg/dL (ref 0–149)
VLDL Cholesterol Cal: 19 mg/dL (ref 5–40)

## 2023-09-11 MED ORDER — VALSARTAN 160 MG PO TABS: 160.0000 mg | ORAL_TABLET | Freq: Every day | ORAL | 3 refills | Status: DC

## 2023-09-11 MED ORDER — REPATHA SURECLICK 140 MG/ML ~~LOC~~ SOAJ
140.0000 mg | SUBCUTANEOUS | 6 refills | Status: DC
Start: 2023-09-11 — End: 2024-04-30

## 2023-09-11 MED ORDER — EZETIMIBE 10 MG PO TABS
10.0000 mg | ORAL_TABLET | Freq: Every day | ORAL | 3 refills | Status: DC
Start: 2023-09-11 — End: 2023-12-29

## 2023-09-11 MED ORDER — METOPROLOL TARTRATE 25 MG PO TABS
25.0000 mg | ORAL_TABLET | Freq: Two times a day (BID) | ORAL | 3 refills | Status: DC
Start: 2023-09-11 — End: 2023-09-25

## 2023-09-11 NOTE — Patient Instructions (Signed)
 Medication Instructions:  Your physician recommends that you continue on your current medications as directed. Please refer to the Current Medication list given to you today.  WE MAY CHANGE SPIRO DOSE BASED ON LABS  *If you need a refill on your cardiac medications before your next appointment, please call your pharmacy*   Lab Work: LIPID PANEL & CMP TODAY   Follow-Up: At [facility], you and your health needs are our priority.  As part of our continuing mission to provide you with exceptional heart care, we have created designated Provider Care Teams.  These Care Teams include your primary Cardiologist (physician) and Advanced Practice Providers (APPs -  Physician Assistants and Nurse Practitioners) who all work together to provide you with the care you need, when you need it.  We recommend signing up for the patient portal called "MyChart".  Sign up information is provided on this After Visit Summary.  MyChart is used to connect with patients for Virtual Visits (Telemedicine).  Patients are able to view lab/test results, encounter notes, upcoming appointments, etc.  Non-urgent messages can be sent to your provider as well.   To learn more about what you can do with MyChart, go to [email or web address].    Your next appointment:   6 MONTHS WITH DR. Renfrow OR Gillian Shields, NP

## 2023-09-12 ENCOUNTER — Telehealth (HOSPITAL_BASED_OUTPATIENT_CLINIC_OR_DEPARTMENT_OTHER): Payer: Self-pay

## 2023-09-12 ENCOUNTER — Encounter (HOSPITAL_BASED_OUTPATIENT_CLINIC_OR_DEPARTMENT_OTHER): Payer: Self-pay

## 2023-09-12 DIAGNOSIS — E782 Mixed hyperlipidemia: Secondary | ICD-10-CM

## 2023-09-12 MED ORDER — NEXLIZET 180-10 MG PO TABS: 1.0000 | ORAL_TABLET | Freq: Every day | ORAL | 11 refills | Status: DC

## 2023-09-12 NOTE — Telephone Encounter (Signed)
-----   Message from Alver Sorrow sent at 09/12/2023  9:46 AM EDT ----- Normal kidney, liver function. Potassium at upper limit of normal, would recommend continuing Spironolactone 12.5mg  daily.   Cholesterol much improved from previous (LDL was 150 now down to 98). Not quite at goal <70. If agreeable, recommend addition of Nexlizet one tablet daily. This is a non-statin medication to help further lower cholesterol. If she would like to discuss further, can mai lher information or set her up with PHarmD appointment.  If nexlizet added, repeat CMET/lipid panel in 3 mos

## 2023-09-18 ENCOUNTER — Telehealth: Payer: Self-pay

## 2023-09-18 ENCOUNTER — Other Ambulatory Visit (HOSPITAL_COMMUNITY): Payer: Self-pay

## 2023-09-18 NOTE — Telephone Encounter (Signed)
 Pharmacy Patient Advocate Encounter  Received notification from [facility] that Prior Authorization for NEXLIZET has been APPROVED from 09/18/23 to 03/19/24. Ran test claim, Copay is $0. This test claim was processed through [facility]- copay amounts may vary at other pharmacies due to pharmacy/plan contracts, or as the patient moves through the different stages of their insurance plan.

## 2023-09-18 NOTE — Telephone Encounter (Signed)
 Pharmacy Patient Advocate Encounter   Received notification from CoverMyMeds that prior authorization for NEXLIZET is required/requested.   Insurance verification completed.   The patient is insured through [facility] .   Per test claim: PA required; PA submitted to above mentioned insurance via CoverMyMeds Key/confirmation #/EOC [id number] Status is pending

## 2023-09-20 ENCOUNTER — Telehealth (HOSPITAL_BASED_OUTPATIENT_CLINIC_OR_DEPARTMENT_OTHER): Payer: Self-pay | Admitting: Family

## 2023-09-20 NOTE — Telephone Encounter (Signed)
 New Message:       Patient is calling to  check on the status of the machine that she is supposed to be getting,. She says it helps to compress the fluid out of her legs. She said the company said they had sent for the request, but had not heard from our office.

## 2023-09-20 NOTE — Telephone Encounter (Signed)
 Called and spoke to patient. Patient verified with 2 identifiers (DOB and Last Name)  Patient's concerns: Patient states she could not sent up a time for her lymphedema pumps due to not having an order.   Nurse's Recommendations: Advised that lymphedema clinic should have order since they have been following up due to it being a lymphedema pump for her leg. Only referral office had sent in was for elastic therapy for her compressions stockings. Recommended that pt reach out to lymphedema clinic for clarification and follow up on pump order.   Patient verbalized understanding.

## 2023-09-25 ENCOUNTER — Other Ambulatory Visit: Payer: Self-pay

## 2023-09-25 DIAGNOSIS — I350 Nonrheumatic aortic (valve) stenosis: Secondary | ICD-10-CM

## 2023-09-25 DIAGNOSIS — I10 Essential (primary) hypertension: Secondary | ICD-10-CM

## 2023-09-25 DIAGNOSIS — I700 Atherosclerosis of aorta: Secondary | ICD-10-CM

## 2023-09-25 MED ORDER — METOPROLOL TARTRATE 25 MG PO TABS
25.0000 mg | ORAL_TABLET | Freq: Two times a day (BID) | ORAL | 3 refills | Status: DC
Start: 2023-09-25 — End: 2023-10-12

## 2023-09-25 MED ORDER — VALSARTAN 160 MG PO TABS: 160.0000 mg | ORAL_TABLET | Freq: Every day | ORAL | 3 refills | Status: DC

## 2023-09-25 MED ORDER — SPIRONOLACTONE 25 MG PO TABS: 12.5000 mg | ORAL_TABLET | Freq: Every day | ORAL | 3 refills | Status: DC

## 2023-10-12 ENCOUNTER — Telehealth (HOSPITAL_BASED_OUTPATIENT_CLINIC_OR_DEPARTMENT_OTHER): Payer: Self-pay

## 2023-10-12 DIAGNOSIS — I700 Atherosclerosis of aorta: Secondary | ICD-10-CM

## 2023-10-12 DIAGNOSIS — I10 Essential (primary) hypertension: Secondary | ICD-10-CM

## 2023-10-12 DIAGNOSIS — I350 Nonrheumatic aortic (valve) stenosis: Secondary | ICD-10-CM

## 2023-10-12 MED ORDER — SPIRONOLACTONE 25 MG PO TABS: 12.5000 mg | ORAL_TABLET | Freq: Every day | ORAL | 3 refills | Status: DC

## 2023-10-12 MED ORDER — METOPROLOL TARTRATE 25 MG PO TABS: 25.0000 mg | ORAL_TABLET | Freq: Two times a day (BID) | ORAL | 3 refills | Status: AC

## 2023-10-12 NOTE — Telephone Encounter (Signed)
 Received fax for refills of Metoprolol  & Spiro. Pt last seen 09/11/23 w/ Neomi Banks, NP- recommended for 9mo follow up. Refill appropriate- rx to pharm sent!

## 2023-10-24 ENCOUNTER — Other Ambulatory Visit: Payer: Self-pay | Admitting: Adult Health

## 2023-12-05 ENCOUNTER — Other Ambulatory Visit (HOSPITAL_BASED_OUTPATIENT_CLINIC_OR_DEPARTMENT_OTHER): Payer: Self-pay | Admitting: Family

## 2023-12-28 ENCOUNTER — Encounter (HOSPITAL_BASED_OUTPATIENT_CLINIC_OR_DEPARTMENT_OTHER): Payer: Self-pay | Admitting: Emergency Medicine

## 2023-12-28 ENCOUNTER — Inpatient Hospital Stay (HOSPITAL_BASED_OUTPATIENT_CLINIC_OR_DEPARTMENT_OTHER)
Admission: EM | Admit: 2023-12-28 | Discharge: 2024-01-01 | DRG: 417 | Disposition: A | Discharge status: Home / Self Care | Attending: Internal Medicine | Admitting: Internal Medicine

## 2023-12-28 ENCOUNTER — Emergency Department (HOSPITAL_BASED_OUTPATIENT_CLINIC_OR_DEPARTMENT_OTHER)

## 2023-12-28 ENCOUNTER — Other Ambulatory Visit: Payer: Self-pay

## 2023-12-28 DIAGNOSIS — M8580 Other specified disorders of bone density and structure, unspecified site: Secondary | ICD-10-CM | POA: Diagnosis present

## 2023-12-28 DIAGNOSIS — F32A Depression, unspecified: Secondary | ICD-10-CM | POA: Diagnosis present

## 2023-12-28 DIAGNOSIS — E8809 Other disorders of plasma-protein metabolism, not elsewhere classified: Secondary | ICD-10-CM | POA: Diagnosis present

## 2023-12-28 DIAGNOSIS — Z6841 Body Mass Index (BMI) 40.0 and over, adult: Secondary | ICD-10-CM | POA: Diagnosis not present

## 2023-12-28 DIAGNOSIS — G43909 Migraine, unspecified, not intractable, without status migrainosus: Secondary | ICD-10-CM | POA: Diagnosis present

## 2023-12-28 DIAGNOSIS — E7800 Pure hypercholesterolemia, unspecified: Secondary | ICD-10-CM | POA: Diagnosis present

## 2023-12-28 DIAGNOSIS — K851 Biliary acute pancreatitis without necrosis or infection: Principal | ICD-10-CM | POA: Diagnosis present

## 2023-12-28 DIAGNOSIS — K8042 Calculus of bile duct with acute cholecystitis without obstruction: Secondary | ICD-10-CM | POA: Diagnosis not present

## 2023-12-28 DIAGNOSIS — N183 Chronic kidney disease, stage 3 unspecified: Secondary | ICD-10-CM | POA: Diagnosis present

## 2023-12-28 DIAGNOSIS — K760 Fatty (change of) liver, not elsewhere classified: Secondary | ICD-10-CM | POA: Diagnosis present

## 2023-12-28 DIAGNOSIS — R7881 Bacteremia: Secondary | ICD-10-CM | POA: Diagnosis present

## 2023-12-28 DIAGNOSIS — Z841 Family history of disorders of kidney and ureter: Secondary | ICD-10-CM

## 2023-12-28 DIAGNOSIS — R7989 Other specified abnormal findings of blood chemistry: Secondary | ICD-10-CM | POA: Diagnosis not present

## 2023-12-28 DIAGNOSIS — F419 Anxiety disorder, unspecified: Secondary | ICD-10-CM | POA: Diagnosis present

## 2023-12-28 DIAGNOSIS — F988 Other specified behavioral and emotional disorders with onset usually occurring in childhood and adolescence: Secondary | ICD-10-CM | POA: Diagnosis present

## 2023-12-28 DIAGNOSIS — K8050 Calculus of bile duct without cholangitis or cholecystitis without obstruction: Secondary | ICD-10-CM | POA: Diagnosis not present

## 2023-12-28 DIAGNOSIS — Z79899 Other long term (current) drug therapy: Secondary | ICD-10-CM | POA: Diagnosis not present

## 2023-12-28 DIAGNOSIS — R1084 Generalized abdominal pain: Secondary | ICD-10-CM | POA: Diagnosis present

## 2023-12-28 DIAGNOSIS — Z87891 Personal history of nicotine dependence: Secondary | ICD-10-CM

## 2023-12-28 DIAGNOSIS — I5032 Chronic diastolic (congestive) heart failure: Secondary | ICD-10-CM | POA: Diagnosis present

## 2023-12-28 DIAGNOSIS — G4733 Obstructive sleep apnea (adult) (pediatric): Secondary | ICD-10-CM | POA: Diagnosis present

## 2023-12-28 DIAGNOSIS — Z9884 Bariatric surgery status: Secondary | ICD-10-CM

## 2023-12-28 DIAGNOSIS — I2510 Atherosclerotic heart disease of native coronary artery without angina pectoris: Secondary | ICD-10-CM | POA: Diagnosis present

## 2023-12-28 DIAGNOSIS — I130 Hypertensive heart and chronic kidney disease with heart failure and stage 1 through stage 4 chronic kidney disease, or unspecified chronic kidney disease: Secondary | ICD-10-CM | POA: Diagnosis present

## 2023-12-28 DIAGNOSIS — K8062 Calculus of gallbladder and bile duct with acute cholecystitis without obstruction: Principal | ICD-10-CM | POA: Diagnosis present

## 2023-12-28 DIAGNOSIS — M1990 Unspecified osteoarthritis, unspecified site: Secondary | ICD-10-CM | POA: Diagnosis present

## 2023-12-28 DIAGNOSIS — R011 Cardiac murmur, unspecified: Secondary | ICD-10-CM | POA: Diagnosis present

## 2023-12-28 DIAGNOSIS — M797 Fibromyalgia: Secondary | ICD-10-CM | POA: Diagnosis present

## 2023-12-28 DIAGNOSIS — J309 Allergic rhinitis, unspecified: Secondary | ICD-10-CM | POA: Diagnosis present

## 2023-12-28 DIAGNOSIS — Z8249 Family history of ischemic heart disease and other diseases of the circulatory system: Secondary | ICD-10-CM

## 2023-12-28 DIAGNOSIS — K8063 Calculus of gallbladder and bile duct with acute cholecystitis with obstruction: Secondary | ICD-10-CM | POA: Diagnosis not present

## 2023-12-28 DIAGNOSIS — Z888 Allergy status to other drugs, medicaments and biological substances status: Secondary | ICD-10-CM

## 2023-12-28 DIAGNOSIS — E538 Deficiency of other specified B group vitamins: Secondary | ICD-10-CM | POA: Diagnosis present

## 2023-12-28 DIAGNOSIS — R5382 Chronic fatigue, unspecified: Secondary | ICD-10-CM | POA: Diagnosis present

## 2023-12-28 DIAGNOSIS — I700 Atherosclerosis of aorta: Secondary | ICD-10-CM | POA: Diagnosis present

## 2023-12-28 DIAGNOSIS — Z01810 Encounter for preprocedural cardiovascular examination: Secondary | ICD-10-CM | POA: Diagnosis not present

## 2023-12-28 DIAGNOSIS — E66813 Obesity, class 3: Secondary | ICD-10-CM | POA: Diagnosis present

## 2023-12-28 DIAGNOSIS — Z833 Family history of diabetes mellitus: Secondary | ICD-10-CM | POA: Diagnosis not present

## 2023-12-28 DIAGNOSIS — Z83438 Family history of other disorder of lipoprotein metabolism and other lipidemia: Secondary | ICD-10-CM

## 2023-12-28 DIAGNOSIS — B954 Other streptococcus as the cause of diseases classified elsewhere: Secondary | ICD-10-CM | POA: Diagnosis present

## 2023-12-28 DIAGNOSIS — K589 Irritable bowel syndrome without diarrhea: Secondary | ICD-10-CM | POA: Diagnosis present

## 2023-12-28 DIAGNOSIS — R1013 Epigastric pain: Secondary | ICD-10-CM

## 2023-12-28 DIAGNOSIS — Z8719 Personal history of other diseases of the digestive system: Secondary | ICD-10-CM

## 2023-12-28 DIAGNOSIS — Z90711 Acquired absence of uterus with remaining cervical stump: Secondary | ICD-10-CM

## 2023-12-28 LAB — COMPREHENSIVE METABOLIC PANEL WITH GFR
ALT: 228 U/L — ABNORMAL HIGH (ref 0–44)
AST: 150 U/L — ABNORMAL HIGH (ref 15–41)
Albumin: 4 g/dL (ref 3.5–5.0)
Alkaline Phosphatase: 384 U/L — ABNORMAL HIGH (ref 38–126)
Anion gap: 14 (ref 5–15)
BUN: 20 mg/dL (ref 8–23)
CO2: 24 mmol/L (ref 22–32)
Calcium: 8.6 mg/dL — ABNORMAL LOW (ref 8.9–10.3)
Chloride: 99 mmol/L (ref 98–111)
Creatinine, Ser: 1.06 mg/dL — ABNORMAL HIGH (ref 0.44–1.00)
GFR, Estimated: 59 mL/min — ABNORMAL LOW (ref >60)
Glucose, Bld: 146 mg/dL — ABNORMAL HIGH (ref 70–99)
Potassium: 4 mmol/L (ref 3.5–5.1)
Sodium: 137 mmol/L (ref 135–145)
Total Bilirubin: 3 mg/dL — ABNORMAL HIGH (ref 0.0–1.2)
Total Protein: 7.7 g/dL (ref 6.5–8.1)

## 2023-12-28 LAB — CBC WITH DIFFERENTIAL/PLATELET
Abs Immature Granulocytes: 0.17 K/uL — ABNORMAL HIGH (ref 0.00–0.07)
Basophils Absolute: 0 K/uL (ref 0.0–0.1)
Basophils Relative: 0 %
Eosinophils Absolute: 0 K/uL (ref 0.0–0.5)
Eosinophils Relative: 0 %
HCT: 37.2 % (ref 36.0–46.0)
Hemoglobin: 11.8 g/dL — ABNORMAL LOW (ref 12.0–15.0)
Immature Granulocytes: 1 %
Lymphocytes Relative: 3 %
Lymphs Abs: 0.6 K/uL — ABNORMAL LOW (ref 0.7–4.0)
MCH: 25 pg — ABNORMAL LOW (ref 26.0–34.0)
MCHC: 31.7 g/dL (ref 30.0–36.0)
MCV: 78.8 fL — ABNORMAL LOW (ref 80.0–100.0)
Monocytes Absolute: 1.2 K/uL — ABNORMAL HIGH (ref 0.1–1.0)
Monocytes Relative: 6 %
Neutro Abs: 19.8 K/uL — ABNORMAL HIGH (ref 1.7–7.7)
Neutrophils Relative %: 90 %
Platelets: 344 K/uL (ref 150–400)
RBC: 4.72 MIL/uL (ref 3.87–5.11)
RDW: 19.9 % — ABNORMAL HIGH (ref 11.5–15.5)
WBC: 21.8 K/uL — ABNORMAL HIGH (ref 4.0–10.5)
nRBC: 0 % (ref 0.0–0.2)

## 2023-12-28 LAB — URINALYSIS, ROUTINE W REFLEX MICROSCOPIC
Bacteria, UA: NONE SEEN
Glucose, UA: NEGATIVE mg/dL
Ketones, ur: NEGATIVE mg/dL
Nitrite: NEGATIVE
Protein, ur: 30 mg/dL — AB
Specific Gravity, Urine: >1.046 — ABNORMAL HIGH (ref 1.005–1.030)
pH: 6 (ref 5.0–8.0)

## 2023-12-28 LAB — LIPASE, BLOOD: Lipase: 261 U/L — ABNORMAL HIGH (ref 11–51)

## 2023-12-28 MED ORDER — FLUTICASONE PROPIONATE 50 MCG/ACT NA SUSP
1.0000 | Freq: Every day | NASAL | Status: DC
  Administered 2023-12-29 – 2024-01-01 (×4): 1 via NASAL
  Filled 2023-12-28: qty 16

## 2023-12-28 MED ORDER — ATOGEPANT 60 MG PO TABS: 1.0000 | ORAL_TABLET | Freq: Every day | ORAL | Status: DC

## 2023-12-28 MED ORDER — ONDANSETRON HCL 4 MG/2ML IJ SOLN: 4.0000 mg | Freq: Four times a day (QID) | INTRAMUSCULAR | Status: DC | PRN

## 2023-12-28 MED ORDER — ENOXAPARIN SODIUM 40 MG/0.4ML IJ SOSY
40.0000 mg | PREFILLED_SYRINGE | INTRAMUSCULAR | Status: DC
  Administered 2023-12-28 – 2023-12-31 (×4): 40 mg via SUBCUTANEOUS
  Filled 2023-12-28 (×4): qty 0.4

## 2023-12-28 MED ORDER — SODIUM CHLORIDE 0.9 % IV SOLN
12.5000 mg | Freq: Once | INTRAVENOUS | Status: AC
  Administered 2023-12-28: 12.5 mg via INTRAVENOUS
  Filled 2023-12-28: qty 0.5

## 2023-12-28 MED ORDER — HYDROMORPHONE HCL 1 MG/ML IJ SOLN
0.5000 mg | Freq: Once | INTRAMUSCULAR | Status: AC
  Administered 2023-12-28: 0.5 mg via INTRAVENOUS
  Filled 2023-12-28: qty 1

## 2023-12-28 MED ORDER — PROMETHAZINE HCL 25 MG/ML IJ SOLN
INTRAMUSCULAR | Status: AC
  Filled 2023-12-28: qty 1

## 2023-12-28 MED ORDER — MORPHINE SULFATE (PF) 4 MG/ML IV SOLN
4.0000 mg | Freq: Once | INTRAVENOUS | Status: AC
  Administered 2023-12-28: 4 mg via INTRAVENOUS
  Filled 2023-12-28: qty 1

## 2023-12-28 MED ORDER — ACETAMINOPHEN 325 MG PO TABS
650.0000 mg | ORAL_TABLET | Freq: Four times a day (QID) | ORAL | Status: DC | PRN
  Administered 2023-12-29 (×2): 650 mg via ORAL
  Filled 2023-12-28 (×2): qty 2

## 2023-12-28 MED ORDER — METOPROLOL TARTRATE 25 MG PO TABS
25.0000 mg | ORAL_TABLET | Freq: Two times a day (BID) | ORAL | Status: DC
  Administered 2023-12-28 – 2023-12-31 (×6): 25 mg via ORAL
  Filled 2023-12-28 (×8): qty 1

## 2023-12-28 MED ORDER — PIPERACILLIN-TAZOBACTAM 3.375 G IVPB
3.3750 g | Freq: Three times a day (TID) | INTRAVENOUS | Status: DC
  Administered 2023-12-28 – 2023-12-31 (×8): 3.375 g via INTRAVENOUS
  Filled 2023-12-28 (×8): qty 50

## 2023-12-28 MED ORDER — ZOLPIDEM TARTRATE 5 MG PO TABS
5.0000 mg | ORAL_TABLET | Freq: Every evening | ORAL | Status: DC | PRN
  Administered 2023-12-29 – 2023-12-30 (×2): 5 mg via ORAL
  Filled 2023-12-28 (×3): qty 1

## 2023-12-28 MED ORDER — VITAMIN D 25 MCG (1000 UNIT) PO TABS
5000.0000 [IU] | ORAL_TABLET | Freq: Every day | ORAL | Status: DC
  Administered 2023-12-29 – 2024-01-01 (×4): 5000 [IU] via ORAL
  Filled 2023-12-28 (×3): qty 5

## 2023-12-28 MED ORDER — VENLAFAXINE HCL ER 75 MG PO CP24
225.0000 mg | ORAL_CAPSULE | Freq: Every day | ORAL | Status: DC
  Administered 2023-12-29 – 2024-01-01 (×4): 225 mg via ORAL
  Filled 2023-12-28 (×3): qty 3

## 2023-12-28 MED ORDER — ONDANSETRON HCL 4 MG/2ML IJ SOLN
4.0000 mg | Freq: Once | INTRAMUSCULAR | Status: AC
  Administered 2023-12-28: 4 mg via INTRAVENOUS
  Filled 2023-12-28: qty 2

## 2023-12-28 MED ORDER — LORATADINE 10 MG PO TABS
10.0000 mg | ORAL_TABLET | Freq: Every day | ORAL | Status: DC
  Administered 2023-12-29 – 2024-01-01 (×4): 10 mg via ORAL
  Filled 2023-12-28 (×3): qty 1

## 2023-12-28 MED ORDER — PIPERACILLIN-TAZOBACTAM 3.375 G IVPB 30 MIN
3.3750 g | Freq: Once | INTRAVENOUS | Status: AC
Start: 2023-12-28 — End: 2023-12-28
  Administered 2023-12-28: 3.375 g via INTRAVENOUS
  Filled 2023-12-28: qty 50

## 2023-12-28 MED ORDER — SODIUM CHLORIDE 0.9 % IV BOLUS
1000.0000 mL | Freq: Once | INTRAVENOUS | Status: AC
  Administered 2023-12-28: 1000 mL via INTRAVENOUS

## 2023-12-28 MED ORDER — SODIUM CHLORIDE 0.9 % IV SOLN: INTRAVENOUS | Status: DC

## 2023-12-28 MED ORDER — IRBESARTAN 150 MG PO TABS
150.0000 mg | ORAL_TABLET | Freq: Every day | ORAL | Status: DC
  Administered 2023-12-29: 150 mg via ORAL
  Filled 2023-12-28: qty 1

## 2023-12-28 MED ORDER — SPIRONOLACTONE 12.5 MG HALF TABLET
12.5000 mg | ORAL_TABLET | Freq: Every day | ORAL | Status: DC
  Administered 2023-12-29: 12.5 mg via ORAL
  Filled 2023-12-28: qty 1

## 2023-12-28 MED ORDER — IOHEXOL 300 MG/ML  SOLN
100.0000 mL | Freq: Once | INTRAMUSCULAR | Status: AC | PRN
  Administered 2023-12-28: 100 mL via INTRAVENOUS

## 2023-12-28 MED ORDER — ONDANSETRON HCL 4 MG PO TABS: 4.0000 mg | ORAL_TABLET | Freq: Four times a day (QID) | ORAL | Status: DC | PRN

## 2023-12-28 MED ORDER — BUPROPION HCL ER (XL) 300 MG PO TB24
300.0000 mg | ORAL_TABLET | Freq: Every day | ORAL | Status: DC
  Administered 2023-12-29 – 2024-01-01 (×4): 300 mg via ORAL
  Filled 2023-12-28 (×3): qty 1

## 2023-12-28 MED ORDER — HYDROMORPHONE HCL 1 MG/ML IJ SOLN
0.5000 mg | INTRAMUSCULAR | Status: DC | PRN
  Administered 2023-12-28 – 2023-12-29 (×3): 0.5 mg via INTRAVENOUS
  Filled 2023-12-28 (×3): qty 0.5

## 2023-12-28 MED ORDER — SENNOSIDES-DOCUSATE SODIUM 8.6-50 MG PO TABS: 1.0000 | ORAL_TABLET | Freq: Every evening | ORAL | Status: DC | PRN

## 2023-12-28 MED ORDER — ACETAMINOPHEN 650 MG RE SUPP: 650.0000 mg | Freq: Four times a day (QID) | RECTAL | Status: DC | PRN

## 2023-12-28 MED ORDER — PREGABALIN 75 MG PO CAPS
150.0000 mg | ORAL_CAPSULE | Freq: Two times a day (BID) | ORAL | Status: DC
  Administered 2023-12-28 – 2024-01-01 (×7): 150 mg via ORAL
  Filled 2023-12-28 (×7): qty 2

## 2023-12-28 MED ORDER — SODIUM CHLORIDE 0.9 % IV BOLUS
500.0000 mL | Freq: Once | INTRAVENOUS | Status: AC
  Administered 2023-12-28: 500 mL via INTRAVENOUS

## 2023-12-28 MED ORDER — GADOBUTROL 1 MMOL/ML IV SOLN
10.0000 mL | Freq: Once | INTRAVENOUS | Status: AC | PRN
  Administered 2023-12-28: 10 mL via INTRAVENOUS

## 2023-12-28 NOTE — H&P (Signed)
 History and Physical  MIOSHA BEHE FMW:[id number] DOB: 1961/05/27 DOA: 12/28/2023  PCP: Corlis Pagan, NP   Chief Complaint: Abdominal pain nausea and vomiting  HPI: Kimberly Castro is a 63 y.o. female with medical history significant for fibromyalgia, ADD, depression, HTN, sleep apnea, IBS, CKD, HLD, chronic diastolic HF, morbid obesity, hx SBO and gastric bypass, and anxiety who presented to [facility] for evaluation of abdominal pain, nausea and vomiting. Patient reports that around 2 AM this morning, she started having abdominal pain and nausea. She has generalized abdominal pain but worse in her epigastrium and right upper quadrant.  She reports subjective fevers and chill as well as dry heaving but no vomiting, chest pain, shortness of breath, dizziness, headache or dysuria.  Reports having a small bowel movement this morning. She also endorses some pain in her left knee due to recent left knee meniscal tear.  [facility] Course: Initial vitals show temp 98.3, RR 18, HR 76, BP 133/57, SpO2 100% on 3 L [place], wean off with SpO2 93% on room air. Initial labs significant for WBC 21.8, Hgb 11.8, blood glucose of 146, creatinine 1.06, alk phos 384, AST/ALT 150/228, bilirubin 3.0, lipase 261, UA with no signs of infection. CT A/P shows moderate gallbladder distention but no cholelithiasis. MRCP shows signs of acute cholecystitis and a 4 mm filling defect concerning for choledocholithiasis. Pt received patient received IV Zosyn , IV fluid hydration and multiple doses of pain and antiemetic medications with IV Dilaudid , IV morphine , IV Zofran  and IV Phenergan . General surgery was consulted for evaluation and recommended GI consult for ERCP.  Patient was admitted to [facility] service and transferred to [facility].  Review of Systems: Please see HPI for pertinent positives and negatives. A complete 10 system review of systems are otherwise negative.  Past Medical History:  Diagnosis Date   ADD  (attention deficit disorder)    Anemia    Anxiety    Aortic atherosclerosis (HCC) 03/09/2023   B12 deficiency    Back pain    CHF (congestive heart failure) (HCC)    Chronic fatigue    CKD (chronic kidney disease), stage III (HCC)    Complication of anesthesia    during colonoscopy BP dropped very low   Constipation    Depression    Depression    Fatigue 06/10/2020   Fatty liver    Fibromyalgia    Headache    Heart murmur    no problems with it   Hypercholesteremia    Hypertension    Hypoglycemia    IBS (irritable bowel syndrome)    IBS (irritable bowel syndrome)    Osteopenia    Palpitations 03/31/2016   anxiety related   Shortness of breath 03/09/2023   Sleep apnea    uses cpap   SOB (shortness of breath)    Statin intolerance 03/09/2023   Past Surgical History:  Procedure Laterality Date   ANTERIOR AND POSTERIOR REPAIR N/A 06/23/2019   Procedure: ANTERIOR (CYSTOCELE) AND POSTERIOR REPAIR (RECTOCELE);  Surgeon: Henry Slough, MD;  Location: [facility] OR;  Service: Gynecology;  Laterality: N/A;  3 hours   BLADDER SUSPENSION N/A 06/23/2019   Procedure: TRANSVAGINAL TAPE (TVT) PROCEDURE;  Surgeon: Henry Slough, MD;  Location: [facility] OR;  Service: Gynecology;  Laterality: N/A;   COLONOSCOPY     GASTRIC BYPASS     LAPAROSCOPY N/A 08/12/2016   Procedure: LAPAROSCOPY DIAGNOSTIC, POSSIBLE LAPAROTOMY, POSSIBLE BOWEL RESECTION;  Surgeon: Morene Olives, MD;  Location: [facility] OR;  Service: General;  Laterality: N/A;   PARTIAL HYSTERECTOMY     still has ovaries   TONSILLECTOMY     WISDOM TOOTH EXTRACTION     Social History:  reports that she has quit smoking. She has never used smokeless tobacco. She reports current alcohol use. She reports that she does not use drugs.  Allergies  Allergen Reactions   Lipitor [Atorvastatin ] Other (See Comments)    myalgias    Family History  Problem Relation Age of Onset   Kidney disease Mother    Heart failure Mother    COPD Mother    Atrial  fibrillation Mother    Kidney failure Mother    High blood pressure Mother    High Cholesterol Mother    Sleep apnea Mother    AAA (abdominal aortic aneurysm) Father    COPD Father    Heart disease Father    Heart failure Father    High Cholesterol Father    Sleep apnea Father    Peripheral Artery Disease Brother    COPD Brother    COPD Brother    Stroke Brother    Diabetes Brother    CAD Brother    Diabetes Maternal Grandmother    Heart disease Maternal Grandmother    Heart disease Maternal Grandfather    Diabetes Maternal Grandfather    Diabetes Paternal Grandmother    Stroke Paternal Grandmother    Heart disease Paternal Grandfather    Testicular cancer Son 27     Prior to Admission medications   Medication Sig Start Date End Date Taking? Authorizing Provider  amphetamine -dextroamphetamine  (ADDERALL XR) 30 MG 24 hr capsule Take 1 capsule (30 mg total) by mouth in the morning. (01-21-22) 01/21/22     Bempedoic Acid-Ezetimibe  (NEXLIZET ) 180-10 MG TABS Take 1 tablet by mouth daily. 09/12/23   Vannie Reche RAMAN, NP  buPROPion  (WELLBUTRIN  XL) 300 MG 24 hr tablet TAKE 1 TABLET(300 MG) BY MOUTH DAILY 09/30/20   Rhys Boyer T, PA-C  cetirizine (ZYRTEC) 10 MG tablet Take 10 mg by mouth daily.    [provider]  Cholecalciferol  125 MCG (5000 UT) capsule Take 5,000 Units by mouth daily.    [provider]  cyanocobalamin  (VITAMIN B12) 1000 MCG/ML injection Inject 1 mL (1,000 mcg total) into the muscle every 30 (thirty) days. 12/07/22   Bowen, Darice BRAVO, DO  denosumab (PROLIA) 60 MG/ML SOSY injection Inject 60 mg into the skin every 6 (six) months.    [provider]  ezetimibe  (ZETIA ) 10 MG tablet Take 1 tablet (10 mg total) by mouth daily. 09/11/23   Walker, Caitlin S, NP  fluticasone  (FLONASE ) 50 MCG/ACT nasal spray Place 1 spray into both nostrils daily.  02/25/16   [provider]  LYRICA  150 MG capsule Take 150 mg by mouth 2 (two) times daily. 05/29/23    [provider]  metoprolol  tartrate (LOPRESSOR ) 25 MG tablet Take 1 tablet (25 mg total) by mouth 2 (two) times daily. 10/12/23   Walker, Caitlin S, NP  QULIPTA  60 MG TABS TAKE 1 TABLET BY MOUTH EVERY DAY 10/25/23   Sherryl Bouchard, NP  REPATHA  SURECLICK 140 MG/ML SOAJ Inject 140 mg into the skin every 14 (fourteen) days. 09/11/23   Walker, Caitlin S, NP  spironolactone  (ALDACTONE ) 25 MG tablet Take 0.5 tablets (12.5 mg total) by mouth daily. 10/12/23   Walker, Caitlin S, NP  valsartan  (DIOVAN ) 160 MG tablet Take 1 tablet (160 mg total) by mouth daily. 09/25/23   Walker, Caitlin S, NP  Venlafaxine  HCl 225 MG TB24 Take 225 mg by mouth daily with breakfast.  01/23/16   [provider]  zolpidem  (AMBIEN ) 10 MG tablet Take 10 mg by mouth at bedtime as needed for sleep.  03/27/16   [provider]    Physical Exam: BP (!) 123/59   Pulse 96   Temp 100.1 F (37.8 C) (Oral)   Resp 16   Ht 5' 4 (1.626 m)   Wt (!) 145.2 kg   SpO2 92%   BMI 54.93 kg/m  General: Pleasant, acutely ill-appearing morbidly obese woman laying in bed. No acute distress. HEENT: [place]/AT. Anicteric sclera CV: Mild tachycardia. Regular rhythm. II/VI systolic murmur.  Pulmonary: Lungs CTAB. Normal effort. No wheezing or rales. Abdominal: Soft. Abdominal obesity. Moderate ttp of RUQ and epigastrium. Positive Murphy sign. No guarding. Normal bowel sounds. Extremities: Lymphedema of the lower extremities. Skin: Warm and dry. No obvious rash or lesions. Neuro: A&Ox3. Moves all extremities. Normal sensation to light touch. No focal deficit. Psych: Normal mood and affect          Labs on Admission:  Basic Metabolic Panel: Recent Labs  Lab 12/28/23 0947  NA 137  K 4.0  CL 99  CO2 24  GLUCOSE 146*  BUN 20  CREATININE 1.06*  CALCIUM  8.6*   Liver Function Tests: Recent Labs  Lab 12/28/23 0947  AST 150*  ALT 228*  ALKPHOS 384*  BILITOT 3.0*  PROT 7.7  ALBUMIN  4.0   Recent Labs  Lab  12/28/23 0947  LIPASE 261*   No results for input(s): AMMONIA in the last 168 hours. CBC: Recent Labs  Lab 12/28/23 0947  WBC 21.8*  NEUTROABS 19.8*  HGB 11.8*  HCT 37.2  MCV 78.8*  PLT 344   Cardiac Enzymes: No results for input(s): CKTOTAL, CKMB, CKMBINDEX, TROPONINI in the last 168 hours. BNP (last 3 results) Recent Labs    03/20/23 0811  BNP 76.8    ProBNP (last 3 results) No results for input(s): PROBNP in the last 8760 hours.  CBG: No results for input(s): GLUCAP in the last 168 hours.  Radiological Exams on Admission: MR ABDOMEN WITH MRCP W CONTRAST Result Date: 12/28/2023 CLINICAL DATA:  Right upper abdominal pain with gallbladder distension on same day CT EXAM: MRI ABDOMEN WITH CONTRAST (WITH MRCP) TECHNIQUE: Multiplanar multisequence MR imaging of the abdomen was performed following the administration of intravenous contrast. Heavily T2-weighted images of the biliary and pancreatic ducts were obtained, and three-dimensional MRCP images were rendered by post processing. CONTRAST:  10mL GADAVIST  GADOBUTROL  1 MMOL/ML IV SOLN COMPARISON:  Same day CT abdomen and pelvis FINDINGS: Postcontrast sequences are motion degraded. Lower chest: No acute findings.  Multichamber cardiomegaly. Hepatobiliary: No mass or other parenchymal abnormality identified. Markedly distended gallbladder contains innumerable stones. Mild gallbladder mural thickening and trace pericholecystic free fluid. The cystic duct appears dilated, measuring up to 1.7 cm. No dilation of the common bile duct, which appears to contain a 4 mm filling defect in the downstream duct (19:14). Pancreas: No mass, inflammatory changes, or other parenchymal abnormality identified. Spleen:  Enlarged spleen measures 15.2 cm. Adrenals/Urinary Tract: No adrenal nodules. No suspicious renal masses identified. No evidence of hydronephrosis. Bilateral subcentimeter T2 hyperintense simple cysts. No specific follow-up  imaging recommended. Stomach/Bowel: Postsurgical changes of Roux-en-Y gastric bypass. Small hiatal hernia. Vascular/Lymphatic: No pathologically enlarged lymph nodes identified. No abdominal aortic aneurysm demonstrated. Other:  None. Musculoskeletal: No suspicious bone lesions identified. IMPRESSION: 1. Markedly distended gallbladder contains innumerable stones with  mild gallbladder mural thickening and trace pericholecystic free fluid, suspicious for acute cholecystitis. 2. Nondilated common bile duct, which appears to contain a 4 mm filling defect in the downstream duct, suspicious for choledocholithiasis. 3. Splenomegaly. 4. Multichamber cardiomegaly. 5. Small hiatal hernia. Electronically Signed   By: Limin  Xu M.D.   On: 12/28/2023 16:57   CT ABDOMEN PELVIS W CONTRAST Result Date: 12/28/2023 CLINICAL DATA:  Acute generalized abdominal pain. EXAM: CT ABDOMEN AND PELVIS WITH CONTRAST TECHNIQUE: Multidetector CT imaging of the abdomen and pelvis was performed using the standard protocol following bolus administration of intravenous contrast. RADIATION DOSE REDUCTION: This exam was performed according to the departmental dose-optimization program which includes automated exposure control, adjustment of the mA and/or kV according to patient size and/or use of iterative reconstruction technique. CONTRAST:  OMNIPAQUE  IOHEXOL  300 MG/ML  SOLN COMPARISON:  January 30, 2020.  August 12, 2016. FINDINGS: Lower chest: No acute abnormality. Hepatobiliary: Moderate gallbladder distention is noted. No cholelithiasis or biliary dilatation is noted. Liver is unremarkable. Pancreas: Unremarkable. No pancreatic ductal dilatation or surrounding inflammatory changes. Spleen: Normal in size without focal abnormality. Adrenals/Urinary Tract: Adrenal glands are unremarkable. Kidneys are normal, without renal calculi, focal lesion, or hydronephrosis. Bladder is unremarkable. Stomach/Bowel: Small sliding-type hiatal hernia.  Status post gastric bypass. The appendix appears normal. There is no evidence of bowel obstruction or inflammation. Vascular/Lymphatic: Aortic atherosclerosis. No enlarged abdominal or pelvic lymph nodes. Reproductive: Status post hysterectomy. No adnexal masses. Other: No ascites or hernia is noted. Musculoskeletal: No acute or significant osseous findings. IMPRESSION: Moderate gallbladder distension. No cholelithiasis or inflammatory changes are noted. Small sliding-type hiatal hernia. Status post gastric bypass. Aortic Atherosclerosis (ICD10-I70.0). Electronically Signed   By: Lynwood Landy Raddle M.D.   On: 12/28/2023 11:38   Assessment/Plan Kimberly Castro is a 63 y.o. female with medical history significant for fibromyalgia, ADD, depression, HTN, sleep apnea, IBS, HLD, chronic diastolic HF, hx SBO and gastric bypass, and anxiety who presented to [facility] for evaluation of abdominal pain, nausea and vomiting and admitted for gallstone pancreatitis, cholecystitis and choledocholithiasis.  # Gallstone pancreatitis - Presented 1 day of epigastric pain, nausea, fevers and chills - Patient with elevated LFTs and lipase but no pancreatic inflammation on CT - MRCP shows evidence of cholelithiasis - Patient will benefit from cholecystectomy during this hospitalization - General Surgery following, appreciate recs - Pain control with PRN IV Dilaudid  and antiemetic with PRN IV Zofran  - Start IV NS 150 cc/h  # Acute cholecystitis # Cholelithiasis - Patient found to have imaging findings gallstones and acute cholecystitis - Patient with low-grade fever, transaminitis and significant leukocytosis - Positive Murphy's signs on abdominal exam - Continue IV Zosyn  - IV hydration as above - Trend CBC, fever curve  # Choledocholithiasis - MRCP shows nondilated CBD that contains 4 mm filling defect in the downstream duct suspicious for choledocholithiasis - CMP shows elevated LFTs, alkaline phosphate  and bilirubin - Patient reports she follows with Dr. Kristie in the outpatient - Pending GI (Dr. Kristie) consult in the morning for ERCP - Trend CMP  # HTN - BP elevated with SBP in the 130s to 160s - Continue metoprolol , spironolactone  and valsartan  (Irbesartan  as a substitute)  # Fibromyalgia - Continue Lyrica  and as needed Ambien   # Anxiety and depression - Continue bupropion  and Effexor   # OSA - CPAP at bedtime  # Allergic rhinitis - Continue loratadine  and Flonase   # Hx of migraines - Continue atogepant   # Morbid  obesity Body mass index is 54.93 kg/m. Filed Weights   12/28/23 0941 12/28/23 2037  Weight: (!) 145.2 kg (!) 145.2 kg  - F/u with PCP for weight lost and nutrition counseling  DVT prophylaxis: Lovenox      Code Status: Full Code  Consults called: General Surgery  Family Communication: Discussed admission with spouse at bedside  Severity of Illness: The appropriate patient status for this patient is INPATIENT. Inpatient status is judged to be reasonable and necessary in order to provide the required intensity of service to ensure the patient's safety. The patient's presenting symptoms, physical exam findings, and initial radiographic and laboratory data in the context of their chronic comorbidities is felt to place them at high risk for further clinical deterioration. Furthermore, it is not anticipated that the patient will be medically stable for discharge from the hospital within 2 midnights of admission.   * I certify that at the point of admission it is my clinical judgment that the patient will require inpatient hospital care spanning beyond 2 midnights from the point of admission due to high intensity of service, high risk for further deterioration and high frequency of surveillance required.*  Level of care: Telemetry   This record has been created using Conservation officer, historic buildings. Errors have been sought and corrected, but may not always be  located. Such creation errors do not reflect on the standard of care.   Lou Claretta HERO, MD 12/28/2023, 8:40 PM [facility] Pager: [phone] Isaiah 41:10   If 7PM-7AM, please contact night-coverage [email or web address] Password [email or web address]

## 2023-12-28 NOTE — ED Notes (Signed)
 Verbal order from Dr. Randol to send set of blood cultures that were drawn prior to antibiotic administration.

## 2023-12-28 NOTE — ED Notes (Signed)
 One set of blood cultures drawn from left [facility] and set aside prior to antibiotic administration.

## 2023-12-28 NOTE — ED Notes (Signed)
 Called [facility] to transport patient to [facility] 5E rm# [id number]

## 2023-12-28 NOTE — ED Notes (Signed)
 Fall risk bundle is currently in place.

## 2023-12-28 NOTE — ED Notes (Signed)
 Report called to Jodi, Charity fundraiser at [facility].

## 2023-12-28 NOTE — ED Provider Notes (Signed)
 [place] [facility] DEPARTMENT AT [facility] Provider Note   CSN: [id number] Arrival date & time: 12/28/23  9067     Patient presents with: Abdominal Pain, Nausea, and Emesis   Kimberly Castro is a 63 y.o. female.   Pt is a 63 yo female with pmhx significant for fibromyalgia, ADD, depression, HTN, sleep apnea, IBS, CKD, HLD, CHF, hx SBO and gastric bypass, and anxiety.  Pt presents to the ED today with abd pain and n/v.  Pt said she woke up from sleep around 0200 with sx.  She felt fine yesterday.  She did have a small BM this am.         Prior to Admission medications   Medication Sig Start Date End Date Taking? Authorizing Provider  amphetamine -dextroamphetamine  (ADDERALL XR) 30 MG 24 hr capsule Take 1 capsule (30 mg total) by mouth in the morning. (01-21-22) 01/21/22     Bempedoic Acid-Ezetimibe  (NEXLIZET ) 180-10 MG TABS Take 1 tablet by mouth daily. 09/12/23   Vannie Reche RAMAN, NP  buPROPion  (WELLBUTRIN  XL) 300 MG 24 hr tablet TAKE 1 TABLET(300 MG) BY MOUTH DAILY 09/30/20   Rhys Boyer T, PA-C  cetirizine (ZYRTEC) 10 MG tablet Take 10 mg by mouth daily.    [provider]  Cholecalciferol  125 MCG (5000 UT) capsule Take 5,000 Units by mouth daily.    [provider]  cyanocobalamin  (VITAMIN B12) 1000 MCG/ML injection Inject 1 mL (1,000 mcg total) into the muscle every 30 (thirty) days. 12/07/22   Bowen, Darice BRAVO, DO  denosumab (PROLIA) 60 MG/ML SOSY injection Inject 60 mg into the skin every 6 (six) months.    [provider]  ezetimibe  (ZETIA ) 10 MG tablet Take 1 tablet (10 mg total) by mouth daily. 09/11/23   Walker, Caitlin S, NP  fluticasone  (FLONASE ) 50 MCG/ACT nasal spray Place 1 spray into both nostrils daily.  02/25/16   [provider]  LYRICA  150 MG capsule Take 150 mg by mouth 2 (two) times daily. 05/29/23   [provider]  metoprolol  tartrate (LOPRESSOR ) 25 MG tablet Take 1 tablet (25 mg total) by mouth 2 (two) times  daily. 10/12/23   Walker, Caitlin S, NP  QULIPTA  60 MG TABS TAKE 1 TABLET BY MOUTH EVERY DAY 10/25/23   Sherryl Bouchard, NP  REPATHA  SURECLICK 140 MG/ML SOAJ Inject 140 mg into the skin every 14 (fourteen) days. 09/11/23   Walker, Caitlin S, NP  spironolactone  (ALDACTONE ) 25 MG tablet Take 0.5 tablets (12.5 mg total) by mouth daily. 10/12/23   Walker, Caitlin S, NP  valsartan  (DIOVAN ) 160 MG tablet Take 1 tablet (160 mg total) by mouth daily. 09/25/23   Vannie Reche RAMAN, NP  Venlafaxine  HCl 225 MG TB24 Take 225 mg by mouth daily with breakfast.  01/23/16   [provider]  zolpidem  (AMBIEN ) 10 MG tablet Take 10 mg by mouth at bedtime as needed for sleep.  03/27/16   [provider]    Allergies: Lipitor [atorvastatin ]    Review of Systems  Gastrointestinal:  Positive for abdominal pain, nausea and vomiting.  All other systems reviewed and are negative.   Updated Vital Signs BP (!) 160/53   Pulse 81   Temp 98.3 F (36.8 C) (Oral)   Resp 18   Ht 5' 4 (1.626 m)   Wt (!) 145.2 kg   SpO2 93%   BMI 54.93 kg/m   Physical Exam Vitals and nursing note reviewed.  Constitutional:      Appearance:  She is well-developed. She is obese.  HENT:     Head: Normocephalic and atraumatic.     Mouth/Throat:     Mouth: Mucous membranes are dry.     Pharynx: Oropharynx is clear.  Eyes:     Extraocular Movements: Extraocular movements intact.     Pupils: Pupils are equal, round, and reactive to light.  Cardiovascular:     Rate and Rhythm: Normal rate and regular rhythm.     Heart sounds: Normal heart sounds.  Pulmonary:     Effort: Pulmonary effort is normal.     Breath sounds: Normal breath sounds.  Abdominal:     General: Abdomen is flat. Bowel sounds are normal.     Palpations: Abdomen is soft.     Tenderness: There is abdominal tenderness in the right upper quadrant, epigastric area and left upper quadrant.  Skin:    General: Skin is warm.     Capillary Refill: Capillary  refill takes less than 2 seconds.  Neurological:     General: No focal deficit present.     Mental Status: She is alert and oriented to person, place, and time.  Psychiatric:        Mood and Affect: Mood normal.        Behavior: Behavior normal.     (all labs ordered are listed, but only abnormal results are displayed) Labs Reviewed  CBC WITH DIFFERENTIAL/PLATELET - Abnormal; Notable for the following components:      Result Value   WBC 21.8 (*)    Hemoglobin 11.8 (*)    MCV 78.8 (*)    MCH 25.0 (*)    RDW 19.9 (*)    Neutro Abs 19.8 (*)    Lymphs Abs 0.6 (*)    Monocytes Absolute 1.2 (*)    Abs Immature Granulocytes 0.17 (*)    All other components within normal limits  COMPREHENSIVE METABOLIC PANEL WITH GFR - Abnormal; Notable for the following components:   Glucose, Bld 146 (*)    Creatinine, Ser 1.06 (*)    Calcium  8.6 (*)    AST 150 (*)    ALT 228 (*)    Alkaline Phosphatase 384 (*)    Total Bilirubin 3.0 (*)    GFR, Estimated 59 (*)    All other components within normal limits  LIPASE, BLOOD - Abnormal; Notable for the following components:   Lipase 261 (*)    All other components within normal limits  URINALYSIS, ROUTINE W REFLEX MICROSCOPIC - Abnormal; Notable for the following components:   Specific Gravity, Urine >1.046 (*)    Hgb urine dipstick TRACE (*)    Bilirubin Urine SMALL (*)    Protein, ur 30 (*)    Leukocytes,Ua TRACE (*)    All other components within normal limits    EKG: None  Radiology: CT ABDOMEN PELVIS W CONTRAST Result Date: 12/28/2023 CLINICAL DATA:  Acute generalized abdominal pain. EXAM: CT ABDOMEN AND PELVIS WITH CONTRAST TECHNIQUE: Multidetector CT imaging of the abdomen and pelvis was performed using the standard protocol following bolus administration of intravenous contrast. RADIATION DOSE REDUCTION: This exam was performed according to the departmental dose-optimization program which includes automated exposure control, adjustment  of the mA and/or kV according to patient size and/or use of iterative reconstruction technique. CONTRAST:  OMNIPAQUE  IOHEXOL  300 MG/ML  SOLN COMPARISON:  January 30, 2020.  August 12, 2016. FINDINGS: Lower chest: No acute abnormality. Hepatobiliary: Moderate gallbladder distention is noted. No cholelithiasis or biliary dilatation is noted.  Liver is unremarkable. Pancreas: Unremarkable. No pancreatic ductal dilatation or surrounding inflammatory changes. Spleen: Normal in size without focal abnormality. Adrenals/Urinary Tract: Adrenal glands are unremarkable. Kidneys are normal, without renal calculi, focal lesion, or hydronephrosis. Bladder is unremarkable. Stomach/Bowel: Small sliding-type hiatal hernia. Status post gastric bypass. The appendix appears normal. There is no evidence of bowel obstruction or inflammation. Vascular/Lymphatic: Aortic atherosclerosis. No enlarged abdominal or pelvic lymph nodes. Reproductive: Status post hysterectomy. No adnexal masses. Other: No ascites or hernia is noted. Musculoskeletal: No acute or significant osseous findings. IMPRESSION: Moderate gallbladder distension. No cholelithiasis or inflammatory changes are noted. Small sliding-type hiatal hernia. Status post gastric bypass. Aortic Atherosclerosis (ICD10-I70.0). Electronically Signed   By: Lynwood Landy Raddle M.D.   On: 12/28/2023 11:38     Procedures   Medications Ordered in the ED  promethazine  (PHENERGAN ) 12.5 mg in sodium chloride  0.9 % 50 mL IVPB (has no administration in time range)  promethazine  (PHENERGAN ) 25 MG/ML injection (has no administration in time range)  sodium chloride  0.9 % bolus 1,000 mL (0 mLs Intravenous Stopped 12/28/23 1138)  ondansetron  (ZOFRAN ) injection 4 mg (4 mg Intravenous Given 12/28/23 1014)  morphine  (PF) 4 MG/ML injection 4 mg (4 mg Intravenous Given 12/28/23 1016)  HYDROmorphone  (DILAUDID ) injection 0.5 mg (0.5 mg Intravenous Given 12/28/23 1115)  piperacillin -tazobactam (ZOSYN )  IVPB 3.375 g (0 g Intravenous Stopped 12/28/23 1146)  iohexol  (OMNIPAQUE ) 300 MG/ML solution 100 mL (100 mLs Intravenous Contrast Given 12/28/23 1100)  HYDROmorphone  (DILAUDID ) injection 0.5 mg (0.5 mg Intravenous Given 12/28/23 1321)  gadobutrol  (GADAVIST ) 1 MMOL/ML injection 10 mL (10 mLs Intravenous Contrast Given 12/28/23 1505)  sodium chloride  0.9 % bolus 500 mL (500 mLs Intravenous New Bag/Given 12/28/23 1558)  HYDROmorphone  (DILAUDID ) injection 0.5 mg (0.5 mg Intravenous Given 12/28/23 1559)                                    Medical Decision Making Amount and/or Complexity of Data Reviewed Labs: ordered. Radiology: ordered.  Risk Prescription drug management.   This patient presents to the ED for concern of abd pain, this involves an extensive number of treatment options, and is a complaint that carries with it a high risk of complications and morbidity.  The differential diagnosis includes cholecystitis, pancreatitis, gastritis, sbo   Co morbidities that complicate the patient evaluation  fibromyalgia, ADD, depression, HTN, sleep apnea, IBS, CKD, HLD, CHF, hx SBO and gastric bypass, and anxiety   Additional history obtained:  Additional history obtained from epic chart review External records from outside source obtained and reviewed including husband   Lab Tests:  I Ordered, and personally interpreted labs.  The pertinent results include:  cbc with wbc elevated at 21.8; cmp with cr sl elevated at 1.06; LFTs elevated with ast 150, alt 228, ap 384, tb 3.0; lipase 261   Imaging Studies ordered:  I ordered imaging studies including ct abd/pelvis and mrcp I independently visualized and interpreted imaging which showed  CT abd/pelvis: Moderate gallbladder distension. No cholelithiasis or inflammatory  changes are noted.    Small sliding-type hiatal hernia.    Status post gastric bypass.    Aortic Atherosclerosis (ICD10-I70.0).   I agree with the radiologist  interpretation   Cardiac Monitoring:  The patient was maintained on a cardiac monitor.  I personally viewed and interpreted the cardiac monitored which showed an underlying rhythm of: nsr   Medicines ordered and prescription drug management:  I ordered medication including morphine /zofran lamona  for sx  Reevaluation of the patient after these medicines showed that the patient improved I have reviewed the patients home medicines and have made adjustments as needed   Test Considered:  mrcp   Critical Interventions:  mrcp   Problem List / ED Course:  Suspected choledocholithiasis:  MRCP is pending at shift change   Reevaluation:  After the interventions noted above, I reevaluated the patient and found that they have :improved   Social Determinants of Health:  Lives at home   Dispostion:  After consideration of the diagnostic results and the patients response to treatment, I feel that the patent would benefit from admission.       Final diagnoses:  Epigastric pain    ED Discharge Orders     None          Dean Clarity, MD 12/28/23 [phone]

## 2023-12-28 NOTE — ED Notes (Signed)
 Patient's oxygen saturation dropped after morphine  administration. Patient placed on 3L oxygen at this time.

## 2023-12-28 NOTE — ED Provider Notes (Signed)
 Patient initially seen by Dr. Dean.  Please see her note.  Patient presented to the ED for evaluation of abdominal pain nausea and vomiting.  Patient noted to have significant leukocytosis elevated lipase and LFTs.  Her CT scan showed moderate gallbladder distention but no other acute abnormality.  MRCP has been ordered and is pending.  Discussed findings with the patient and her husband.  She still having pain additional pain meds have been ordered.  Discussed the case with Dr. Cindy hospitalist.  I have also spoken with Dr. Teresa to general surgery.  Patient will still need GI consultation when she arrives at the hospital.   Randol Simmonds, MD 12/28/23 1756

## 2023-12-28 NOTE — Plan of Care (Signed)
  Problem: Education: Goal: Knowledge of General Education information will improve Description: Including pain rating scale, medication(s)/side effects and non-pharmacologic comfort measures Outcome: Progressing   Problem: Clinical Measurements: Goal: Ability to maintain clinical measurements within normal limits will improve Outcome: Progressing Goal: Diagnostic test results will improve Outcome: Progressing   Problem: Activity: Goal: Risk for activity intolerance will decrease Outcome: Progressing   Problem: Nutrition: Goal: Adequate nutrition will be maintained Outcome: Progressing   Problem: Pain Managment: Goal: General experience of comfort will improve and/or be controlled Outcome: Progressing

## 2023-12-28 NOTE — ED Triage Notes (Signed)
 Pt c/o abdominal pain in all 4 quads, N/V that started this am. Hx of gastric bypass, bowel obstruction. LBM 7/11

## 2023-12-29 ENCOUNTER — Encounter (HOSPITAL_COMMUNITY): Payer: Self-pay | Admitting: Internal Medicine

## 2023-12-29 ENCOUNTER — Inpatient Hospital Stay (HOSPITAL_COMMUNITY)

## 2023-12-29 DIAGNOSIS — Z9884 Bariatric surgery status: Secondary | ICD-10-CM | POA: Diagnosis not present

## 2023-12-29 DIAGNOSIS — R7989 Other specified abnormal findings of blood chemistry: Secondary | ICD-10-CM | POA: Diagnosis not present

## 2023-12-29 DIAGNOSIS — K8050 Calculus of bile duct without cholangitis or cholecystitis without obstruction: Secondary | ICD-10-CM | POA: Diagnosis not present

## 2023-12-29 DIAGNOSIS — K851 Biliary acute pancreatitis without necrosis or infection: Secondary | ICD-10-CM | POA: Diagnosis not present

## 2023-12-29 LAB — BASIC METABOLIC PANEL WITH GFR
Anion gap: 8 (ref 5–15)
BUN: 27 mg/dL — ABNORMAL HIGH (ref 8–23)
CO2: 24 mmol/L (ref 22–32)
Calcium: 7.4 mg/dL — ABNORMAL LOW (ref 8.9–10.3)
Chloride: 105 mmol/L (ref 98–111)
Creatinine, Ser: 1.3 mg/dL — ABNORMAL HIGH (ref 0.44–1.00)
GFR, Estimated: 46 mL/min — ABNORMAL LOW (ref >60)
Glucose, Bld: 93 mg/dL (ref 70–99)
Potassium: 4.3 mmol/L (ref 3.5–5.1)
Sodium: 137 mmol/L (ref 135–145)

## 2023-12-29 LAB — BLOOD CULTURE ID PANEL (REFLEXED) - BCID2

## 2023-12-29 LAB — COMPREHENSIVE METABOLIC PANEL WITH GFR
ALT: 163 U/L — ABNORMAL HIGH (ref 0–44)
AST: 145 U/L — ABNORMAL HIGH (ref 15–41)
Albumin: 3 g/dL — ABNORMAL LOW (ref 3.5–5.0)
Alkaline Phosphatase: 257 U/L — ABNORMAL HIGH (ref 38–126)
Anion gap: 8 (ref 5–15)
BUN: 27 mg/dL — ABNORMAL HIGH (ref 8–23)
CO2: 25 mmol/L (ref 22–32)
Calcium: 7.2 mg/dL — ABNORMAL LOW (ref 8.9–10.3)
Chloride: 103 mmol/L (ref 98–111)
Creatinine, Ser: 1.31 mg/dL — ABNORMAL HIGH (ref 0.44–1.00)
GFR, Estimated: 46 mL/min — ABNORMAL LOW (ref >60)
Glucose, Bld: 98 mg/dL (ref 70–99)
Potassium: 4.2 mmol/L (ref 3.5–5.1)
Sodium: 136 mmol/L (ref 135–145)
Total Bilirubin: 4.1 mg/dL — ABNORMAL HIGH (ref 0.0–1.2)
Total Protein: 6.6 g/dL (ref 6.5–8.1)

## 2023-12-29 LAB — CBC
HCT: 33.5 % — ABNORMAL LOW (ref 36.0–46.0)
Hemoglobin: 10.3 g/dL — ABNORMAL LOW (ref 12.0–15.0)
MCH: 25.2 pg — ABNORMAL LOW (ref 26.0–34.0)
MCHC: 30.7 g/dL (ref 30.0–36.0)
MCV: 81.9 fL (ref 80.0–100.0)
Platelets: 261 K/uL (ref 150–400)
RBC: 4.09 MIL/uL (ref 3.87–5.11)
RDW: 20.1 % — ABNORMAL HIGH (ref 11.5–15.5)
WBC: 14.1 K/uL — ABNORMAL HIGH (ref 4.0–10.5)
nRBC: 0 % (ref 0.0–0.2)

## 2023-12-29 LAB — HIV ANTIBODY (ROUTINE TESTING W REFLEX): HIV Screen 4th Generation wRfx: NONREACTIVE

## 2023-12-29 LAB — PROTIME-INR
INR: 1 (ref 0.8–1.2)
Prothrombin Time: 14.1 s (ref 11.4–15.2)

## 2023-12-29 LAB — SEDIMENTATION RATE: Sed Rate: 55 mm/h — ABNORMAL HIGH (ref 0–22)

## 2023-12-29 LAB — HEMOGLOBIN AND HEMATOCRIT, BLOOD
HCT: 32.4 % — ABNORMAL LOW (ref 36.0–46.0)
Hemoglobin: 10 g/dL — ABNORMAL LOW (ref 12.0–15.0)

## 2023-12-29 LAB — C-REACTIVE PROTEIN: CRP: 12.9 mg/dL — ABNORMAL HIGH (ref <1.0)

## 2023-12-29 MED ORDER — LACTATED RINGERS IV SOLN: INTRAVENOUS | Status: AC

## 2023-12-29 NOTE — Plan of Care (Signed)
   Problem: Nutrition: Goal: Adequate nutrition will be maintained Outcome: Progressing   Problem: Pain Managment: Goal: General experience of comfort will improve and/or be controlled Outcome: Progressing   Problem: Safety: Goal: Ability to remain free from injury will improve Outcome: Progressing

## 2023-12-29 NOTE — Progress Notes (Addendum)
 PROGRESS NOTE    Kimberly Castro  FMW:[id number] DOB: Aug 10, 1960 DOA: 12/28/2023 PCP: Corlis Pagan, NP   Brief Narrative:  63 y.o. female with medical history significant for fibromyalgia, ADD, depression, HTN, sleep apnea, IBS, CKD, HLD, chronic diastolic HF, morbid obesity, hx SBO and gastric bypass, and anxiety presented with abdominal pain, nausea and vomiting.  On presentation, WBC was 21.8, AST of 150, ALT of 228, bilirubin of 3, lipase of 261.  CT of abdomen and pelvis showed moderate gallbladder distention but no cholelithiasis.  MRCP showed acute cholecystitis and possible choledocholithiasis.  She was started on IV antibiotics.  General surgery was consulted who recommended GI consultation as well.  Assessment & Plan:   Acute cholecystitis Gallstone pancreatitis Choledocholithiasis Elevated LFTs - Imaging as above.  Continue broad-spectrum antibiotics and IV fluids.  Await GI and general surgery recommendations.  Continue pain management and antiemetics as needed - Repeat a.m. LFTs  Chronic diastolic CHF  hypertension - Currently compensated.  Blood pressure currently stable.  Continue metoprolol .  Hold irbesartan  and spironolactone .  Strict input output.  Daily weights  Leukocytosis - Improving  Fibromyalgia - Continue Lyrica   Anxiety and depression Continue bupropion  and Effexor   Allergic rhinitis - Continue loratadine  and Flonase   History of migraines - Continue atogepant   OSA - Continue CPAP at bedtime  Morbid obesity class III - Outpatient follow-up  DVT prophylaxis: Lovenox  Code Status: Full Family Communication: None at bedside Disposition Plan: Status is: Inpatient Remains inpatient appropriate because: Of severity of illness    Consultants: General Surgery/GI  Procedures: None  Antimicrobials: Zosyn  from 12/28/2023 onwards   Subjective: Patient seen and examined at bedside.  Still having intermittent abdominal pain but feels slightly  better.  Denies any current nausea or vomiting.  No chest pain or shortness of breath reported. Objective: Vitals:   12/28/23 2037 12/29/23 0037 12/29/23 0424 12/29/23 0922  BP: (!) 144/63 (!) 113/57 (!) 100/54 (!) 101/50  Pulse: 92 80 71 72  Resp: 20 20 20    Temp: 100.1 F (37.8 C) 100.2 F (37.9 C) 100 F (37.8 C)   TempSrc: Oral Oral Oral   SpO2: 91% 91% 91%   Weight: (!) 145.2 kg     Height: 5' 4 (1.626 m)       Intake/Output Summary (Last 24 hours) at 12/29/2023 1129 Last data filed at 12/29/2023 0300 Gross per 24 hour  Intake 1248.22 ml  Output --  Net 1248.22 ml   Filed Weights   12/28/23 0941 12/28/23 2037  Weight: (!) 145.2 kg (!) 145.2 kg    Examination:  General exam: Appears calm and comfortable. Respiratory system: Bilateral decreased breath sounds at bases Cardiovascular system: S1 & S2 heard, Rate controlled Gastrointestinal system: Abdomen is morbidly obese, nondistended, soft and mildly tender in the right lower quadrant.  Normal bowel sounds heard. Extremities: No cyanosis, clubbing, edema  Central nervous system: Alert and oriented. No focal neurological deficits. Moving extremities Skin: No rashes, lesions or ulcers Psychiatry: Judgement and insight appear normal. Mood & affect appropriate.     Data Reviewed: I have personally reviewed following labs and imaging studies  CBC: Recent Labs  Lab 12/28/23 0947 12/29/23 0610  WBC 21.8* 14.1*  NEUTROABS 19.8*  --   HGB 11.8* 10.3*  HCT 37.2 33.5*  MCV 78.8* 81.9  PLT 344 261   Basic Metabolic Panel: Recent Labs  Lab 12/28/23 0947 12/29/23 0610  NA 137 136  K 4.0 4.2  CL 99 103  CO2 24  25  GLUCOSE 146* 98  BUN 20 27*  CREATININE 1.06* 1.31*  CALCIUM  8.6* 7.2*   GFR: Estimated Creatinine Clearance: 63.9 mL/min (A) (by C-G formula based on SCr of 1.31 mg/dL (H)). Liver Function Tests: Recent Labs  Lab 12/28/23 0947 12/29/23 0610  AST 150* 145*  ALT 228* 163*  ALKPHOS 384* 257*   BILITOT 3.0* 4.1*  PROT 7.7 6.6  ALBUMIN  4.0 3.0*   Recent Labs  Lab 12/28/23 0947  LIPASE 261*   No results for input(s): AMMONIA in the last 168 hours. Coagulation Profile: No results for input(s): INR, PROTIME in the last 168 hours. Cardiac Enzymes: No results for input(s): CKTOTAL, CKMB, CKMBINDEX, TROPONINI in the last 168 hours. BNP (last 3 results) No results for input(s): PROBNP in the last 8760 hours. HbA1C: No results for input(s): HGBA1C in the last 72 hours. CBG: No results for input(s): GLUCAP in the last 168 hours. Lipid Profile: No results for input(s): CHOL, HDL, LDLCALC, TRIG, CHOLHDL, LDLDIRECT in the last 72 hours. Thyroid Function Tests: No results for input(s): TSH, T4TOTAL, FREET4, T3FREE, THYROIDAB in the last 72 hours. Anemia Panel: No results for input(s): VITAMINB12, FOLATE, FERRITIN, TIBC, IRON, RETICCTPCT in the last 72 hours. Sepsis Labs: No results for input(s): PROCALCITON, LATICACIDVEN in the last 168 hours.  Recent Results (from the past 240 hours)  Culture, blood (single)     Status: None (Preliminary result)   Collection Time: 12/28/23  9:46 AM   Specimen: BLOOD  Result Value Ref Range Status   Specimen Description   Final    BLOOD LEFT ANTECUBITAL Performed at [facility], [address] [place], [place] [address]    Special Requests   Final    BOTTLES DRAWN AEROBIC AND ANAEROBIC Blood Culture adequate volume Performed at [facility], [address], [place], [place] [address]    Culture  Setup Time   Final    GRAM POSITIVE COCCI ANAEROBIC BOTTLE ONLY CRITICAL RESULT CALLED TO, READ BACK BY AND VERIFIED WITH: PHARMD ABBY BETHENE [id number] AT 0836 BY EC Performed at [facility], [address] [place], [place] [address]    Culture GRAM POSITIVE COCCI  Final   Report Status PENDING  Incomplete  Blood Culture ID Panel  (Reflexed)     Status: Abnormal   Collection Time: 12/28/23  9:46 AM  Result Value Ref Range Status   Enterococcus faecalis NOT DETECTED NOT DETECTED Final   Enterococcus Faecium NOT DETECTED NOT DETECTED Final   Listeria monocytogenes NOT DETECTED NOT DETECTED Final   Staphylococcus species NOT DETECTED NOT DETECTED Final   Staphylococcus aureus (BCID) NOT DETECTED NOT DETECTED Final   Staphylococcus epidermidis NOT DETECTED NOT DETECTED Final   Staphylococcus lugdunensis NOT DETECTED NOT DETECTED Final   Streptococcus species DETECTED (A) NOT DETECTED Final    Comment: Not Enterococcus species, Streptococcus agalactiae, Streptococcus pyogenes, or Streptococcus pneumoniae. CRITICAL RESULT CALLED TO, READ BACK BY AND VERIFIED WITH: PHARMD ABBY BETHENE [phone] AT 0836 BY EC    Streptococcus agalactiae NOT DETECTED NOT DETECTED Final   Streptococcus pneumoniae NOT DETECTED NOT DETECTED Final   Streptococcus pyogenes NOT DETECTED NOT DETECTED Final   A.calcoaceticus-baumannii NOT DETECTED NOT DETECTED Final   Bacteroides fragilis NOT DETECTED NOT DETECTED Final   Enterobacterales NOT DETECTED NOT DETECTED Final   Enterobacter cloacae complex NOT DETECTED NOT DETECTED Final   Escherichia coli NOT DETECTED NOT DETECTED Final   Klebsiella aerogenes NOT DETECTED NOT DETECTED Final   Klebsiella  oxytoca NOT DETECTED NOT DETECTED Final   Klebsiella pneumoniae NOT DETECTED NOT DETECTED Final   Proteus species NOT DETECTED NOT DETECTED Final   Salmonella species NOT DETECTED NOT DETECTED Final   Serratia marcescens NOT DETECTED NOT DETECTED Final   Haemophilus influenzae NOT DETECTED NOT DETECTED Final   Neisseria meningitidis NOT DETECTED NOT DETECTED Final   Pseudomonas aeruginosa NOT DETECTED NOT DETECTED Final   Stenotrophomonas maltophilia NOT DETECTED NOT DETECTED Final   Candida albicans NOT DETECTED NOT DETECTED Final   Candida auris NOT DETECTED NOT DETECTED Final   Candida  glabrata NOT DETECTED NOT DETECTED Final   Candida krusei NOT DETECTED NOT DETECTED Final   Candida parapsilosis NOT DETECTED NOT DETECTED Final   Candida tropicalis NOT DETECTED NOT DETECTED Final   Cryptococcus neoformans/gattii NOT DETECTED NOT DETECTED Final    Comment: Performed at [facility], [address], [place], [place] [address]         Radiology Studies: MR ABDOMEN WITH MRCP W CONTRAST Result Date: 12/28/2023 CLINICAL DATA:  Right upper abdominal pain with gallbladder distension on same day CT EXAM: MRI ABDOMEN WITH CONTRAST (WITH MRCP) TECHNIQUE: Multiplanar multisequence MR imaging of the abdomen was performed following the administration of intravenous contrast. Heavily T2-weighted images of the biliary and pancreatic ducts were obtained, and three-dimensional MRCP images were rendered by post processing. CONTRAST:  10mL GADAVIST  GADOBUTROL  1 MMOL/ML IV SOLN COMPARISON:  Same day CT abdomen and pelvis FINDINGS: Postcontrast sequences are motion degraded. Lower chest: No acute findings.  Multichamber cardiomegaly. Hepatobiliary: No mass or other parenchymal abnormality identified. Markedly distended gallbladder contains innumerable stones. Mild gallbladder mural thickening and trace pericholecystic free fluid. The cystic duct appears dilated, measuring up to 1.7 cm. No dilation of the common bile duct, which appears to contain a 4 mm filling defect in the downstream duct (19:14). Pancreas: No mass, inflammatory changes, or other parenchymal abnormality identified. Spleen:  Enlarged spleen measures 15.2 cm. Adrenals/Urinary Tract: No adrenal nodules. No suspicious renal masses identified. No evidence of hydronephrosis. Bilateral subcentimeter T2 hyperintense simple cysts. No specific follow-up imaging recommended. Stomach/Bowel: Postsurgical changes of Roux-en-Y gastric bypass. Small hiatal hernia. Vascular/Lymphatic: No pathologically enlarged lymph nodes identified. No abdominal  aortic aneurysm demonstrated. Other:  None. Musculoskeletal: No suspicious bone lesions identified. IMPRESSION: 1. Markedly distended gallbladder contains innumerable stones with mild gallbladder mural thickening and trace pericholecystic free fluid, suspicious for acute cholecystitis. 2. Nondilated common bile duct, which appears to contain a 4 mm filling defect in the downstream duct, suspicious for choledocholithiasis. 3. Splenomegaly. 4. Multichamber cardiomegaly. 5. Small hiatal hernia. Electronically Signed   By: Limin  Xu M.D.   On: 12/28/2023 16:57   CT ABDOMEN PELVIS W CONTRAST Result Date: 12/28/2023 CLINICAL DATA:  Acute generalized abdominal pain. EXAM: CT ABDOMEN AND PELVIS WITH CONTRAST TECHNIQUE: Multidetector CT imaging of the abdomen and pelvis was performed using the standard protocol following bolus administration of intravenous contrast. RADIATION DOSE REDUCTION: This exam was performed according to the departmental dose-optimization program which includes automated exposure control, adjustment of the mA and/or kV according to patient size and/or use of iterative reconstruction technique. CONTRAST:  OMNIPAQUE  IOHEXOL  300 MG/ML  SOLN COMPARISON:  January 30, 2020.  August 12, 2016. FINDINGS: Lower chest: No acute abnormality. Hepatobiliary: Moderate gallbladder distention is noted. No cholelithiasis or biliary dilatation is noted. Liver is unremarkable. Pancreas: Unremarkable. No pancreatic ductal dilatation or surrounding inflammatory changes. Spleen: Normal in size without focal abnormality. Adrenals/Urinary Tract: Adrenal glands  are unremarkable. Kidneys are normal, without renal calculi, focal lesion, or hydronephrosis. Bladder is unremarkable. Stomach/Bowel: Small sliding-type hiatal hernia. Status post gastric bypass. The appendix appears normal. There is no evidence of bowel obstruction or inflammation. Vascular/Lymphatic: Aortic atherosclerosis. No enlarged abdominal or pelvic  lymph nodes. Reproductive: Status post hysterectomy. No adnexal masses. Other: No ascites or hernia is noted. Musculoskeletal: No acute or significant osseous findings. IMPRESSION: Moderate gallbladder distension. No cholelithiasis or inflammatory changes are noted. Small sliding-type hiatal hernia. Status post gastric bypass. Aortic Atherosclerosis (ICD10-I70.0). Electronically Signed   By: Lynwood Landy Raddle M.D.   On: 12/28/2023 11:38        Scheduled Meds:  Atogepant   1 tablet Oral Daily   buPROPion   300 mg Oral Daily   cholecalciferol   5,000 Units Oral Daily   enoxaparin  (LOVENOX ) injection  40 mg Subcutaneous Q24H   fluticasone   1 spray Each Nare Daily   irbesartan   150 mg Oral Daily   loratadine   10 mg Oral Daily   metoprolol  tartrate  25 mg Oral BID   pregabalin   150 mg Oral BID   spironolactone   12.5 mg Oral Daily   venlafaxine  XR  225 mg Oral Q breakfast   Continuous Infusions:  lactated ringers      piperacillin -tazobactam (ZOSYN )  IV 3.375 g (12/29/23 0700)          Sophie Mao, MD [facility] 12/29/2023, 11:29 AM

## 2023-12-29 NOTE — Progress Notes (Signed)
   12/29/23 2246  BiPAP/CPAP/SIPAP  BiPAP/CPAP/SIPAP Pt Type Adult  BiPAP/CPAP/SIPAP Resmed  Mask Type Full face mask  Dentures removed? Not applicable  FiO2 (%) 21 %  Patient Home Machine No  Patient Home Mask No  Patient Home Tubing No  Auto Titrate Yes  Minimum cmH2O 9 cmH2O  Maximum cmH2O 20 cmH2O  CPAP/SIPAP surface wiped down Yes  Device Plugged into RED Power Outlet Yes

## 2023-12-29 NOTE — Progress Notes (Signed)
   12/29/23 0100  BiPAP/CPAP/SIPAP  $ Non-Invasive Home Ventilator  Initial  BiPAP/CPAP/SIPAP Pt Type Adult  BiPAP/CPAP/SIPAP Resmed  Mask Type Full face mask  Dentures removed? Not applicable  Respiratory Rate 18 breaths/min  FiO2 (%) 21 %  Patient Home Machine No  Patient Home Mask No  Patient Home Tubing No  Auto Titrate Yes  Minimum cmH2O 9 cmH2O  Maximum cmH2O 20 cmH2O  CPAP/SIPAP surface wiped down Yes  Device Plugged into RED Power Outlet Yes
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
## 2023-12-29 NOTE — Consult Note (Signed)
 Reason for Consult:ab pain Referring Physician: Dr Randol Katheryn Kimberly Castro is an 63 y.o. female.  HPI: 60 yof with history lgb at [place] around 1999. She has lap repair of an internal hernia here years ago also.  She also has OSA, chronic diastolic HF, HTN among others.  She had ab pain for couple days that started abruptly. Mostly epigastric.  Had nausea and emesis clear liquid.  Has had bowel function. Seen at outside ER and found to have wbc of 21, TB of 3 and lipase of 384. She had imaging that showed on ct moderate gb distention, no cholelithiasis.  She had mrcp that showed markedly distended gb with innumerable stones, trace free fluid. The cystic duct is 1.7 cm in size. The cbd is normal size but appears to have a 4 mm filling defect.  The pancreas appears normal.   We were asked to see her. She still has some ruq pain when I see her today.   Past Medical History:  Diagnosis Date   ADD (attention deficit disorder)    Anemia    Anxiety    Aortic atherosclerosis (HCC) 03/09/2023   B12 deficiency    Back pain    CHF (congestive heart failure) (HCC)    Chronic fatigue    CKD (chronic kidney disease), stage III (HCC)    Complication of anesthesia    during colonoscopy BP dropped very low   Constipation    Depression    Depression    Fatigue 06/10/2020   Fatty liver    Fibromyalgia    Headache    Heart murmur    no problems with it   Hypercholesteremia    Hypertension    Hypoglycemia    IBS (irritable bowel syndrome)    IBS (irritable bowel syndrome)    Osteopenia    Palpitations 03/31/2016   anxiety related   Shortness of breath 03/09/2023   Sleep apnea    uses cpap   SOB (shortness of breath)    Statin intolerance 03/09/2023    Past Surgical History:  Procedure Laterality Date   ANTERIOR AND POSTERIOR REPAIR N/A 06/23/2019   Procedure: ANTERIOR (CYSTOCELE) AND POSTERIOR REPAIR (RECTOCELE);  Surgeon: Henry Slough, MD;  Location: [facility] OR;  Service: Gynecology;   Laterality: N/A;  3 hours   BLADDER SUSPENSION N/A 06/23/2019   Procedure: TRANSVAGINAL TAPE (TVT) PROCEDURE;  Surgeon: Henry Slough, MD;  Location: [facility] OR;  Service: Gynecology;  Laterality: N/A;   COLONOSCOPY     GASTRIC BYPASS     LAPAROSCOPY N/A 08/12/2016   Procedure: LAPAROSCOPY DIAGNOSTIC, POSSIBLE LAPAROTOMY, POSSIBLE BOWEL RESECTION;  Surgeon: Morene Olives, MD;  Location: [facility] OR;  Service: General;  Laterality: N/A;   PARTIAL HYSTERECTOMY     still has ovaries   TONSILLECTOMY     WISDOM TOOTH EXTRACTION      Family History  Problem Relation Age of Onset   Kidney disease Mother    Heart failure Mother    COPD Mother    Atrial fibrillation Mother    Kidney failure Mother    High blood pressure Mother    High Cholesterol Mother    Sleep apnea Mother    AAA (abdominal aortic aneurysm) Father    COPD Father    Heart disease Father    Heart failure Father    High Cholesterol Father    Sleep apnea Father    Peripheral Artery Disease Brother    COPD Brother    COPD Brother  Stroke Brother    Diabetes Brother    CAD Brother    Diabetes Maternal Grandmother    Heart disease Maternal Grandmother    Heart disease Maternal Grandfather    Diabetes Maternal Grandfather    Diabetes Paternal Grandmother    Stroke Paternal Grandmother    Heart disease Paternal Grandfather    Testicular cancer Son 41    Social History:  reports that she has quit smoking. She has never used smokeless tobacco. She reports current alcohol use. She reports that she does not use drugs.  Allergies:  Allergies  Allergen Reactions   Lipitor [Atorvastatin ] Other (See Comments)    myalgias    Medications: I have reviewed the patient's current medications.  Results for orders placed or performed during the hospital encounter of 12/28/23 (from the past 48 hours)  Culture, blood (single)     Status: None (Preliminary result)   Collection Time: 12/28/23  9:46 AM   Specimen: BLOOD  Result  Value Ref Range   Specimen Description      BLOOD LEFT ANTECUBITAL Performed at [facility], [address], [place], [place] [address]    Special Requests      BOTTLES DRAWN AEROBIC AND ANAEROBIC Blood Culture adequate volume Performed at [facility], [address] [place], [place] [address]    Culture  Setup Time      GRAM POSITIVE COCCI ANAEROBIC BOTTLE ONLY CRITICAL RESULT CALLED TO, READ BACK BY AND VERIFIED WITH: PHARMD ABBY BETHENE [id number] AT 0836 BY EC Performed at [facility], [address], [place], [place] [address]    Culture GRAM POSITIVE COCCI    Report Status PENDING   Blood Culture ID Panel (Reflexed)     Status: Abnormal   Collection Time: 12/28/23  9:46 AM  Result Value Ref Range   Enterococcus faecalis NOT DETECTED NOT DETECTED   Enterococcus Faecium NOT DETECTED NOT DETECTED   Listeria monocytogenes NOT DETECTED NOT DETECTED   Staphylococcus species NOT DETECTED NOT DETECTED   Staphylococcus aureus (BCID) NOT DETECTED NOT DETECTED   Staphylococcus epidermidis NOT DETECTED NOT DETECTED   Staphylococcus lugdunensis NOT DETECTED NOT DETECTED   Streptococcus species DETECTED (A) NOT DETECTED    Comment: Not Enterococcus species, Streptococcus agalactiae, Streptococcus pyogenes, or Streptococcus pneumoniae. CRITICAL RESULT CALLED TO, READ BACK BY AND VERIFIED WITH: PHARMD ABBY BETHENE [id number] AT 0836 BY EC    Streptococcus agalactiae NOT DETECTED NOT DETECTED   Streptococcus pneumoniae NOT DETECTED NOT DETECTED   Streptococcus pyogenes NOT DETECTED NOT DETECTED   A.calcoaceticus-baumannii NOT DETECTED NOT DETECTED   Bacteroides fragilis NOT DETECTED NOT DETECTED   Enterobacterales NOT DETECTED NOT DETECTED   Enterobacter cloacae complex NOT DETECTED NOT DETECTED   Escherichia coli NOT DETECTED NOT DETECTED   Klebsiella aerogenes NOT DETECTED NOT DETECTED   Klebsiella oxytoca NOT DETECTED NOT DETECTED    Klebsiella pneumoniae NOT DETECTED NOT DETECTED   Proteus species NOT DETECTED NOT DETECTED   Salmonella species NOT DETECTED NOT DETECTED   Serratia marcescens NOT DETECTED NOT DETECTED   Haemophilus influenzae NOT DETECTED NOT DETECTED   Neisseria meningitidis NOT DETECTED NOT DETECTED   Pseudomonas aeruginosa NOT DETECTED NOT DETECTED   Stenotrophomonas maltophilia NOT DETECTED NOT DETECTED   Candida albicans NOT DETECTED NOT DETECTED   Candida auris NOT DETECTED NOT DETECTED   Candida glabrata NOT DETECTED NOT DETECTED   Candida krusei NOT DETECTED NOT DETECTED   Candida parapsilosis NOT DETECTED NOT DETECTED   Candida  tropicalis NOT DETECTED NOT DETECTED   Cryptococcus neoformans/gattii NOT DETECTED NOT DETECTED    Comment: Performed at [facility], [address], [place], [place] [address]  CBC with Differential     Status: Abnormal   Collection Time: 12/28/23  9:47 AM  Result Value Ref Range   WBC 21.8 (H) 4.0 - 10.5 K/uL   RBC 4.72 3.87 - 5.11 MIL/uL   Hemoglobin 11.8 (L) 12.0 - 15.0 g/dL   HCT 62.7 63.9 - 53.9 %   MCV 78.8 (L) 80.0 - 100.0 fL   MCH 25.0 (L) 26.0 - 34.0 pg   MCHC 31.7 30.0 - 36.0 g/dL   RDW 80.0 (H) 88.4 - 84.4 %   Platelets 344 150 - 400 K/uL   nRBC 0.0 0.0 - 0.2 %   Neutrophils Relative % 90 %   Neutro Abs 19.8 (H) 1.7 - 7.7 K/uL   Lymphocytes Relative 3 %   Lymphs Abs 0.6 (L) 0.7 - 4.0 K/uL   Monocytes Relative 6 %   Monocytes Absolute 1.2 (H) 0.1 - 1.0 K/uL   Eosinophils Relative 0 %   Eosinophils Absolute 0.0 0.0 - 0.5 K/uL   Basophils Relative 0 %   Basophils Absolute 0.0 0.0 - 0.1 K/uL   Immature Granulocytes 1 %   Abs Immature Granulocytes 0.17 (H) 0.00 - 0.07 K/uL    Comment: Performed at [facility], [address] [place], [place] [address]  Comprehensive metabolic panel     Status: Abnormal   Collection Time: 12/28/23  9:47 AM  Result Value Ref Range   Sodium 137 135 - 145 mmol/L   Potassium 4.0 3.5 - 5.1  mmol/L   Chloride 99 98 - 111 mmol/L   CO2 24 22 - 32 mmol/L   Glucose, Bld 146 (H) 70 - 99 mg/dL    Comment: Glucose reference range applies only to samples taken after fasting for at least 8 hours.   BUN 20 8 - 23 mg/dL   Creatinine, Ser 8.93 (H) 0.44 - 1.00 mg/dL   Calcium  8.6 (L) 8.9 - 10.3 mg/dL   Total Protein 7.7 6.5 - 8.1 g/dL   Albumin  4.0 3.5 - 5.0 g/dL   AST 849 (H) 15 - 41 U/L   ALT 228 (H) 0 - 44 U/L   Alkaline Phosphatase 384 (H) 38 - 126 U/L   Total Bilirubin 3.0 (H) 0.0 - 1.2 mg/dL   GFR, Estimated 59 (L) >60 mL/min    Comment: (NOTE) Calculated using the CKD-EPI Creatinine Equation (2021)    Anion gap 14 5 - 15    Comment: Performed at [facility], [address] [place], [place] [address]  Lipase, blood     Status: Abnormal   Collection Time: 12/28/23  9:47 AM  Result Value Ref Range   Lipase 261 (H) 11 - 51 U/L    Comment: Performed at [facility], [address], [place], [place] [address]  Urinalysis, Routine w reflex microscopic -Urine, Clean Catch     Status: Abnormal   Collection Time: 12/28/23  1:35 PM  Result Value Ref Range   Color, Urine YELLOW YELLOW   APPearance CLEAR CLEAR   Specific Gravity, Urine >1.046 (H) 1.005 - 1.030   pH 6.0 5.0 - 8.0   Glucose, UA NEGATIVE NEGATIVE mg/dL   Hgb urine dipstick TRACE (A) NEGATIVE   Bilirubin Urine SMALL (A) NEGATIVE   Ketones, ur NEGATIVE NEGATIVE mg/dL   Protein, ur 30 (A)  NEGATIVE mg/dL   Nitrite NEGATIVE NEGATIVE   Leukocytes,Ua TRACE (A) NEGATIVE   RBC / HPF 0-5 0 - 5 RBC/hpf   WBC, UA 11-20 0 - 5 WBC/hpf   Bacteria, UA NONE SEEN NONE SEEN   Squamous Epithelial / HPF 0-5 0 - 5 /HPF   Mucus PRESENT     Comment: Performed at [facility], [address] [place], [place] [address]  CBC     Status: Abnormal   Collection Time: 12/29/23  6:10 AM  Result Value Ref Range   WBC 14.1 (H) 4.0 - 10.5 K/uL   RBC 4.09 3.87 - 5.11 MIL/uL    Hemoglobin 10.3 (L) 12.0 - 15.0 g/dL   HCT 66.4 (L) 63.9 - 53.9 %   MCV 81.9 80.0 - 100.0 fL   MCH 25.2 (L) 26.0 - 34.0 pg   MCHC 30.7 30.0 - 36.0 g/dL   RDW 79.8 (H) 88.4 - 84.4 %   Platelets 261 150 - 400 K/uL   nRBC 0.0 0.0 - 0.2 %    Comment: Performed at [facility], [address] [place], [place] [address]  Comprehensive metabolic panel with GFR     Status: Abnormal   Collection Time: 12/29/23  6:10 AM  Result Value Ref Range   Sodium 136 135 - 145 mmol/L   Potassium 4.2 3.5 - 5.1 mmol/L   Chloride 103 98 - 111 mmol/L   CO2 25 22 - 32 mmol/L   Glucose, Bld 98 70 - 99 mg/dL    Comment: Glucose reference range applies only to samples taken after fasting for at least 8 hours.   BUN 27 (H) 8 - 23 mg/dL   Creatinine, Ser 8.68 (H) 0.44 - 1.00 mg/dL   Calcium  7.2 (L) 8.9 - 10.3 mg/dL   Total Protein 6.6 6.5 - 8.1 g/dL   Albumin  3.0 (L) 3.5 - 5.0 g/dL   AST 854 (H) 15 - 41 U/L   ALT 163 (H) 0 - 44 U/L   Alkaline Phosphatase 257 (H) 38 - 126 U/L   Total Bilirubin 4.1 (H) 0.0 - 1.2 mg/dL   GFR, Estimated 46 (L) >60 mL/min    Comment: (NOTE) Calculated using the CKD-EPI Creatinine Equation (2021)    Anion gap 8 5 - 15    Comment: Performed at [facility], [address], [place], [place] [address]    MR ABDOMEN WITH MRCP W CONTRAST Result Date: 12/28/2023 CLINICAL DATA:  Right upper abdominal pain with gallbladder distension on same day CT EXAM: MRI ABDOMEN WITH CONTRAST (WITH MRCP) TECHNIQUE: Multiplanar multisequence MR imaging of the abdomen was performed following the administration of intravenous contrast. Heavily T2-weighted images of the biliary and pancreatic ducts were obtained, and three-dimensional MRCP images were rendered by post processing. CONTRAST:  10mL GADAVIST  GADOBUTROL  1 MMOL/ML IV SOLN COMPARISON:  Same day CT abdomen and pelvis FINDINGS: Postcontrast sequences are motion degraded. Lower chest: No acute findings.   Multichamber cardiomegaly. Hepatobiliary: No mass or other parenchymal abnormality identified. Markedly distended gallbladder contains innumerable stones. Mild gallbladder mural thickening and trace pericholecystic free fluid. The cystic duct appears dilated, measuring up to 1.7 cm. No dilation of the common bile duct, which appears to contain a 4 mm filling defect in the downstream duct (19:14). Pancreas: No mass, inflammatory changes, or other parenchymal abnormality identified. Spleen:  Enlarged spleen measures 15.2 cm. Adrenals/Urinary Tract: No adrenal nodules. No suspicious renal masses identified. No evidence of hydronephrosis. Bilateral subcentimeter T2  hyperintense simple cysts. No specific follow-up imaging recommended. Stomach/Bowel: Postsurgical changes of Roux-en-Y gastric bypass. Small hiatal hernia. Vascular/Lymphatic: No pathologically enlarged lymph nodes identified. No abdominal aortic aneurysm demonstrated. Other:  None. Musculoskeletal: No suspicious bone lesions identified. IMPRESSION: 1. Markedly distended gallbladder contains innumerable stones with mild gallbladder mural thickening and trace pericholecystic free fluid, suspicious for acute cholecystitis. 2. Nondilated common bile duct, which appears to contain a 4 mm filling defect in the downstream duct, suspicious for choledocholithiasis. 3. Splenomegaly. 4. Multichamber cardiomegaly. 5. Small hiatal hernia. Electronically Signed   By: Limin  Xu M.D.   On: 12/28/2023 16:57   CT ABDOMEN PELVIS W CONTRAST Result Date: 12/28/2023 CLINICAL DATA:  Acute generalized abdominal pain. EXAM: CT ABDOMEN AND PELVIS WITH CONTRAST TECHNIQUE: Multidetector CT imaging of the abdomen and pelvis was performed using the standard protocol following bolus administration of intravenous contrast. RADIATION DOSE REDUCTION: This exam was performed according to the departmental dose-optimization program which includes automated exposure control, adjustment of the  mA and/or kV according to patient size and/or use of iterative reconstruction technique. CONTRAST:  OMNIPAQUE  IOHEXOL  300 MG/ML  SOLN COMPARISON:  January 30, 2020.  August 12, 2016. FINDINGS: Lower chest: No acute abnormality. Hepatobiliary: Moderate gallbladder distention is noted. No cholelithiasis or biliary dilatation is noted. Liver is unremarkable. Pancreas: Unremarkable. No pancreatic ductal dilatation or surrounding inflammatory changes. Spleen: Normal in size without focal abnormality. Adrenals/Urinary Tract: Adrenal glands are unremarkable. Kidneys are normal, without renal calculi, focal lesion, or hydronephrosis. Bladder is unremarkable. Stomach/Bowel: Small sliding-type hiatal hernia. Status post gastric bypass. The appendix appears normal. There is no evidence of bowel obstruction or inflammation. Vascular/Lymphatic: Aortic atherosclerosis. No enlarged abdominal or pelvic lymph nodes. Reproductive: Status post hysterectomy. No adnexal masses. Other: No ascites or hernia is noted. Musculoskeletal: No acute or significant osseous findings. IMPRESSION: Moderate gallbladder distension. No cholelithiasis or inflammatory changes are noted. Small sliding-type hiatal hernia. Status post gastric bypass. Aortic Atherosclerosis (ICD10-I70.0). Electronically Signed   By: Lynwood Landy Raddle M.D.   On: 12/28/2023 11:38    Review of Systems  Gastrointestinal:  Positive for abdominal pain, nausea and vomiting.  All other systems reviewed and are negative.  Blood pressure (!) 101/50, pulse 72, temperature 100 F (37.8 C), temperature source Oral, resp. rate 20, height 5' 4 (1.626 m), weight (!) 145.2 kg, SpO2 91%. Physical Exam Vitals reviewed.  Constitutional:      Appearance: She is obese.  Eyes:     General: No scleral icterus. Cardiovascular:     Rate and Rhythm: Normal rate and regular rhythm.  Pulmonary:     Effort: Pulmonary effort is normal.  Abdominal:     General: There is no  distension.     Tenderness: There is abdominal tenderness in the right upper quadrant.     Hernia: No hernia is present.  Skin:    Capillary Refill: Capillary refill takes less than 2 seconds.  Neurological:     General: No focal deficit present.     Mental Status: She is alert.  Psychiatric:        Mood and Affect: Mood normal.        Behavior: Behavior normal.     Assessment/Plan: Cholecystitis Mild GSP Choledocholithiasis  -abx -will wait for mild gsp to resolve although I think it will quickly -will recheck lfts in am -discussed with Dr Wilhelmenia of GI -likely needs ercp unless stone passes and lfts improve. This is complicated by her prior gastric bypass as will  need remnant access -may just end up needing lap chole with cholangiogram here and then have to figure out ercp later if needed -due to prior cardiac appts/issues would appreciate evaluation prior to surgery from cardiology if could arrange that -will see after labs done tomorrow.  I reviewed her vitals, labs and imaging of ct and the mrcp.  Discussed case with GI  Kimberly Castro 12/29/2023, 9:47 AM

## 2023-12-29 NOTE — Anesthesia Preprocedure Evaluation (Signed)
 Anesthesia Evaluation    Airway        Dental   Pulmonary sleep apnea and Continuous Positive Airway Pressure Ventilation , former smoker          Cardiovascular hypertension, Pt. on medications and Pt. on home beta blockers   03/2023 ECHO: EF 60-65%, normal LVF, normal RVF, mild MR, mild AS, with mean gradient 14.0 mmHg   Neuro/Psych  Headaches PSYCHIATRIC DISORDERS (ADHD) Anxiety Depression       GI/Hepatic Elevated LFTs: choledocholithiasis S/p gastric bypass   Endo/Other    Class 4 obesityBMI 55  Renal/GU Renal InsufficiencyRenal disease     Musculoskeletal  (+) Arthritis ,  Fibromyalgia -  Abdominal   Peds  Hematology  (+) Blood dyscrasia (Hb 10.0, plt 261k), anemia   Anesthesia Other Findings   Reproductive/Obstetrics                              Anesthesia Physical Anesthesia Plan  ASA: 3  Anesthesia Plan: General   Post-op Pain Management: Minimal or no pain anticipated   Induction: Intravenous  PONV Risk Score and Plan: 3 and Ondansetron , Dexamethasone  and Treatment may vary due to age or medical condition  Airway Management Planned: Oral ETT  Additional Equipment: None  Intra-op Plan:   Post-operative Plan: Extubation in OR  Informed Consent:   Plan Discussed with:   Anesthesia Plan Comments:         Anesthesia Quick Evaluation

## 2023-12-29 NOTE — Plan of Care (Signed)
  Problem: Education: Goal: Knowledge of General Education information will improve Description: Including pain rating scale, medication(s)/side effects and non-pharmacologic comfort measures Outcome: Progressing   Problem: Clinical Measurements: Goal: Will remain free from infection Outcome: Progressing Goal: Diagnostic test results will improve Outcome: Progressing   Problem: Nutrition: Goal: Adequate nutrition will be maintained Outcome: Progressing   Problem: Coping: Goal: Level of anxiety will decrease Outcome: Progressing   Problem: Safety: Goal: Ability to remain free from injury will improve Outcome: Progressing   Problem: Skin Integrity: Goal: Risk for impaired skin integrity will decrease Outcome: Progressing

## 2023-12-29 NOTE — Progress Notes (Signed)
 PHARMACY - PHYSICIAN COMMUNICATION CRITICAL VALUE ALERT - BLOOD CULTURE IDENTIFICATION (BCID)  Kimberly Castro is an 63 y.o. female who presented to [facility] on 12/28/2023 with a chief complaint of abdominal pain, N/V  Assessment:   Bcx 1/2 bottles (only one set collected) strep species Here with gallstone pancreatitis, cholecystitis   Name of physician (or Provider) Contacted: Dr. Cheryle  Current antibiotics: Zosyn  for intra-abdominal coverage  Changes to prescribed antibiotics recommended: none, bcx likely a contaminant  Results for orders placed or performed during the hospital encounter of 12/28/23  Blood Culture ID Panel (Reflexed) (Collected: 12/28/2023  9:46 AM)  Result Value Ref Range   Enterococcus faecalis NOT DETECTED NOT DETECTED   Enterococcus Faecium NOT DETECTED NOT DETECTED   Listeria monocytogenes NOT DETECTED NOT DETECTED   Staphylococcus species NOT DETECTED NOT DETECTED   Staphylococcus aureus (BCID) NOT DETECTED NOT DETECTED   Staphylococcus epidermidis NOT DETECTED NOT DETECTED   Staphylococcus lugdunensis NOT DETECTED NOT DETECTED   Streptococcus species DETECTED (A) NOT DETECTED   Streptococcus agalactiae NOT DETECTED NOT DETECTED   Streptococcus pneumoniae NOT DETECTED NOT DETECTED   Streptococcus pyogenes NOT DETECTED NOT DETECTED   A.calcoaceticus-baumannii NOT DETECTED NOT DETECTED   Bacteroides fragilis NOT DETECTED NOT DETECTED   Enterobacterales NOT DETECTED NOT DETECTED   Enterobacter cloacae complex NOT DETECTED NOT DETECTED   Escherichia coli NOT DETECTED NOT DETECTED   Klebsiella aerogenes NOT DETECTED NOT DETECTED   Klebsiella oxytoca NOT DETECTED NOT DETECTED   Klebsiella pneumoniae NOT DETECTED NOT DETECTED   Proteus species NOT DETECTED NOT DETECTED   Salmonella species NOT DETECTED NOT DETECTED   Serratia marcescens NOT DETECTED NOT DETECTED   Haemophilus influenzae NOT DETECTED NOT DETECTED   Neisseria meningitidis NOT DETECTED NOT  DETECTED   Pseudomonas aeruginosa NOT DETECTED NOT DETECTED   Stenotrophomonas maltophilia NOT DETECTED NOT DETECTED   Candida albicans NOT DETECTED NOT DETECTED   Candida auris NOT DETECTED NOT DETECTED   Candida glabrata NOT DETECTED NOT DETECTED   Candida krusei NOT DETECTED NOT DETECTED   Candida parapsilosis NOT DETECTED NOT DETECTED   Candida tropicalis NOT DETECTED NOT DETECTED   Cryptococcus neoformans/gattii NOT DETECTED NOT DETECTED    Stefano MARLA Bologna, PharmD, BCPS Clinical Pharmacist 12/29/2023 8:59 AM

## 2023-12-30 ENCOUNTER — Encounter (HOSPITAL_COMMUNITY): Payer: Self-pay | Admitting: Anesthesiology

## 2023-12-30 ENCOUNTER — Encounter (HOSPITAL_COMMUNITY)
Admission: EM | Disposition: A | Discharge status: Home / Self Care | Payer: Self-pay | Source: Home / Self Care | Attending: Student

## 2023-12-30 DIAGNOSIS — K851 Biliary acute pancreatitis without necrosis or infection: Secondary | ICD-10-CM | POA: Diagnosis not present

## 2023-12-30 DIAGNOSIS — K8050 Calculus of bile duct without cholangitis or cholecystitis without obstruction: Secondary | ICD-10-CM | POA: Diagnosis not present

## 2023-12-30 DIAGNOSIS — R7989 Other specified abnormal findings of blood chemistry: Secondary | ICD-10-CM | POA: Diagnosis not present

## 2023-12-30 DIAGNOSIS — Z9884 Bariatric surgery status: Secondary | ICD-10-CM

## 2023-12-30 LAB — COMPREHENSIVE METABOLIC PANEL WITH GFR
ALT: 125 U/L — ABNORMAL HIGH (ref 0–44)
AST: 101 U/L — ABNORMAL HIGH (ref 15–41)
Albumin: 2.5 g/dL — ABNORMAL LOW (ref 3.5–5.0)
Alkaline Phosphatase: 229 U/L — ABNORMAL HIGH (ref 38–126)
Anion gap: 11 (ref 5–15)
BUN: 23 mg/dL (ref 8–23)
CO2: 21 mmol/L — ABNORMAL LOW (ref 22–32)
Calcium: 7.4 mg/dL — ABNORMAL LOW (ref 8.9–10.3)
Chloride: 106 mmol/L (ref 98–111)
Creatinine, Ser: 1.01 mg/dL — ABNORMAL HIGH (ref 0.44–1.00)
GFR, Estimated: >60 mL/min (ref >60)
Glucose, Bld: 94 mg/dL (ref 70–99)
Potassium: 3.9 mmol/L (ref 3.5–5.1)
Sodium: 138 mmol/L (ref 135–145)
Total Bilirubin: 2.8 mg/dL — ABNORMAL HIGH (ref 0.0–1.2)
Total Protein: 6.1 g/dL — ABNORMAL LOW (ref 6.5–8.1)

## 2023-12-30 LAB — CBC
HCT: 30.8 % — ABNORMAL LOW (ref 36.0–46.0)
Hemoglobin: 9.5 g/dL — ABNORMAL LOW (ref 12.0–15.0)
MCH: 24.9 pg — ABNORMAL LOW (ref 26.0–34.0)
MCHC: 30.8 g/dL (ref 30.0–36.0)
MCV: 80.8 fL (ref 80.0–100.0)
Platelets: 218 K/uL (ref 150–400)
RBC: 3.81 MIL/uL — ABNORMAL LOW (ref 3.87–5.11)
RDW: 20 % — ABNORMAL HIGH (ref 11.5–15.5)
WBC: 12.4 K/uL — ABNORMAL HIGH (ref 4.0–10.5)
nRBC: 0 % (ref 0.0–0.2)

## 2023-12-30 LAB — LIPASE, BLOOD: Lipase: 29 U/L (ref 11–51)

## 2023-12-30 SURGERY — ERCP, WITH INTERVENTION IF INDICATED
Anesthesia: General

## 2023-12-30 MED ORDER — ATOGEPANT 60 MG PO TABS
1.0000 | ORAL_TABLET | Freq: Every day | ORAL | Status: DC
  Administered 2023-12-30 – 2023-12-31 (×2): 60 mg via ORAL
  Filled 2023-12-30 (×2): qty 1

## 2023-12-30 MED ORDER — DICLOFENAC SODIUM 1 % EX GEL
4.0000 g | Freq: Four times a day (QID) | CUTANEOUS | Status: DC | PRN
  Administered 2023-12-30: 4 g via TOPICAL
  Filled 2023-12-30: qty 100

## 2023-12-30 MED ORDER — SODIUM CHLORIDE 0.9% FLUSH: 10.0000 mL | INTRAVENOUS | Status: DC | PRN

## 2023-12-30 MED ORDER — LACTATED RINGERS IV SOLN: INTRAVENOUS | Status: AC
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
## 2023-12-30 NOTE — Plan of Care (Signed)
  Problem: Nutrition: Goal: Adequate nutrition will be maintained Outcome: Progressing   Problem: Safety: Goal: Ability to remain free from injury will improve Outcome: Progressing   

## 2023-12-30 NOTE — Progress Notes (Signed)
 PROGRESS NOTE    Kimberly Castro  FMW:[id number] DOB: 1961-03-11 DOA: 12/28/2023 PCP: Corlis Pagan, NP   Brief Narrative:  63 y.o. female with medical history significant for fibromyalgia, ADD, depression, HTN, sleep apnea, IBS, CKD, HLD, chronic diastolic HF, morbid obesity, hx SBO and gastric bypass, and anxiety presented with abdominal pain, nausea and vomiting.  On presentation, WBC was 21.8, AST of 150, ALT of 228, bilirubin of 3, lipase of 261.  CT of abdomen and pelvis showed moderate gallbladder distention but no cholelithiasis.  MRCP showed acute cholecystitis and possible choledocholithiasis.  She was started on IV antibiotics.  General surgery was consulted who recommended GI consultation as well.  Assessment & Plan:   Acute cholecystitis Gallstone pancreatitis Choledocholithiasis Elevated LFTs - Imaging as above.  Continue broad-spectrum antibiotics and IV fluids.  - LFTs improving with improving WBCs and no temperature spikes of the last 24 hours. - GI and general surgery following.  Will need lap chole with cholangiogram and might need ERCP as well. --Continue pain management and antiemetics as needed - Repeat a.m. LFTs  Chronic diastolic CHF  hypertension - Currently compensated.  Blood pressure currently stable.  Continue metoprolol .  Hold irbesartan  and spironolactone .  Strict input output.  Daily weights - Cardiology consulted as per general surgery recommendations.  Further recommendations  Leukocytosis - Improving  Fibromyalgia - Continue Lyrica   Anxiety and depression -Continue bupropion  and Effexor   Allergic rhinitis - Continue loratadine  and Flonase   History of migraines - Continue atogepant   OSA - Continue CPAP at bedtime  Morbid obesity class III - Outpatient follow-up  DVT prophylaxis: Lovenox  Code Status: Full Family Communication: Husband at bedside Disposition Plan: Status is: Inpatient Remains inpatient appropriate because: Of  severity of illness    Consultants: General Surgery/GI/cardiology  Procedures: None  Antimicrobials: Zosyn  from 12/28/2023 onwards   Subjective: Patient seen and examined at bedside.  Denies any fever, chest pain or shortness of breath.  Still having intermittent abdominal pain but improving.  Complains of back pain.  Objective: Vitals:   12/29/23 2145 12/29/23 2146 12/30/23 0421 12/30/23 0500  BP: 131/65 131/65 (!) 143/74   Pulse: 82 82 70   Resp: 18  18   Temp:   98.7 F (37.1 C)   TempSrc:   Oral   SpO2:   93%   Weight:    (!) 166.9 kg  Height:        Intake/Output Summary (Last 24 hours) at 12/30/2023 0758 Last data filed at 12/30/2023 0553 Gross per 24 hour  Intake 2005.99 ml  Output --  Net 2005.99 ml   Filed Weights   12/28/23 0941 12/28/23 2037 12/30/23 0500  Weight: (!) 145.2 kg (!) 145.2 kg (!) 166.9 kg    Examination:  General: On room air.  No distress ENT/neck: No thyromegaly.  JVD is not elevated  respiratory: Decreased breath sounds at bases bilaterally with some crackles; no wheezing  CVS: S1-S2 heard, rate controlled currently Abdominal: Soft, morbidly obese, tender in the left upper quadrant, slightly distended; no organomegaly, bowel sounds are heard Extremities: Trace lower extremity edema; no cyanosis  CNS: Awake and alert.  No focal neurologic deficit.  Moves extremities Lymph: No obvious lymphadenopathy Skin: No obvious ecchymosis/lesions  psych: Flat affect.  Not agitated musculoskeletal: No obvious joint swelling/deformity     Data Reviewed: I have personally reviewed following labs and imaging studies  CBC: Recent Labs  Lab 12/28/23 0947 12/29/23 0610 12/29/23 1525 12/30/23 0030  WBC 21.8* 14.1*  --  12.4*  NEUTROABS 19.8*  --   --   --   HGB 11.8* 10.3* 10.0* 9.5*  HCT 37.2 33.5* 32.4* 30.8*  MCV 78.8* 81.9  --  80.8  PLT 344 261  --  218   Basic Metabolic Panel: Recent Labs  Lab 12/28/23 0947 12/29/23 0610  12/29/23 1525 12/30/23 0030  NA 137 136 137 138  K 4.0 4.2 4.3 3.9  CL 99 103 105 106  CO2 24 25 24  21*  GLUCOSE 146* 98 93 94  BUN 20 27* 27* 23  CREATININE 1.06* 1.31* 1.30* 1.01*  CALCIUM  8.6* 7.2* 7.4* 7.4*   GFR: Estimated Creatinine Clearance: 90.8 mL/min (A) (by C-G formula based on SCr of 1.01 mg/dL (H)). Liver Function Tests: Recent Labs  Lab 12/28/23 0947 12/29/23 0610 12/30/23 0030  AST 150* 145* 101*  ALT 228* 163* 125*  ALKPHOS 384* 257* 229*  BILITOT 3.0* 4.1* 2.8*  PROT 7.7 6.6 6.1*  ALBUMIN  4.0 3.0* 2.5*   Recent Labs  Lab 12/28/23 0947 12/30/23 0030  LIPASE 261* 29   No results for input(s): AMMONIA in the last 168 hours. Coagulation Profile: Recent Labs  Lab 12/29/23 1525  INR 1.0   Cardiac Enzymes: No results for input(s): CKTOTAL, CKMB, CKMBINDEX, TROPONINI in the last 168 hours. BNP (last 3 results) No results for input(s): PROBNP in the last 8760 hours. HbA1C: No results for input(s): HGBA1C in the last 72 hours. CBG: No results for input(s): GLUCAP in the last 168 hours. Lipid Profile: No results for input(s): CHOL, HDL, LDLCALC, TRIG, CHOLHDL, LDLDIRECT in the last 72 hours. Thyroid Function Tests: No results for input(s): TSH, T4TOTAL, FREET4, T3FREE, THYROIDAB in the last 72 hours. Anemia Panel: No results for input(s): VITAMINB12, FOLATE, FERRITIN, TIBC, IRON, RETICCTPCT in the last 72 hours. Sepsis Labs: No results for input(s): PROCALCITON, LATICACIDVEN in the last 168 hours.  Recent Results (from the past 240 hours)  Culture, blood (single)     Status: None (Preliminary result)   Collection Time: 12/28/23  9:46 AM   Specimen: BLOOD  Result Value Ref Range Status   Specimen Description   Final    BLOOD LEFT ANTECUBITAL Performed at [facility], [address], [place], [place] [address]    Special Requests   Final    BOTTLES DRAWN AEROBIC AND  ANAEROBIC Blood Culture adequate volume Performed at [facility], [address] [place], [place] [address]    Culture  Setup Time   Final    GRAM POSITIVE COCCI ANAEROBIC BOTTLE ONLY CRITICAL RESULT CALLED TO, READ BACK BY AND VERIFIED WITH: PHARMD ABBY BETHENE [phone] AT 0836 BY EC Performed at [facility], [address], [place], [place] [address]    Culture GRAM POSITIVE COCCI  Final   Report Status PENDING  Incomplete  Blood Culture ID Panel (Reflexed)     Status: Abnormal   Collection Time: 12/28/23  9:46 AM  Result Value Ref Range Status   Enterococcus faecalis NOT DETECTED NOT DETECTED Final   Enterococcus Faecium NOT DETECTED NOT DETECTED Final   Listeria monocytogenes NOT DETECTED NOT DETECTED Final   Staphylococcus species NOT DETECTED NOT DETECTED Final   Staphylococcus aureus (BCID) NOT DETECTED NOT DETECTED Final   Staphylococcus epidermidis NOT DETECTED NOT DETECTED Final   Staphylococcus lugdunensis NOT DETECTED NOT DETECTED Final   Streptococcus species DETECTED (A) NOT DETECTED Final    Comment: Not Enterococcus species, Streptococcus agalactiae, Streptococcus pyogenes, or Streptococcus pneumoniae. CRITICAL RESULT  CALLED TO, READ BACK BY AND VERIFIED WITH: PHARMD ABBY BETHENE [phone] AT 0836 BY EC    Streptococcus agalactiae NOT DETECTED NOT DETECTED Final   Streptococcus pneumoniae NOT DETECTED NOT DETECTED Final   Streptococcus pyogenes NOT DETECTED NOT DETECTED Final   A.calcoaceticus-baumannii NOT DETECTED NOT DETECTED Final   Bacteroides fragilis NOT DETECTED NOT DETECTED Final   Enterobacterales NOT DETECTED NOT DETECTED Final   Enterobacter cloacae complex NOT DETECTED NOT DETECTED Final   Escherichia coli NOT DETECTED NOT DETECTED Final   Klebsiella aerogenes NOT DETECTED NOT DETECTED Final   Klebsiella oxytoca NOT DETECTED NOT DETECTED Final   Klebsiella pneumoniae NOT DETECTED NOT DETECTED Final   Proteus species NOT  DETECTED NOT DETECTED Final   Salmonella species NOT DETECTED NOT DETECTED Final   Serratia marcescens NOT DETECTED NOT DETECTED Final   Haemophilus influenzae NOT DETECTED NOT DETECTED Final   Neisseria meningitidis NOT DETECTED NOT DETECTED Final   Pseudomonas aeruginosa NOT DETECTED NOT DETECTED Final   Stenotrophomonas maltophilia NOT DETECTED NOT DETECTED Final   Candida albicans NOT DETECTED NOT DETECTED Final   Candida auris NOT DETECTED NOT DETECTED Final   Candida glabrata NOT DETECTED NOT DETECTED Final   Candida krusei NOT DETECTED NOT DETECTED Final   Candida parapsilosis NOT DETECTED NOT DETECTED Final   Candida tropicalis NOT DETECTED NOT DETECTED Final   Cryptococcus neoformans/gattii NOT DETECTED NOT DETECTED Final    Comment: Performed at [facility], [address] [place], [place] [address]         Radiology Studies: DG CHEST PORT 1 VIEW Result Date: 12/29/2023 CLINICAL DATA:  Cough.  History of pancreatitis. EXAM: PORTABLE CHEST 1 VIEW COMPARISON:  Abdominal CT scan 12/28/2018 FINDINGS: Borderline cardiac enlargement given the AP projection portable technique. Calcification of the thoracic aorta noted. Streaky bibasilar scarring or atelectasis but no infiltrates, effusions or pulmonary edema. No pneumothorax or pulmonary lesions. IMPRESSION: Streaky bibasilar scarring or atelectasis. Electronically Signed   By: MYRTIS Stammer M.D.   On: 12/29/2023 16:36   MR ABDOMEN WITH MRCP W CONTRAST Result Date: 12/28/2023 CLINICAL DATA:  Right upper abdominal pain with gallbladder distension on same day CT EXAM: MRI ABDOMEN WITH CONTRAST (WITH MRCP) TECHNIQUE: Multiplanar multisequence MR imaging of the abdomen was performed following the administration of intravenous contrast. Heavily T2-weighted images of the biliary and pancreatic ducts were obtained, and three-dimensional MRCP images were rendered by post processing. CONTRAST:  10mL GADAVIST  GADOBUTROL  1 MMOL/ML IV SOLN  COMPARISON:  Same day CT abdomen and pelvis FINDINGS: Postcontrast sequences are motion degraded. Lower chest: No acute findings.  Multichamber cardiomegaly. Hepatobiliary: No mass or other parenchymal abnormality identified. Markedly distended gallbladder contains innumerable stones. Mild gallbladder mural thickening and trace pericholecystic free fluid. The cystic duct appears dilated, measuring up to 1.7 cm. No dilation of the common bile duct, which appears to contain a 4 mm filling defect in the downstream duct (19:14). Pancreas: No mass, inflammatory changes, or other parenchymal abnormality identified. Spleen:  Enlarged spleen measures 15.2 cm. Adrenals/Urinary Tract: No adrenal nodules. No suspicious renal masses identified. No evidence of hydronephrosis. Bilateral subcentimeter T2 hyperintense simple cysts. No specific follow-up imaging recommended. Stomach/Bowel: Postsurgical changes of Roux-en-Y gastric bypass. Small hiatal hernia. Vascular/Lymphatic: No pathologically enlarged lymph nodes identified. No abdominal aortic aneurysm demonstrated. Other:  None. Musculoskeletal: No suspicious bone lesions identified. IMPRESSION: 1. Markedly distended gallbladder contains innumerable stones with mild gallbladder mural thickening and trace pericholecystic free fluid, suspicious for acute cholecystitis. 2. Nondilated  common bile duct, which appears to contain a 4 mm filling defect in the downstream duct, suspicious for choledocholithiasis. 3. Splenomegaly. 4. Multichamber cardiomegaly. 5. Small hiatal hernia. Electronically Signed   By: Limin  Xu M.D.   On: 12/28/2023 16:57   CT ABDOMEN PELVIS W CONTRAST Result Date: 12/28/2023 CLINICAL DATA:  Acute generalized abdominal pain. EXAM: CT ABDOMEN AND PELVIS WITH CONTRAST TECHNIQUE: Multidetector CT imaging of the abdomen and pelvis was performed using the standard protocol following bolus administration of intravenous contrast. RADIATION DOSE REDUCTION: This exam  was performed according to the departmental dose-optimization program which includes automated exposure control, adjustment of the mA and/or kV according to patient size and/or use of iterative reconstruction technique. CONTRAST:  OMNIPAQUE  IOHEXOL  300 MG/ML  SOLN COMPARISON:  January 30, 2020.  August 12, 2016. FINDINGS: Lower chest: No acute abnormality. Hepatobiliary: Moderate gallbladder distention is noted. No cholelithiasis or biliary dilatation is noted. Liver is unremarkable. Pancreas: Unremarkable. No pancreatic ductal dilatation or surrounding inflammatory changes. Spleen: Normal in size without focal abnormality. Adrenals/Urinary Tract: Adrenal glands are unremarkable. Kidneys are normal, without renal calculi, focal lesion, or hydronephrosis. Bladder is unremarkable. Stomach/Bowel: Small sliding-type hiatal hernia. Status post gastric bypass. The appendix appears normal. There is no evidence of bowel obstruction or inflammation. Vascular/Lymphatic: Aortic atherosclerosis. No enlarged abdominal or pelvic lymph nodes. Reproductive: Status post hysterectomy. No adnexal masses. Other: No ascites or hernia is noted. Musculoskeletal: No acute or significant osseous findings. IMPRESSION: Moderate gallbladder distension. No cholelithiasis or inflammatory changes are noted. Small sliding-type hiatal hernia. Status post gastric bypass. Aortic Atherosclerosis (ICD10-I70.0). Electronically Signed   By: Lynwood Landy Raddle M.D.   On: 12/28/2023 11:38        Scheduled Meds:  Atogepant   1 tablet Oral Daily   buPROPion   300 mg Oral Daily   cholecalciferol   5,000 Units Oral Daily   enoxaparin  (LOVENOX ) injection  40 mg Subcutaneous Q24H   fluticasone   1 spray Each Nare Daily   loratadine   10 mg Oral Daily   metoprolol  tartrate  25 mg Oral BID   pregabalin   150 mg Oral BID   venlafaxine  XR  225 mg Oral Q breakfast   Continuous Infusions:  lactated ringers  150 mL/hr at 12/30/23 0315    piperacillin -tazobactam (ZOSYN )  IV 3.375 g (12/29/23 2313)          Sophie Mao, MD [facility] 12/30/2023, 7:58 AM

## 2023-12-30 NOTE — Progress Notes (Signed)
   12/30/23 2248  BiPAP/CPAP/SIPAP  BiPAP/CPAP/SIPAP Pt Type Adult  Mask Type Full face mask  Dentures removed? Not applicable  Respiratory Rate 18 breaths/min  FiO2 (%) 21 %  Patient Home Machine No  Patient Home Mask No  Patient Home Tubing No  Auto Titrate Yes  Minimum cmH2O 9 cmH2O  Maximum cmH2O 20 cmH2O  Device Plugged into [employer] Power Outlet Yes

## 2023-12-30 NOTE — Progress Notes (Signed)
 Subjective/Chief Complaint: Pain resolving, tearful at being in hospital   Objective: Vital signs in last 24 hours: Temp:  [97.4 F (36.3 C)-99 F (37.2 C)] 97.4 F (36.3 C) (07/13 0822) Pulse Rate:  [70-82] 79 (07/13 0822) Resp:  [18] 18 (07/13 0822) BP: (101-156)/(50-75) 156/75 (07/13 0822) SpO2:  [93 %-98 %] 97 % (07/13 0822) FiO2 (%):  [21 %] 21 % (07/12 2246) Weight:  [166.9 kg] 166.9 kg (07/13 0500) Last BM Date : 12/29/23  Intake/Output from previous day: 07/12 0701 - 07/13 0700 In: 2006 [P.O.:480; I.V.:1472.4; IV Piggyback:53.6] Out: -  Intake/Output this shift: No intake/output data recorded.  Ab soft nontender nondistended  Lab Results:  Recent Labs    12/29/23 0610 12/29/23 1525 12/30/23 0030  WBC 14.1*  --  12.4*  HGB 10.3* 10.0* 9.5*  HCT 33.5* 32.4* 30.8*  PLT 261  --  218   BMET Recent Labs    12/29/23 1525 12/30/23 0030  NA 137 138  K 4.3 3.9  CL 105 106  CO2 24 21*  GLUCOSE 93 94  BUN 27* 23  CREATININE 1.30* 1.01*  CALCIUM  7.4* 7.4*   PT/INR Recent Labs    12/29/23 1525  LABPROT 14.1  INR 1.0   ABG No results for input(s): PHART, HCO3 in the last 72 hours.  Invalid input(s): PCO2, PO2  Studies/Results: DG CHEST PORT 1 VIEW Result Date: 12/29/2023 CLINICAL DATA:  Cough.  History of pancreatitis. EXAM: PORTABLE CHEST 1 VIEW COMPARISON:  Abdominal CT scan 12/28/2018 FINDINGS: Borderline cardiac enlargement given the AP projection portable technique. Calcification of the thoracic aorta noted. Streaky bibasilar scarring or atelectasis but no infiltrates, effusions or pulmonary edema. No pneumothorax or pulmonary lesions. IMPRESSION: Streaky bibasilar scarring or atelectasis. Electronically Signed   By: MYRTIS Stammer M.D.   On: 12/29/2023 16:36   MR ABDOMEN WITH MRCP W CONTRAST Result Date: 12/28/2023 CLINICAL DATA:  Right upper abdominal pain with gallbladder distension on same day CT EXAM: MRI ABDOMEN WITH CONTRAST (WITH  MRCP) TECHNIQUE: Multiplanar multisequence MR imaging of the abdomen was performed following the administration of intravenous contrast. Heavily T2-weighted images of the biliary and pancreatic ducts were obtained, and three-dimensional MRCP images were rendered by post processing. CONTRAST:  10mL GADAVIST  GADOBUTROL  1 MMOL/ML IV SOLN COMPARISON:  Same day CT abdomen and pelvis FINDINGS: Postcontrast sequences are motion degraded. Lower chest: No acute findings.  Multichamber cardiomegaly. Hepatobiliary: No mass or other parenchymal abnormality identified. Markedly distended gallbladder contains innumerable stones. Mild gallbladder mural thickening and trace pericholecystic free fluid. The cystic duct appears dilated, measuring up to 1.7 cm. No dilation of the common bile duct, which appears to contain a 4 mm filling defect in the downstream duct (19:14). Pancreas: No mass, inflammatory changes, or other parenchymal abnormality identified. Spleen:  Enlarged spleen measures 15.2 cm. Adrenals/Urinary Tract: No adrenal nodules. No suspicious renal masses identified. No evidence of hydronephrosis. Bilateral subcentimeter T2 hyperintense simple cysts. No specific follow-up imaging recommended. Stomach/Bowel: Postsurgical changes of Roux-en-Y gastric bypass. Small hiatal hernia. Vascular/Lymphatic: No pathologically enlarged lymph nodes identified. No abdominal aortic aneurysm demonstrated. Other:  None. Musculoskeletal: No suspicious bone lesions identified. IMPRESSION: 1. Markedly distended gallbladder contains innumerable stones with mild gallbladder mural thickening and trace pericholecystic free fluid, suspicious for acute cholecystitis. 2. Nondilated common bile duct, which appears to contain a 4 mm filling defect in the downstream duct, suspicious for choledocholithiasis. 3. Splenomegaly. 4. Multichamber cardiomegaly. 5. Small hiatal hernia. Electronically Signed   By: Limin  Xu M.D.   On: 12/28/2023 16:57   CT  ABDOMEN PELVIS W CONTRAST Result Date: 12/28/2023 CLINICAL DATA:  Acute generalized abdominal pain. EXAM: CT ABDOMEN AND PELVIS WITH CONTRAST TECHNIQUE: Multidetector CT imaging of the abdomen and pelvis was performed using the standard protocol following bolus administration of intravenous contrast. RADIATION DOSE REDUCTION: This exam was performed according to the departmental dose-optimization program which includes automated exposure control, adjustment of the mA and/or kV according to patient size and/or use of iterative reconstruction technique. CONTRAST:  OMNIPAQUE  IOHEXOL  300 MG/ML  SOLN COMPARISON:  January 30, 2020.  August 12, 2016. FINDINGS: Lower chest: No acute abnormality. Hepatobiliary: Moderate gallbladder distention is noted. No cholelithiasis or biliary dilatation is noted. Liver is unremarkable. Pancreas: Unremarkable. No pancreatic ductal dilatation or surrounding inflammatory changes. Spleen: Normal in size without focal abnormality. Adrenals/Urinary Tract: Adrenal glands are unremarkable. Kidneys are normal, without renal calculi, focal lesion, or hydronephrosis. Bladder is unremarkable. Stomach/Bowel: Small sliding-type hiatal hernia. Status post gastric bypass. The appendix appears normal. There is no evidence of bowel obstruction or inflammation. Vascular/Lymphatic: Aortic atherosclerosis. No enlarged abdominal or pelvic lymph nodes. Reproductive: Status post hysterectomy. No adnexal masses. Other: No ascites or hernia is noted. Musculoskeletal: No acute or significant osseous findings. IMPRESSION: Moderate gallbladder distension. No cholelithiasis or inflammatory changes are noted. Small sliding-type hiatal hernia. Status post gastric bypass. Aortic Atherosclerosis (ICD10-I70.0). Electronically Signed   By: Lynwood Landy Raddle M.D.   On: 12/28/2023 11:38    Anti-infectives: Anti-infectives (From admission, onward)    Start     Dose/Rate Route Frequency Ordered Stop   12/28/23  2345  piperacillin -tazobactam (ZOSYN ) IVPB 3.375 g        3.375 g 12.5 mL/hr over 240 Minutes Intravenous Every 8 hours 12/28/23 2251     12/28/23 1045  piperacillin -tazobactam (ZOSYN ) IVPB 3.375 g        3.375 g 100 mL/hr over 30 Minutes Intravenous  Once 12/28/23 1040 12/28/23 1146       Assessment/Plan: Resolved GSP Choledocholithiasis with improving lfts -await cards note -lfts improving, lipase normal, pain resolved -I think she likely has passed a stone. Will plan to recheck in am. If still improving I dont necessarily think will need ercp and can just have lap chole and a cholangiogram  I reviewed hospitalist notes, last 24 h vitals and pain scores, last 48 h intake and output, last 24 h labs and trends, and last 24 h imaging results.     Kimberly Castro 12/30/2023

## 2023-12-30 NOTE — Progress Notes (Addendum)
 Gastroenterology Inpatient Follow-up Note Covering for Dr. Mann/Dr. Rollin   PATIENT IDENTIFICATION  Kimberly Castro is a 63 y.o. female Hospital Day: 3  SUBJECTIVE  The patient's chart has been reviewed. The patient's labs have been reviewed.  Patient's LFTs are downtrending today. Today, the patient is feeling somewhat better in regards to pain and discomfort. Her family member is with her today. The patient denies fevers or chills.   OBJECTIVE   Scheduled Inpatient Medications:   Atogepant   1 tablet Oral Daily   buPROPion   300 mg Oral Daily   cholecalciferol   5,000 Units Oral Daily   enoxaparin  (LOVENOX ) injection  40 mg Subcutaneous Q24H   fluticasone   1 spray Each Nare Daily   loratadine   10 mg Oral Daily   metoprolol  tartrate  25 mg Oral BID   pregabalin   150 mg Oral BID   venlafaxine  XR  225 mg Oral Q breakfast   Continuous Inpatient Infusions:   lactated ringers  150 mL/hr at 12/30/23 0315   piperacillin -tazobactam (ZOSYN )  IV 3.375 g (12/29/23 2313)   PRN Inpatient Medications: acetaminophen  **OR** acetaminophen , HYDROmorphone  (DILAUDID ) injection, ondansetron  **OR** ondansetron  (ZOFRAN ) IV, senna-docusate, sodium chloride  flush, zolpidem    Physical Examination   Temp:  [98.3 F (36.8 C)-99 F (37.2 C)] 98.7 F (37.1 C) (07/13 0421) Pulse Rate:  [70-82] 70 (07/13 0421) Resp:  [18] 18 (07/13 0421) BP: (101-151)/(50-74) 143/74 (07/13 0421) SpO2:  [93 %-98 %] 93 % (07/13 0421) FiO2 (%):  [21 %] 21 % (07/12 2246) Temp (24hrs), Avg:98.7 F (37.1 C), Min:98.3 F (36.8 C), Max:99 F (37.2 C)  Weight: (!) 145.2 kg GEN: Appears chronically ill, but is improved from yesterday and in no acute distress, family at bedside  PSYCH: Cooperative, without pressured speech EYE: Icteric sclerae ENT: MMM CV: Nontachycardic RESP: No audible wheezing GI: NABS, soft, protuberant abdomen, rounded, TTP in MEG upon deep palpation, no rebound MSK/EXT: Bilateral lower  extremity edema present SKIN: Jaundiced NEURO:  Alert & Oriented x 3, no focal deficits   Review of Data   Laboratory Studies   Recent Labs  Lab 12/30/23 0030  NA 138  K 3.9  CL 106  CO2 21*  BUN 23  CREATININE 1.01*  GLUCOSE 94  CALCIUM  7.4*   Recent Labs  Lab 12/30/23 0030  AST 101*  ALT 125*  ALKPHOS 229*    Recent Labs  Lab 12/28/23 0947 12/29/23 0610 12/29/23 1525 12/30/23 0030  WBC 21.8* 14.1*  --  12.4*  HGB 11.8* 10.3*   < > 9.5*  HCT 37.2 33.5*   < > 30.8*  PLT 344 261  --  218   < > = values in this interval not displayed.   Recent Labs  Lab 12/29/23 1525  INR 1.0    Imaging Studies   DG CHEST PORT 1 VIEW Result Date: 12/29/2023 CLINICAL DATA:  Cough.  History of pancreatitis. EXAM: PORTABLE CHEST 1 VIEW COMPARISON:  Abdominal CT scan 12/28/2018 FINDINGS: Borderline cardiac enlargement given the AP projection portable technique. Calcification of the thoracic aorta noted. Streaky bibasilar scarring or atelectasis but no infiltrates, effusions or pulmonary edema. No pneumothorax or pulmonary lesions. IMPRESSION: Streaky bibasilar scarring or atelectasis. Electronically Signed   By: MYRTIS Stammer M.D.   On: 12/29/2023 16:36   MR ABDOMEN WITH MRCP W CONTRAST Result Date: 12/28/2023 CLINICAL DATA:  Right upper abdominal pain with gallbladder distension on same day CT EXAM: MRI ABDOMEN WITH CONTRAST (WITH MRCP) TECHNIQUE: Multiplanar multisequence MR imaging  of the abdomen was performed following the administration of intravenous contrast. Heavily T2-weighted images of the biliary and pancreatic ducts were obtained, and three-dimensional MRCP images were rendered by post processing. CONTRAST:  10mL GADAVIST  GADOBUTROL  1 MMOL/ML IV SOLN COMPARISON:  Same day CT abdomen and pelvis FINDINGS: Postcontrast sequences are motion degraded. Lower chest: No acute findings.  Multichamber cardiomegaly. Hepatobiliary: No mass or other parenchymal abnormality identified.  Markedly distended gallbladder contains innumerable stones. Mild gallbladder mural thickening and trace pericholecystic free fluid. The cystic duct appears dilated, measuring up to 1.7 cm. No dilation of the common bile duct, which appears to contain a 4 mm filling defect in the downstream duct (19:14). Pancreas: No mass, inflammatory changes, or other parenchymal abnormality identified. Spleen:  Enlarged spleen measures 15.2 cm. Adrenals/Urinary Tract: No adrenal nodules. No suspicious renal masses identified. No evidence of hydronephrosis. Bilateral subcentimeter T2 hyperintense simple cysts. No specific follow-up imaging recommended. Stomach/Bowel: Postsurgical changes of Roux-en-Y gastric bypass. Small hiatal hernia. Vascular/Lymphatic: No pathologically enlarged lymph nodes identified. No abdominal aortic aneurysm demonstrated. Other:  None. Musculoskeletal: No suspicious bone lesions identified. IMPRESSION: 1. Markedly distended gallbladder contains innumerable stones with mild gallbladder mural thickening and trace pericholecystic free fluid, suspicious for acute cholecystitis. 2. Nondilated common bile duct, which appears to contain a 4 mm filling defect in the downstream duct, suspicious for choledocholithiasis. 3. Splenomegaly. 4. Multichamber cardiomegaly. 5. Small hiatal hernia. Electronically Signed   By: Limin  Xu M.D.   On: 12/28/2023 16:57   CT ABDOMEN PELVIS W CONTRAST Result Date: 12/28/2023 CLINICAL DATA:  Acute generalized abdominal pain. EXAM: CT ABDOMEN AND PELVIS WITH CONTRAST TECHNIQUE: Multidetector CT imaging of the abdomen and pelvis was performed using the standard protocol following bolus administration of intravenous contrast. RADIATION DOSE REDUCTION: This exam was performed according to the departmental dose-optimization program which includes automated exposure control, adjustment of the mA and/or kV according to patient size and/or use of iterative reconstruction technique.  CONTRAST:  OMNIPAQUE  IOHEXOL  300 MG/ML  SOLN COMPARISON:  January 30, 2020.  August 12, 2016. FINDINGS: Lower chest: No acute abnormality. Hepatobiliary: Moderate gallbladder distention is noted. No cholelithiasis or biliary dilatation is noted. Liver is unremarkable. Pancreas: Unremarkable. No pancreatic ductal dilatation or surrounding inflammatory changes. Spleen: Normal in size without focal abnormality. Adrenals/Urinary Tract: Adrenal glands are unremarkable. Kidneys are normal, without renal calculi, focal lesion, or hydronephrosis. Bladder is unremarkable. Stomach/Bowel: Small sliding-type hiatal hernia. Status post gastric bypass. The appendix appears normal. There is no evidence of bowel obstruction or inflammation. Vascular/Lymphatic: Aortic atherosclerosis. No enlarged abdominal or pelvic lymph nodes. Reproductive: Status post hysterectomy. No adnexal masses. Other: No ascites or hernia is noted. Musculoskeletal: No acute or significant osseous findings. IMPRESSION: Moderate gallbladder distension. No cholelithiasis or inflammatory changes are noted. Small sliding-type hiatal hernia. Status post gastric bypass. Aortic Atherosclerosis (ICD10-I70.0). Electronically Signed   By: Lynwood Landy Raddle M.D.   On: 12/28/2023 11:38   GI Procedures and Studies  No new GI procedures to review   ASSESSMENT  Ms. Polito is a 63 y.o. 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 The GI service is consulted for evaluation and management of pancreatitis thought to be gallstone related with possible choledocholithiasis.   The patient is hemodynamically and clinically stable today.  She seems to be improving from a pancreatitis standpoint.  Overall this appears to be a gallstone pancreatitis.  Hopefully the small stone that was noted on the MRI/MRCP  has passed, with the downtrending LFTs.  It is possible that it could be ball valving.   Patient is being teed up for cholecystectomy with IOC tomorrow with surgery.  Unfortunately I cannot be available this week for intraoperative ERCP.  If she is found to have a stone, she will need to be transferred to a center that performs EDGE ERCP or device assisted ERCP.  Appreciate the surgical service being available.  All patient questions were answered to the best of my ability, and the patient agrees to the aforementioned plan of action with follow-up as indicated.   PLAN/RECOMMENDATIONS  Advance diet as tolerated Follow-up LFTs tomorrow IV fluids for supplementation if oral intake is poor N.p.o. at midnight for possible cholecystectomy tomorrow If patient is found to have choledocholithiasis then will need transfer to quaternary center where EDGE ERCP or device assisted ERCP is possible   Dr. Mann/Dr. Rollin will take this patient's care from a GI perspective tomorrow.  Please page/call with questions or concerns.   Aloha Finner, MD [facility] Office # [phone]    LOS: 2 days  Aloha Finner Raddle  12/30/2023, 5:33 AM

## 2023-12-30 NOTE — OR Nursing (Signed)
 Spoke with floor Nursing staff. Staff unaware pt was not having procedure. I will contact provider for diet orders at [phone] and if patient has questions or concerns please feel free to reach out to ENDO for support. Collene JAYSON Edu [place]ley

## 2023-12-31 ENCOUNTER — Encounter (HOSPITAL_COMMUNITY): Payer: Self-pay | Admitting: Internal Medicine

## 2023-12-31 ENCOUNTER — Inpatient Hospital Stay (HOSPITAL_COMMUNITY): Admitting: Anesthesiology

## 2023-12-31 ENCOUNTER — Other Ambulatory Visit: Payer: Self-pay

## 2023-12-31 ENCOUNTER — Inpatient Hospital Stay (HOSPITAL_COMMUNITY)

## 2023-12-31 ENCOUNTER — Encounter (HOSPITAL_COMMUNITY)
Admission: EM | Disposition: A | Discharge status: Home / Self Care | Payer: Self-pay | Source: Home / Self Care | Attending: Internal Medicine

## 2023-12-31 DIAGNOSIS — K851 Biliary acute pancreatitis without necrosis or infection: Secondary | ICD-10-CM | POA: Diagnosis not present

## 2023-12-31 DIAGNOSIS — Z01810 Encounter for preprocedural cardiovascular examination: Secondary | ICD-10-CM | POA: Diagnosis not present

## 2023-12-31 DIAGNOSIS — I700 Atherosclerosis of aorta: Secondary | ICD-10-CM

## 2023-12-31 DIAGNOSIS — Z6841 Body Mass Index (BMI) 40.0 and over, adult: Secondary | ICD-10-CM | POA: Diagnosis not present

## 2023-12-31 HISTORY — PX: CHOLECYSTECTOMY: SHX55

## 2023-12-31 LAB — CBC WITH DIFFERENTIAL/PLATELET
Abs Immature Granulocytes: 0.05 K/uL (ref 0.00–0.07)
Basophils Absolute: 0 K/uL (ref 0.0–0.1)
Basophils Relative: 0 %
Eosinophils Absolute: 0.3 K/uL (ref 0.0–0.5)
Eosinophils Relative: 3 %
HCT: 30.2 % — ABNORMAL LOW (ref 36.0–46.0)
Hemoglobin: 9.4 g/dL — ABNORMAL LOW (ref 12.0–15.0)
Immature Granulocytes: 1 %
Lymphocytes Relative: 16 %
Lymphs Abs: 1.6 K/uL (ref 0.7–4.0)
MCH: 25.1 pg — ABNORMAL LOW (ref 26.0–34.0)
MCHC: 31.1 g/dL (ref 30.0–36.0)
MCV: 80.7 fL (ref 80.0–100.0)
Monocytes Absolute: 0.9 K/uL (ref 0.1–1.0)
Monocytes Relative: 9 %
Neutro Abs: 7.1 K/uL (ref 1.7–7.7)
Neutrophils Relative %: 71 %
Platelets: 215 K/uL (ref 150–400)
RBC: 3.74 MIL/uL — ABNORMAL LOW (ref 3.87–5.11)
RDW: 20.2 % — ABNORMAL HIGH (ref 11.5–15.5)
WBC: 10.1 K/uL (ref 4.0–10.5)
nRBC: 0 % (ref 0.0–0.2)

## 2023-12-31 LAB — ECHOCARDIOGRAM COMPLETE
AR max vel: 2.15 cm2
AV Area VTI: 2.23 cm2
AV Area mean vel: 2.15 cm2
AV Mean grad: 7 mmHg
AV Peak grad: 14 mmHg
Ao pk vel: 1.87 m/s
Area-P 1/2: 3.2 cm2
Calc EF: 60.3 %
Height: 64 in
S' Lateral: 3.4 cm
Single Plane A2C EF: 61.9 %
Single Plane A4C EF: 59.4 %
Weight: 5887.16 [oz_av]

## 2023-12-31 LAB — COMPREHENSIVE METABOLIC PANEL WITH GFR
ALT: 89 U/L — ABNORMAL HIGH (ref 0–44)
AST: 52 U/L — ABNORMAL HIGH (ref 15–41)
Albumin: 2.3 g/dL — ABNORMAL LOW (ref 3.5–5.0)
Alkaline Phosphatase: 216 U/L — ABNORMAL HIGH (ref 38–126)
Anion gap: 8 (ref 5–15)
BUN: 19 mg/dL (ref 8–23)
CO2: 24 mmol/L (ref 22–32)
Calcium: 7.6 mg/dL — ABNORMAL LOW (ref 8.9–10.3)
Chloride: 106 mmol/L (ref 98–111)
Creatinine, Ser: 0.84 mg/dL (ref 0.44–1.00)
GFR, Estimated: >60 mL/min (ref >60)
Glucose, Bld: 98 mg/dL (ref 70–99)
Potassium: 3.6 mmol/L (ref 3.5–5.1)
Sodium: 138 mmol/L (ref 135–145)
Total Bilirubin: 2.1 mg/dL — ABNORMAL HIGH (ref 0.0–1.2)
Total Protein: 5.9 g/dL — ABNORMAL LOW (ref 6.5–8.1)

## 2023-12-31 LAB — CULTURE, BLOOD (SINGLE): Special Requests: ADEQUATE

## 2023-12-31 LAB — CALCIUM, IONIZED: Calcium, Ionized, Serum: 4.3 mg/dL — ABNORMAL LOW (ref 4.5–5.6)

## 2023-12-31 LAB — MAGNESIUM: Magnesium: 1.5 mg/dL — ABNORMAL LOW (ref 1.7–2.4)

## 2023-12-31 SURGERY — LAPAROSCOPIC CHOLECYSTECTOMY WITH INTRAOPERATIVE CHOLANGIOGRAM
Anesthesia: General | Site: Abdomen

## 2023-12-31 MED ORDER — PROPOFOL 10 MG/ML IV BOLUS
INTRAVENOUS | Status: AC
Start: 2023-12-31 — End: 2023-12-31
  Filled 2023-12-31: qty 20

## 2023-12-31 MED ORDER — BUPIVACAINE-EPINEPHRINE 0.25% -1:200000 IJ SOLN
INTRAMUSCULAR | Status: DC | PRN
  Administered 2023-12-31: 30 mL

## 2023-12-31 MED ORDER — ALBUMIN HUMAN 5 % IV SOLN
INTRAVENOUS | Status: AC
  Filled 2023-12-31: qty 250

## 2023-12-31 MED ORDER — LACTATED RINGERS IR SOLN
Status: DC | PRN
  Administered 2023-12-31: 1000 mL

## 2023-12-31 MED ORDER — ACETAMINOPHEN 500 MG PO TABS
1000.0000 mg | ORAL_TABLET | Freq: Four times a day (QID) | ORAL | Status: DC
  Administered 2023-12-31 – 2024-01-01 (×3): 1000 mg via ORAL
  Filled 2023-12-31 (×3): qty 2

## 2023-12-31 MED ORDER — PROPOFOL 10 MG/ML IV BOLUS
INTRAVENOUS | Status: AC
  Filled 2023-12-31: qty 20

## 2023-12-31 MED ORDER — PROPOFOL 10 MG/ML IV BOLUS
INTRAVENOUS | Status: DC | PRN
  Administered 2023-12-31: 300 mg via INTRAVENOUS

## 2023-12-31 MED ORDER — HYDROMORPHONE HCL 1 MG/ML IJ SOLN
INTRAMUSCULAR | Status: AC
  Filled 2023-12-31: qty 1

## 2023-12-31 MED ORDER — DEXAMETHASONE SODIUM PHOSPHATE 10 MG/ML IJ SOLN
INTRAMUSCULAR | Status: DC | PRN
Start: 2023-12-31 — End: 2023-12-31
  Administered 2023-12-31: 5 mg via INTRAVENOUS

## 2023-12-31 MED ORDER — HYDROMORPHONE HCL 2 MG/ML IJ SOLN
INTRAMUSCULAR | Status: AC
  Filled 2023-12-31: qty 1

## 2023-12-31 MED ORDER — CHLORHEXIDINE GLUCONATE 0.12 % MT SOLN
15.0000 mL | Freq: Once | OROMUCOSAL | Status: AC
  Administered 2023-12-31: 15 mL via OROMUCOSAL

## 2023-12-31 MED ORDER — FENTANYL CITRATE (PF) 250 MCG/5ML IJ SOLN
INTRAMUSCULAR | Status: AC
  Filled 2023-12-31: qty 5

## 2023-12-31 MED ORDER — ROCURONIUM BROMIDE 100 MG/10ML IV SOLN
INTRAVENOUS | Status: DC | PRN
  Administered 2023-12-31: 100 mg via INTRAVENOUS

## 2023-12-31 MED ORDER — LACTATED RINGERS IV SOLN: INTRAVENOUS | Status: DC | PRN

## 2023-12-31 MED ORDER — METHOCARBAMOL 1000 MG/10ML IJ SOLN
500.0000 mg | Freq: Four times a day (QID) | INTRAMUSCULAR | Status: DC | PRN
  Filled 2023-12-31: qty 10

## 2023-12-31 MED ORDER — KETAMINE HCL 10 MG/ML IJ SOLN
INTRAMUSCULAR | Status: DC | PRN
  Administered 2023-12-31: 30 mg via INTRAVENOUS

## 2023-12-31 MED ORDER — HYDRALAZINE HCL 20 MG/ML IJ SOLN
INTRAMUSCULAR | Status: DC | PRN
  Administered 2023-12-31 (×2): 2.5 mg via INTRAVENOUS

## 2023-12-31 MED ORDER — OXYCODONE HCL 5 MG PO TABS: 5.0000 mg | ORAL_TABLET | Freq: Once | ORAL | Status: DC | PRN

## 2023-12-31 MED ORDER — LIDOCAINE HCL (CARDIAC) PF 100 MG/5ML IV SOSY
PREFILLED_SYRINGE | INTRAVENOUS | Status: DC | PRN
  Administered 2023-12-31: 100 mg via INTRAVENOUS

## 2023-12-31 MED ORDER — DEXMEDETOMIDINE HCL IN NACL 80 MCG/20ML IV SOLN
INTRAVENOUS | Status: DC | PRN
  Administered 2023-12-31: 8 ug via INTRAVENOUS

## 2023-12-31 MED ORDER — OXYCODONE HCL 5 MG/5ML PO SOLN: 5.0000 mg | Freq: Once | ORAL | Status: DC | PRN

## 2023-12-31 MED ORDER — BUPIVACAINE-EPINEPHRINE (PF) 0.25% -1:200000 IJ SOLN
INTRAMUSCULAR | Status: AC
  Filled 2023-12-31: qty 30

## 2023-12-31 MED ORDER — ALBUTEROL SULFATE HFA 108 (90 BASE) MCG/ACT IN AERS
INHALATION_SPRAY | RESPIRATORY_TRACT | Status: DC | PRN
Start: 2023-12-31 — End: 2023-12-31
  Administered 2023-12-31: 8 via RESPIRATORY_TRACT

## 2023-12-31 MED ORDER — ALBUMIN HUMAN 5 % IV SOLN
INTRAVENOUS | Status: AC
Start: 2023-12-31 — End: 2023-12-31
  Filled 2023-12-31: qty 250

## 2023-12-31 MED ORDER — HYDRALAZINE HCL 20 MG/ML IJ SOLN
INTRAMUSCULAR | Status: AC
  Filled 2023-12-31: qty 1

## 2023-12-31 MED ORDER — MIDAZOLAM HCL 5 MG/5ML IJ SOLN
INTRAMUSCULAR | Status: DC | PRN
  Administered 2023-12-31 (×2): 1 mg via INTRAVENOUS

## 2023-12-31 MED ORDER — PROCHLORPERAZINE EDISYLATE 10 MG/2ML IJ SOLN: 10.0000 mg | INTRAMUSCULAR | Status: DC | PRN

## 2023-12-31 MED ORDER — HYDROMORPHONE HCL 1 MG/ML IJ SOLN
INTRAMUSCULAR | Status: DC | PRN
  Administered 2023-12-31 (×2): .5 mg via INTRAVENOUS

## 2023-12-31 MED ORDER — 0.9 % SODIUM CHLORIDE (POUR BTL) OPTIME
TOPICAL | Status: DC | PRN
  Administered 2023-12-31: 1000 mL

## 2023-12-31 MED ORDER — HYDROMORPHONE HCL 1 MG/ML IJ SOLN
0.2500 mg | INTRAMUSCULAR | Status: DC | PRN
  Administered 2023-12-31 (×2): 0.5 mg via INTRAVENOUS

## 2023-12-31 MED ORDER — SODIUM CHLORIDE 0.9 % IV SOLN
3.0000 g | Freq: Four times a day (QID) | INTRAVENOUS | Status: DC
  Administered 2023-12-31 – 2024-01-01 (×4): 3 g via INTRAVENOUS
  Filled 2023-12-31 (×5): qty 8

## 2023-12-31 MED ORDER — ONDANSETRON HCL 4 MG/2ML IJ SOLN
INTRAMUSCULAR | Status: DC | PRN
  Administered 2023-12-31: 4 mg via INTRAVENOUS

## 2023-12-31 MED ORDER — LACTATED RINGERS IV SOLN: INTRAVENOUS | Status: DC

## 2023-12-31 MED ORDER — SUGAMMADEX SODIUM 200 MG/2ML IV SOLN
INTRAVENOUS | Status: DC | PRN
  Administered 2023-12-31: 400 mg via INTRAVENOUS

## 2023-12-31 MED ORDER — FENTANYL CITRATE (PF) 100 MCG/2ML IJ SOLN
INTRAMUSCULAR | Status: DC | PRN
  Administered 2023-12-31: 25 ug via INTRAVENOUS
  Administered 2023-12-31 (×2): 50 ug via INTRAVENOUS
  Administered 2023-12-31: 25 ug via INTRAVENOUS
  Administered 2023-12-31: 100 ug via INTRAVENOUS

## 2023-12-31 MED ORDER — ORAL CARE MOUTH RINSE: 15.0000 mL | Freq: Once | OROMUCOSAL | Status: AC

## 2023-12-31 MED ORDER — ONDANSETRON HCL 4 MG/2ML IJ SOLN
INTRAMUSCULAR | Status: AC
  Filled 2023-12-31: qty 2

## 2023-12-31 MED ORDER — DEXMEDETOMIDINE HCL IN NACL 80 MCG/20ML IV SOLN
INTRAVENOUS | Status: AC
  Filled 2023-12-31: qty 20

## 2023-12-31 MED ORDER — OXYCODONE HCL 5 MG PO TABS
5.0000 mg | ORAL_TABLET | ORAL | Status: DC | PRN
  Administered 2023-12-31 (×2): 10 mg via ORAL
  Administered 2024-01-01: 5 mg via ORAL
  Administered 2024-01-01 (×2): 10 mg via ORAL
  Filled 2023-12-31 (×5): qty 2

## 2023-12-31 MED ORDER — METHOCARBAMOL 500 MG PO TABS
500.0000 mg | ORAL_TABLET | Freq: Three times a day (TID) | ORAL | Status: DC | PRN
  Administered 2023-12-31: 500 mg via ORAL
  Filled 2023-12-31: qty 1

## 2023-12-31 MED ORDER — PHENYLEPHRINE HCL-NACL 20-0.9 MG/250ML-% IV SOLN
INTRAVENOUS | Status: DC | PRN
Start: 2023-12-31 — End: 2023-12-31
  Administered 2023-12-31: 30 ug/min via INTRAVENOUS

## 2023-12-31 MED ORDER — AMISULPRIDE (ANTIEMETIC) 5 MG/2ML IV SOLN: 10.0000 mg | Freq: Once | INTRAVENOUS | Status: DC | PRN

## 2023-12-31 MED ORDER — ONDANSETRON HCL 4 MG/2ML IJ SOLN: 4.0000 mg | Freq: Once | INTRAMUSCULAR | Status: DC | PRN

## 2023-12-31 MED ORDER — MIDAZOLAM HCL 2 MG/2ML IJ SOLN
INTRAMUSCULAR | Status: AC
  Filled 2023-12-31: qty 2

## 2023-12-31 SURGICAL SUPPLY — 33 items
BAG COUNTER SPONGE SURGICOUNT (BAG) IMPLANT
CABLE HIGH FREQUENCY MONO STRZ (ELECTRODE) ×1 IMPLANT
CATH URETL OPEN 5X70 (CATHETERS) IMPLANT
CHLORAPREP W/TINT 26 (MISCELLANEOUS) ×1 IMPLANT
CLIP APPLIE ROT 10 11.4 M/L (STAPLE) ×1 IMPLANT
COVER MAYO STAND XLG (MISCELLANEOUS) ×1 IMPLANT
COVER SURGICAL LIGHT HANDLE (MISCELLANEOUS) ×1 IMPLANT
DERMABOND ADVANCED .7 DNX12 (GAUZE/BANDAGES/DRESSINGS) ×1 IMPLANT
DRAPE C-ARM 42X120 X-RAY (DRAPES) IMPLANT
ELECT REM PT RETURN 15FT ADLT (MISCELLANEOUS) ×1 IMPLANT
ENDOLOOP SUT PDS II 0 18 (SUTURE) ×1 IMPLANT
GLOVE BIO SURGEON STRL SZ7.5 (GLOVE) ×1 IMPLANT
GLOVE INDICATOR 8.0 STRL GRN (GLOVE) ×1 IMPLANT
GOWN STRL REUS W/ TWL XL LVL3 (GOWN DISPOSABLE) ×1 IMPLANT
GRASPER SUT TROCAR 14GX15 (MISCELLANEOUS) IMPLANT
HEMOSTAT SNOW SURGICEL 2X4 (HEMOSTASIS) IMPLANT
IRRIGATION SUCT STRKRFLW 2 WTP (MISCELLANEOUS) ×1 IMPLANT
IV CATH 14GX2 1/4 (CATHETERS) ×1 IMPLANT
KIT BASIN OR (CUSTOM PROCEDURE TRAY) ×1 IMPLANT
KIT TURNOVER KIT A (KITS) ×1 IMPLANT
NDL INSUFFLATION 14GA 120MM (NEEDLE) ×1 IMPLANT
NEEDLE INSUFFLATION 14GA 120MM (NEEDLE) ×1 IMPLANT
POUCH RETRIEVAL ECOSAC 10 (ENDOMECHANICALS) ×1 IMPLANT
SCISSORS LAP 5X35 DISP (ENDOMECHANICALS) ×1 IMPLANT
SET TUBE SMOKE EVAC HIGH FLOW (TUBING) ×1 IMPLANT
SLEEVE Z-THREAD 5X100MM (TROCAR) ×2 IMPLANT
SPIKE FLUID TRANSFER (MISCELLANEOUS) ×1 IMPLANT
STOPCOCK 4 WAY LG BORE MALE ST (IV SETS) IMPLANT
SUT MNCRL AB 4-0 PS2 18 (SUTURE) ×1 IMPLANT
TOWEL OR 17X26 10 PK STRL BLUE (TOWEL DISPOSABLE) ×1 IMPLANT
TRAY LAPAROSCOPIC (CUSTOM PROCEDURE TRAY) ×1 IMPLANT
TROCAR ADV FIXATION 12X100MM (TROCAR) ×1 IMPLANT
TROCAR Z-THREAD OPTICAL 5X100M (TROCAR) ×1 IMPLANT

## 2023-12-31 NOTE — Plan of Care (Signed)
  Problem: Education: Goal: Knowledge of General Education information will improve Description: Including pain rating scale, medication(s)/side effects and non-pharmacologic comfort measures 12/31/2023 1553 by Jene Hurl, RN Outcome: Progressing 12/31/2023 0744 by Jene Hurl, RN Outcome: Progressing   Problem: Clinical Measurements: Goal: Ability to maintain clinical measurements within normal limits will improve 12/31/2023 1553 by Jene Hurl, RN Outcome: Progressing 12/31/2023 0744 by Jene Hurl, RN Outcome: Progressing   Problem: Activity: Goal: Risk for activity intolerance will decrease 12/31/2023 1553 by Jene Hurl, RN Outcome: Progressing 12/31/2023 0744 by Jene Hurl, RN Outcome: Progressing   Problem: Pain Managment: Goal: General experience of comfort will improve and/or be controlled 12/31/2023 1553 by Jene Hurl, RN Outcome: Progressing 12/31/2023 0744 by Jene Hurl, RN Outcome: Progressing

## 2023-12-31 NOTE — Op Note (Signed)
 Patient: Kimberly Castro ([birth year], [id number])  Date of Surgery: 12/31/2023  Preoperative Diagnosis: GALLSTONE PANCREATITIS   Postoperative Diagnosis: GALLSTONE PANCREATITIS   Surgical Procedure: LAPAROSCOPIC CHOLECYSTECTOMY - unable to perform IOC due to short, thin walled inflamed cystic duct protected by very large liver    Operative Team Members:  Surgeons and Role:    * Neilah Fulwider, Deward PARAS, MD - Primary   Anesthesiologist: Merla Almarie HERO, DO CRNA: Carleton Garnette SAUNDERS, CRNA; Flynn-Cook, Rollene LABOR, CRNA   Anesthesia: General   Fluids:  Total I/O In: 900 [I.V.:900] Out: 400 [Blood:400]  Complications: None  Drains:  none   Specimen:  ID Type Source Tests Collected by Time Destination  1 : gallbladder Tissue PATH Gallbladder SURGICAL PATHOLOGY Samanthia Howland, Deward PARAS, MD 12/31/2023 1059      Disposition:  PACU - hemodynamically stable.  Plan of Care: Continue inpatient care    Indications for Procedure: Kimberly Castro is a 63 y.o. female with a history of gastric bypasswho presented with abdominal pain.  History, physical and imaging was concerning for gallstone pancreatitis.  After symptoms had resolved, laparoscopic cholecystectomy with intraoperative cholangiogram was recommended for the patient.  The procedure itself, as well as the risks, benefits and alternatives were discussed with the patient.  Risks discussed included but were not limited to the risk of infection, bleeding, damage to nearby structures, need to convert to open procedure, incisional hernia, bile leak, common bile duct injury and the need for additional procedures or surgeries.  With this discussion complete and all questions answered the patient granted consent to proceed.  Findings: Severely inflamed gallbladder with hydrops, short, inflamed cystic duct protected by large liver making it not feasible to perform IOC.  Infection status: Patient: Kimberly Castro Emergency General Surgery  Service Patient Case: Urgent Infection Present At Time Of Surgery (PATOS): Inflamed gallbladder   Description of Procedure:   On the date stated above, the patient was taken to the operating room suite and placed in supine positioning.  Sequential compression devices were placed on the lower extremities to prevent blood clots.  General endotracheal anesthesia was induced. Preoperative antibiotics were given.  The patient's abdomen was prepped and draped in the usual sterile fashion.  A time-out was completed verifying the correct patient, procedure, positioning and equipment needed for the case.  I entered the abdomen using optical technique from the right upper quadrant using a 5 mm trocar.  The abdomen was inflated to 15 mmHg.  I placed 3 additional trocars.  Once 12 mm trocar in the subxiphoid position, one 5 mm trocar in the right subcostal region and one 5 mm trocar above the umbilicus. There was no trauma to the underlying viscera with initial trocar placement.  Any abnormal findings, other than inflammation in the right upper quadrant, are listed above in the findings section.    The patient was then placed in head up, left side down positioning.  The gallbladder was identified and dissected free from its attachments to the omentum allowing the duodenum to fall away.  The liver was massive and the gallbladder was firmly inflamed with a thick wall and rind.  I attempted to dissect the infundibulum working laterally to medially, but the size of the liver and patient's body habitus did not allow for adequate retraction for me to feel comfortable identifying the structures with this technique so I performed a dome down dissection.  The dome of the gallbladder was able to be separated from the liver bed bluntly due  to the severe inflammation.  I worked from the dome of the gallbladder down to the infundibulum.  There was some bleeding from the liver bed which was first controlled with a Ray-Tec and then  controlled with electrocautery.  The liver bled pretty freely but was able to be controlled eventually, there was significant blood loss during this portion of the case.  As I got toward the infundibulum I placed an Endoloop around the gallbladder and secured it.  This provided some hemostasis and some initial control of the gallbladder.  This allowed me to spend some time obtaining hemostasis in the gallbladder fossa.  I then inspected the Endoloop.  It was clearly around the infundibulum and now with the gallbladder free from the liver bed I was able to better retract the infundibulum laterally and worked to dissect out the cystic duct and cystic artery.  These were identified and cleared of surrounding attachments.  The cystic duct was quite thin-walled and inflamed.  It did not seem significantly enlarged.  I felt attempting an intraoperative cholangiogram would be technically not feasible due to the size of the liver and the short cystic duct and the inflammation of the cystic duct may be concern for postoperative bile leak if I were to use part of it for a duct anatomy.  I decided to forego an intraoperative cholangiogram.  The cystic duct and artery's were clipped and divided and a PDS Endoloop was placed around the cystic duct stump.  The gallbladder was then removed through the subxiphoid port.  The clips were inspected and appeared effective.  The cystic plate was inspected and hemostasis was obtained using electrocautery.  A suction irrigator was used to clean the operative field.  Snow topical hemostatic was placed in the gallbladder fossa.  Attention was turned to closure.  The 12 mm subxiphoid port site was closed using two figure-of-eight a 0-vicryl suture on a fascial suture passer.  The abdomen was desufflated.  The skin was closed using 4-0 monocryl and dermabond.  All sponge and needle counts were correct at the conclusion of the case.    Deward Foy, MD General, Bariatric, & Minimally  Invasive Surgery [facility], [name]

## 2023-12-31 NOTE — Anesthesia Preprocedure Evaluation (Addendum)
 Anesthesia Evaluation  Patient identified by MRN, date of birth, ID band Patient awake    Reviewed: Allergy & Precautions, H&P , NPO status , Patient's Chart, lab work & pertinent test results, reviewed documented beta blocker date and time   History of Anesthesia Complications (+) history of anesthetic complications (hypotension w/ colonoscopy per pt)  Airway Mallampati: II  TM Distance: >3 FB Neck ROM: Full    Dental  (+) Teeth Intact, Dental Advisory Given   Pulmonary sleep apnea and Continuous Positive Airway Pressure Ventilation , former smoker   Pulmonary exam normal breath sounds clear to auscultation       Cardiovascular hypertension (134/71 preop), Pt. on medications and Pt. on home beta blockers pulmonary hypertensionNormal cardiovascular exam+ Valvular Problems/Murmurs (mild AS 2024) AS  Rhythm:Regular Rate:Normal  2020  Echo LVEF 61%  reported Gr II diastolic dysfunction   2020  Myoview  No ichemia 2024 Echo LVEF normal  Mild AS (mean gradient 14 mmHg)  Seen by cards yesterday for chronic dyspnea, although 4 METs- thought to be 2/2 habitus- repeat echo ordered still AS w/ mean gradient 7, some volume overload, RA pressures 15   Neuro/Psych  Headaches PSYCHIATRIC DISORDERS Anxiety Depression       GI/Hepatic Neg liver ROS,,,S/p gastric bypass   Endo/Other    Class 4 obesity (BMI 63)Super morbid obesity  Renal/GU negative Renal ROS  negative genitourinary   Musculoskeletal  (+) Arthritis , Osteoarthritis,  Fibromyalgia -  Abdominal  (+) + obese  Peds negative pediatric ROS (+)  Hematology  (+) Blood dyscrasia, anemia Hb 9.4, plt 215   Anesthesia Other Findings   Reproductive/Obstetrics negative OB ROS                              Anesthesia Physical Anesthesia Plan  ASA: 4  Anesthesia Plan: General   Post-op Pain Management: Tylenol  PO (pre-op)* and Ketamine  IV*    Induction: Intravenous  PONV Risk Score and Plan: 4 or greater and Ondansetron , Dexamethasone , Midazolam  and Treatment may vary due to age or medical condition  Airway Management Planned: Oral ETT and Video Laryngoscope Planned  Additional Equipment: None  Intra-op Plan:   Post-operative Plan: Extubation in OR  Informed Consent: I have reviewed the patients History and Physical, chart, labs and discussed the procedure including the risks, benefits and alternatives for the proposed anesthesia with the patient or authorized representative who has indicated his/her understanding and acceptance.     Dental advisory given  Plan Discussed with: CRNA  Anesthesia Plan Comments: (Quick read of repeat echo with RA pressures of 15- likely mod-severe pulmonary HTN 2/2 super morbid obesity. Close titration of etCO2)         Anesthesia Quick Evaluation

## 2023-12-31 NOTE — Plan of Care (Signed)

## 2023-12-31 NOTE — Discharge Instructions (Signed)

## 2023-12-31 NOTE — Anesthesia Procedure Notes (Signed)
 Procedure Name: Intubation Date/Time: 12/31/2023 10:40 AM  Performed by: Kathern Rollene LABOR, CRNAPre-anesthesia Checklist: Patient identified, Emergency Drugs available, Suction available and Patient being monitored Patient Re-evaluated:Patient Re-evaluated prior to induction Oxygen Delivery Method: Circle system utilized Preoxygenation: Pre-oxygenation with 100% oxygen Induction Type: IV induction Ventilation: Mask ventilation without difficulty Laryngoscope Size: Mac and 3 Grade View: Grade I Tube type: Oral Tube size: 7.0 mm Number of attempts: 1 Airway Equipment and Method: Stylet Placement Confirmation: ETT inserted through vocal cords under direct vision, positive ETCO2 and breath sounds checked- equal and bilateral Secured at: 21 cm Tube secured with: Tape Dental Injury: Teeth and Oropharynx as per pre-operative assessment

## 2023-12-31 NOTE — Progress Notes (Signed)
 PROGRESS NOTE    Kimberly Castro  FMW:[id number] DOB: [birth year] DOA: 12/28/2023 PCP: Corlis Pagan, NP   Brief Narrative:  63 y.o. female with medical history significant for fibromyalgia, ADD, depression, HTN, sleep apnea, IBS, CKD, HLD, chronic diastolic HF, morbid obesity, hx SBO and gastric bypass, and anxiety presented with abdominal pain, nausea and vomiting.  On presentation, WBC was 21.8, AST of 150, ALT of 228, bilirubin of 3, lipase of 261.  CT of abdomen and pelvis showed moderate gallbladder distention but no cholelithiasis.  MRCP showed acute cholecystitis and possible choledocholithiasis.  She was started on IV antibiotics.  General surgery was consulted who recommended GI consultation as well.  Cardiology was consulted as well.  Assessment & Plan:   Acute cholecystitis Gallstone pancreatitis Choledocholithiasis Elevated LFTs - Imaging as above.  Continue broad-spectrum antibiotics and IV fluids.  - LFTs improving with improved WBCs and no temperature spikes since admission. - GI and general surgery following.  Patient might have had passed the CBD stone.  General surgery planning for possible lap chole today. --Continue pain management and antiemetics as needed - Repeat a.m. LFTs  Chronic diastolic CHF  hypertension - Currently compensated.  Blood pressure currently stable.  Continue metoprolol .  Holding irbesartan  and spironolactone .  Strict input output.  Daily weights - Cardiology following: Okay to proceed with surgery as per cardiology  Leukocytosis - Improved  Fibromyalgia - Continue Lyrica   Anxiety and depression -Continue bupropion  and Effexor   Allergic rhinitis - Continue loratadine  and Flonase   History of migraines - Continue atogepant   OSA - Continue CPAP at bedtime  Morbid obesity class III - Outpatient follow-up  DVT prophylaxis: Lovenox  Code Status: Full Family Communication: Husband at bedside on 12/30/2023 Disposition Plan: Status  is: Inpatient Remains inpatient appropriate because: Of severity of illness    Consultants: General Surgery/GI/cardiology  Procedures: None  Antimicrobials: Zosyn  from 12/28/2023 onwards   Subjective: Patient seen and examined at bedside.  Denies chest pain, worsening shortness of breath, fever or vomiting.   Objective: Vitals:   12/30/23 2016 12/30/23 2136 12/30/23 2139 12/31/23 0411  BP: (!) 143/68 (!) 142/76 (!) 142/76 (!) 138/57  Pulse: 69 71 71 65  Resp: 18 18  20   Temp: 99.1 F (37.3 C)   98.5 F (36.9 C)  TempSrc: Oral   Oral  SpO2: 99% 95%  95%  Weight:      Height:        Intake/Output Summary (Last 24 hours) at 12/31/2023 0743 Last data filed at 12/31/2023 0622 Gross per 24 hour  Intake 3460.37 ml  Output 300 ml  Net 3160.37 ml   Filed Weights   12/28/23 0941 12/28/23 2037 12/30/23 0500  Weight: (!) 145.2 kg (!) 145.2 kg (!) 166.9 kg    Examination:  General: No acute distress.  Remains on room air.   ENT/neck: No JVD elevation or neck masses  respiratory: Bilateral decreased breath sounds at bases with scattered crackles CVS: Currently controlled; S1 and S2 are heard  abdominal: Soft, morbidly obese, mild right upper quadrant tenderness present, veins distended; no organomegaly, bowel sounds are heard normally Extremities: No clubbing; mild lower extremity edema present  CNS: Alert and oriented awake.  No focal neurologic deficit.  Able to move extremities Lymph: No obvious palpable lymphadenopathy Skin: No obvious petechia/rashes psych: Currently not agitated.  Affect is mostly flat  musculoskeletal: No obvious joint tenderness/erythema    Data Reviewed: I have personally reviewed following labs and imaging studies  CBC: Recent Labs  Lab 12/28/23 0947 12/29/23 0610 12/29/23 1525 12/30/23 0030 12/31/23 0316  WBC 21.8* 14.1*  --  12.4* 10.1  NEUTROABS 19.8*  --   --   --  7.1  HGB 11.8* 10.3* 10.0* 9.5* 9.4*  HCT 37.2 33.5* 32.4* 30.8* 30.2*   MCV 78.8* 81.9  --  80.8 80.7  PLT 344 261  --  218 215   Basic Metabolic Panel: Recent Labs  Lab 12/28/23 0947 12/29/23 0610 12/29/23 1525 12/30/23 0030 12/31/23 0316  NA 137 136 137 138 138  K 4.0 4.2 4.3 3.9 3.6  CL 99 103 105 106 106  CO2 24 25 24  21* 24  GLUCOSE 146* 98 93 94 98  BUN 20 27* 27* 23 19  CREATININE 1.06* 1.31* 1.30* 1.01* 0.84  CALCIUM  8.6* 7.2* 7.4* 7.4* 7.6*  MG  --   --   --   --  1.5*   GFR: Estimated Creatinine Clearance: 109.2 mL/min (by C-G formula based on SCr of 0.84 mg/dL). Liver Function Tests: Recent Labs  Lab 12/28/23 0947 12/29/23 0610 12/30/23 0030 12/31/23 0316  AST 150* 145* 101* 52*  ALT 228* 163* 125* 89*  ALKPHOS 384* 257* 229* 216*  BILITOT 3.0* 4.1* 2.8* 2.1*  PROT 7.7 6.6 6.1* 5.9*  ALBUMIN  4.0 3.0* 2.5* 2.3*   Recent Labs  Lab 12/28/23 0947 12/30/23 0030  LIPASE 261* 29   No results for input(s): AMMONIA in the last 168 hours. Coagulation Profile: Recent Labs  Lab 12/29/23 1525  INR 1.0   Cardiac Enzymes: No results for input(s): CKTOTAL, CKMB, CKMBINDEX, TROPONINI in the last 168 hours. BNP (last 3 results) No results for input(s): PROBNP in the last 8760 hours. HbA1C: No results for input(s): HGBA1C in the last 72 hours. CBG: No results for input(s): GLUCAP in the last 168 hours. Lipid Profile: No results for input(s): CHOL, HDL, LDLCALC, TRIG, CHOLHDL, LDLDIRECT in the last 72 hours. Thyroid Function Tests: No results for input(s): TSH, T4TOTAL, FREET4, T3FREE, THYROIDAB in the last 72 hours. Anemia Panel: No results for input(s): VITAMINB12, FOLATE, FERRITIN, TIBC, IRON, RETICCTPCT in the last 72 hours. Sepsis Labs: No results for input(s): PROCALCITON, LATICACIDVEN in the last 168 hours.  Recent Results (from the past 240 hours)  Culture, blood (single)     Status: Abnormal (Preliminary result)   Collection Time: 12/28/23  9:46 AM   Specimen:  BLOOD  Result Value Ref Range Status   Specimen Description   Final    BLOOD LEFT ANTECUBITAL Performed at [facility], [address] [place], [place] [address]    Special Requests   Final    BOTTLES DRAWN AEROBIC AND ANAEROBIC Blood Culture adequate volume Performed at [facility], [address] [place], [place] [address]    Culture  Setup Time   Final    GRAM POSITIVE COCCI ANAEROBIC BOTTLE ONLY CRITICAL RESULT CALLED TO, READ BACK BY AND VERIFIED WITH: PHARMD ABBY BETHENE [id number] AT 0836 BY EC    Culture (A)  Final    STREPTOCOCCUS GALLOLYTICUS SUSCEPTIBILITIES TO FOLLOW Performed at [facility], [address], [place], [place] [address]    Report Status PENDING  Incomplete  Blood Culture ID Panel (Reflexed)     Status: Abnormal   Collection Time: 12/28/23  9:46 AM  Result Value Ref Range Status   Enterococcus faecalis NOT DETECTED NOT DETECTED Final   Enterococcus Faecium NOT DETECTED NOT DETECTED Final   Listeria monocytogenes NOT DETECTED NOT DETECTED Final  Staphylococcus species NOT DETECTED NOT DETECTED Final   Staphylococcus aureus (BCID) NOT DETECTED NOT DETECTED Final   Staphylococcus epidermidis NOT DETECTED NOT DETECTED Final   Staphylococcus lugdunensis NOT DETECTED NOT DETECTED Final   Streptococcus species DETECTED (A) NOT DETECTED Final    Comment: Not Enterococcus species, Streptococcus agalactiae, Streptococcus pyogenes, or Streptococcus pneumoniae. CRITICAL RESULT CALLED TO, READ BACK BY AND VERIFIED WITH: PHARMD ABBY BETHENE [id number] AT 0836 BY EC    Streptococcus agalactiae NOT DETECTED NOT DETECTED Final   Streptococcus pneumoniae NOT DETECTED NOT DETECTED Final   Streptococcus pyogenes NOT DETECTED NOT DETECTED Final   A.calcoaceticus-baumannii NOT DETECTED NOT DETECTED Final   Bacteroides fragilis NOT DETECTED NOT DETECTED Final   Enterobacterales NOT DETECTED NOT DETECTED Final    Enterobacter cloacae complex NOT DETECTED NOT DETECTED Final   Escherichia coli NOT DETECTED NOT DETECTED Final   Klebsiella aerogenes NOT DETECTED NOT DETECTED Final   Klebsiella oxytoca NOT DETECTED NOT DETECTED Final   Klebsiella pneumoniae NOT DETECTED NOT DETECTED Final   Proteus species NOT DETECTED NOT DETECTED Final   Salmonella species NOT DETECTED NOT DETECTED Final   Serratia marcescens NOT DETECTED NOT DETECTED Final   Haemophilus influenzae NOT DETECTED NOT DETECTED Final   Neisseria meningitidis NOT DETECTED NOT DETECTED Final   Pseudomonas aeruginosa NOT DETECTED NOT DETECTED Final   Stenotrophomonas maltophilia NOT DETECTED NOT DETECTED Final   Candida albicans NOT DETECTED NOT DETECTED Final   Candida auris NOT DETECTED NOT DETECTED Final   Candida glabrata NOT DETECTED NOT DETECTED Final   Candida krusei NOT DETECTED NOT DETECTED Final   Candida parapsilosis NOT DETECTED NOT DETECTED Final   Candida tropicalis NOT DETECTED NOT DETECTED Final   Cryptococcus neoformans/gattii NOT DETECTED NOT DETECTED Final    Comment: Performed at [facility], [address], [place], [place] [address]         Radiology Studies: DG CHEST PORT 1 VIEW Result Date: 12/29/2023 CLINICAL DATA:  Cough.  History of pancreatitis. EXAM: PORTABLE CHEST 1 VIEW COMPARISON:  Abdominal CT scan 12/28/2018 FINDINGS: Borderline cardiac enlargement given the AP projection portable technique. Calcification of the thoracic aorta noted. Streaky bibasilar scarring or atelectasis but no infiltrates, effusions or pulmonary edema. No pneumothorax or pulmonary lesions. IMPRESSION: Streaky bibasilar scarring or atelectasis. Electronically Signed   By: MYRTIS Stammer M.D.   On: 12/29/2023 16:36        Scheduled Meds:  Atogepant   1 tablet Oral Daily   buPROPion   300 mg Oral Daily   cholecalciferol   5,000 Units Oral Daily   enoxaparin  (LOVENOX ) injection  40 mg Subcutaneous Q24H   fluticasone   1 spray  Each Nare Daily   loratadine   10 mg Oral Daily   metoprolol  tartrate  25 mg Oral BID   pregabalin   150 mg Oral BID   venlafaxine  XR  225 mg Oral Q breakfast   Continuous Infusions:  lactated ringers  75 mL/hr at 12/30/23 1920   piperacillin -tazobactam (ZOSYN )  IV 3.375 g (12/30/23 2324)          Sophie Mao, MD [facility] 12/31/2023, 7:43 AM

## 2023-12-31 NOTE — Transfer of Care (Signed)
 Immediate Anesthesia Transfer of Care Note  Patient: Kimberly Castro  Procedure(s) Performed: LAPAROSCOPIC CHOLECYSTECTOMY (Abdomen)  Patient Location: PACU  Anesthesia Type:General  Level of Consciousness: oriented, drowsy, and patient cooperative  Airway & Oxygen Therapy: Patient Spontanous Breathing and Patient connected to face mask oxygen  Post-op Assessment: Report given to RN and Post -op Vital signs reviewed and stable  Post vital signs: Reviewed and stable  Last Vitals:  Vitals Value Taken Time  BP 131/63 12/31/23 12:15  Temp 36.8 C 12/31/23 12:15  Pulse 79 12/31/23 12:17  Resp 19 12/31/23 12:17  SpO2 99 % 12/31/23 12:17  Vitals shown include unfiled device data.  Last Pain:  Vitals:   12/31/23 0849  TempSrc:   PainSc: 0-No pain      Patients Stated Pain Goal: 2 (12/31/23 0849)  Complications: No notable events documented.

## 2023-12-31 NOTE — Progress Notes (Signed)
   12/31/23 2041  BiPAP/CPAP/SIPAP  BiPAP/CPAP/SIPAP Pt Type Adult  BiPAP/CPAP/SIPAP Resmed  Mask Type Full face mask  Dentures removed? Not applicable  Mask Size Large  Respiratory Rate 16 breaths/min  FiO2 (%) 21 %  Patient Home Machine No  Patient Home Mask No  Patient Home Tubing No  Auto Titrate Yes  Minimum cmH2O 9 cmH2O  Maximum cmH2O 20 cmH2O  CPAP/SIPAP surface wiped down Yes  Device Plugged into RED Power Outlet Yes

## 2023-12-31 NOTE — TOC Initial Note (Signed)
 Transition of Care [facility]) - Initial/Assessment Note    Patient Details  Name: Kimberly Castro MRN: [id number] Date of Birth: [birth year]  Transition of Care [facility]) CM/SW Contact:    Sheri ONEIDA Sharps, LCSW Phone Number: 12/31/2023, 4:50 PM  Clinical Narrative:                 Pt from home w/ spouse. Pt continues medical workup. TOC following for dc needs.     Barriers to Discharge: Continued Medical Work up   Patient Goals and CMS Choice Patient states their goals for this hospitalization and ongoing recovery are:: return home          Expected Discharge Plan and Services In-house Referral: NA Discharge Planning Services: NA   Living arrangements for the past 2 months: Single Family Home                 DME Arranged: N/A DME Agency: NA       HH Arranged: NA HH Agency: NA        Prior Living Arrangements/Services Living arrangements for the past 2 months: Single Family Home Lives with:: Spouse Patient language and need for interpreter reviewed:: Yes Do you feel safe going back to the place where you live?: Yes      Need for Family Participation in Patient Care: Yes (Comment) Care giver support system in place?: Yes (comment)   Criminal Activity/Legal Involvement Pertinent to Current Situation/Hospitalization: No - Comment as needed  Activities of Daily Living   ADL Screening (condition at time of admission) Independently performs ADLs?: No Does the patient have a NEW difficulty with bathing/dressing/toileting/self-feeding that is expected to last >3 days?: No Does the patient have a NEW difficulty with getting in/out of bed, walking, or climbing stairs that is expected to last >3 days?: No Does the patient have a NEW difficulty with communication that is expected to last >3 days?: No Is the patient deaf or have difficulty hearing?: No Does the patient have difficulty seeing, even when wearing glasses/contacts?: No Does the patient have difficulty  concentrating, remembering, or making decisions?: No  Permission Sought/Granted                  Emotional Assessment Appearance:: Appears stated age Attitude/Demeanor/Rapport: Engaged Affect (typically observed): Accepting Orientation: : Oriented to Self, Oriented to Place, Oriented to  Time, Oriented to Situation Alcohol / Substance Use: Not Applicable Psych Involvement: No (comment)  Admission diagnosis:  Epigastric pain [R10.13] Gallstone pancreatitis [K85.10] Patient Active Problem List   Diagnosis Date Noted   Abnormal LFTs 12/30/2023   Choledocholithiasis 12/30/2023   History of Roux-en-Y gastric bypass 12/30/2023   Gallstone pancreatitis 12/28/2023   Calculus of gallbladder and bile duct with acute cholecystitis, with obstruction 12/28/2023   Choledocholithiasis with acute cholecystitis 12/28/2023   Statin intolerance 03/09/2023   Aortic atherosclerosis (HCC) 03/09/2023   Shortness of breath 03/09/2023   Anosmia 05/09/2022   Osteoarthritis of both knees 05/09/2022   Other hyperlipidemia 04/05/2022   Polyphagia 04/05/2022   Vitamin D  deficiency 04/05/2022   Insulin  resistance 02/08/2022   Fatigue 06/10/2020   Urinary incontinence 06/23/2019   Mixed urinary incontinence due to female genital prolapse 06/02/2019   Cystocele with rectocele 06/02/2019   Rectocele, female 06/02/2019   Urinary retention 06/02/2019   Other specified behavioral and emotional disorders with onset usually occurring in childhood and adolescence 03/30/2017   Anxiety 03/30/2017   Hair loss 03/30/2017   Hyperlipidemia 03/30/2017   Moderate episode of recurrent  major depressive disorder (HCC) 03/30/2017   Vitamin B 12 deficiency 03/30/2017   Encounter for screening colonoscopy 01/30/2017   Attention deficit hyperactivity disorder, predominantly inattentive type 01/02/2017   Cobalamin deficiency 01/02/2017   Insomnia 01/02/2017   Small bowel obstruction (HCC) 08/12/2016   History of  hysterectomy 05/09/2016   Essential hypertension 03/31/2016   Fibromyalgia 03/31/2016   Morbid obesity with BMI of 50.0-59.9, adult (HCC) 03/31/2016   Sleep apnea 03/31/2016   S/P gastric bypass 03/31/2016   Palpitations 03/31/2016   Serum creatinine raised 09/10/2014   PCP:  Corlis Pagan, NP Pharmacy:   [facility] [place], [place] - [address] [place] [address] Phone: [phone] Fax: [phone]     Social Drivers of Health (SDOH) Social History: SDOH Screenings   Food Insecurity: No Food Insecurity (12/28/2023)  Housing: Low Risk  (12/28/2023)  Transportation Needs: No Transportation Needs (12/28/2023)  Utilities: Not At Risk (12/28/2023)  Depression (PHQ2-9): Medium Risk (12/30/2020)  Financial Resource Strain: Low Risk  (11/28/2019)  Physical Activity: Inactive (11/28/2019)  Social Connections: Moderately Integrated (11/28/2019)  Stress: Stress Concern Present (11/28/2019)  Tobacco Use: Medium Risk (12/31/2023)   SDOH Interventions:     Readmission Risk Interventions    12/31/2023    4:40 PM  Readmission Risk Prevention Plan  Post Dischage Appt Complete  Medication Screening Complete  Transportation Screening Complete

## 2023-12-31 NOTE — Anesthesia Postprocedure Evaluation (Signed)
 Anesthesia Post Note  Patient: Kimberly Castro  Procedure(s) Performed: LAPAROSCOPIC CHOLECYSTECTOMY (Abdomen)     Patient location during evaluation: PACU Anesthesia Type: General Level of consciousness: awake and alert, oriented and patient cooperative Pain management: pain level controlled Vital Signs Assessment: post-procedure vital signs reviewed and stable Respiratory status: spontaneous breathing, nonlabored ventilation and respiratory function stable Cardiovascular status: blood pressure returned to baseline and stable Postop Assessment: no apparent nausea or vomiting Anesthetic complications: no   No notable events documented.  Last Vitals:  Vitals:   12/31/23 1245 12/31/23 1300  BP: (!) 154/72 (!) 148/80  Pulse: 74 69  Resp: 14 11  Temp:    SpO2: 99% 95%    Last Pain:  Vitals:   12/31/23 1300  TempSrc:   PainSc: Asleep                 Almarie CHRISTELLA Marchi

## 2023-12-31 NOTE — Progress Notes (Signed)
 Subjective: Patient seen and examined.  Chart reviewed.  She underwent a laparoscopic cholecystectomy to today for cholelithiasis complicated by gallstone pancreatitis.  I IOC was not done due to a short thin-walled inflamed cystic duct.  Patient claims she is doing well at this time and denies any nausea vomiting.  Her family is at bedside..  Objective: Vital signs in last 24 hours: Temp:  [98.1 F (36.7 C)-99.1 F (37.3 C)] 98.3 F (36.8 C) (07/14 1215) Pulse Rate:  [65-81] 69 (07/14 1300) Resp:  [11-25] 11 (07/14 1300) BP: (126-154)/(57-81) 148/80 (07/14 1300) SpO2:  [95 %-99 %] 95 % (07/14 1300) FiO2 (%):  [21 %] 21 % (07/13 2248) Weight:  [166.9 kg] 166.9 kg (07/14 0850) Last BM Date : 12/29/23  Intake/Output from previous day: 07/13 0701 - 07/14 0700 In: 3460.4 [P.O.:240; I.V.:3070.5; IV Piggyback:149.9] Out: 300 [Urine:300] Intake/Output this shift: Total I/O In: 1100 [I.V.:1100] Out: 400 [Blood:400]  General Physical exam reveals a morbidly obese white female in no acute distress chest clear to auscultation S1-S2 regular abdomen soft mild tenderness on palpation with no bowel sounds  Lab Results: Recent Labs    12/29/23 0610 12/29/23 1525 12/30/23 0030 12/31/23 0316  WBC 14.1*  --  12.4* 10.1  HGB 10.3* 10.0* 9.5* 9.4*  HCT 33.5* 32.4* 30.8* 30.2*  PLT 261  --  218 215   BMET Recent Labs    12/29/23 1525 12/30/23 0030 12/31/23 0316  NA 137 138 138  K 4.3 3.9 3.6  CL 105 106 106  CO2 24 21* 24  GLUCOSE 93 94 98  BUN 27* 23 19  CREATININE 1.30* 1.01* 0.84  CALCIUM  7.4* 7.4* 7.6*   LFT Recent Labs    12/31/23 0316  PROT 5.9*  ALBUMIN  2.3*  AST 52*  ALT 89*  ALKPHOS 216*  BILITOT 2.1*   PT/INR Recent Labs    12/29/23 1525  LABPROT 14.1  INR 1.0   Studies/Results: DG C-Arm 1-60 Min-No Report Result Date: 12/31/2023 Fluoroscopy was utilized by the requesting physician.  No radiographic interpretation.   ECHOCARDIOGRAM COMPLETE Result  Date: 12/31/2023    ECHOCARDIOGRAM REPORT   Patient Name:   Kimberly WAHLER Date of Exam: 12/31/2023 Medical Rec #:  [id number]           Height:       64.0 in Accession #:    [id number]          Weight:       367.9 lb Date of Birth:  06-Mar-1961           BSA:          2.535 m Patient Age:    63 years            BP:           134/71 mmHg Patient Gender: F                   HR:           63 bpm. Exam Location:  Inpatient Procedure: 2D Echo, Cardiac Doppler and Color Doppler (Both Spectral and Color            Flow Doppler were utilized during procedure). Indications:    Pre-op evaluation  History:        Patient has prior history of Echocardiogram examinations, most                 recent 04/02/2019. Risk Factors:Hypertension, Dyslipidemia and  Morbid obesity.  Referring Phys: [id number] ANGELA NICOLE DUKE IMPRESSIONS  1. Left ventricular ejection fraction, by estimation, is 55 to 60%. The left ventricle has normal function. The left ventricle has no regional wall motion abnormalities. There is mild concentric left ventricular hypertrophy. Left ventricular diastolic parameters are consistent with Grade I diastolic dysfunction (impaired relaxation). Elevated left ventricular end-diastolic pressure.  2. Right ventricular systolic function is mildly reduced. The right ventricular size is normal. There is moderately elevated pulmonary artery systolic pressure.  3. Left atrial size was severely dilated.  4. The mitral valve is normal in structure. Trivial mitral valve regurgitation. No evidence of mitral stenosis.  5. The aortic valve is tricuspid. There is mild calcification of the aortic valve. There is mild thickening of the aortic valve. Aortic valve regurgitation is trivial. Aortic valve sclerosis/calcification is present, without any evidence of aortic stenosis. Aortic valve area, by VTI measures 2.23 cm. Aortic valve mean gradient measures 7.0 mmHg. Aortic valve Vmax measures 1.87 m/s.  6. The  inferior vena cava is dilated in size with <50% respiratory variability, suggesting right atrial pressure of 15 mmHg. FINDINGS  Left Ventricle: Left ventricular ejection fraction, by estimation, is 55 to 60%. The left ventricle has normal function. The left ventricle has no regional wall motion abnormalities. The left ventricular internal cavity size was normal in size. There is  mild concentric left ventricular hypertrophy. Left ventricular diastolic parameters are consistent with Grade I diastolic dysfunction (impaired relaxation). Elevated left ventricular end-diastolic pressure. Right Ventricle: The right ventricular size is normal. No increase in right ventricular wall thickness. Right ventricular systolic function is mildly reduced. There is moderately elevated pulmonary artery systolic pressure. The tricuspid regurgitant velocity is 3.26 m/s, and with an assumed right atrial pressure of 15 mmHg, the estimated right ventricular systolic pressure is 57.5 mmHg. Left Atrium: Left atrial size was severely dilated. Right Atrium: Right atrial size was normal in size. Pericardium: There is no evidence of pericardial effusion. Mitral Valve: The mitral valve is normal in structure. Trivial mitral valve regurgitation. No evidence of mitral valve stenosis. Tricuspid Valve: The tricuspid valve is normal in structure. Tricuspid valve regurgitation is mild . No evidence of tricuspid stenosis. Aortic Valve: The aortic valve is tricuspid. There is mild calcification of the aortic valve. There is mild thickening of the aortic valve. Aortic valve regurgitation is trivial. Aortic valve sclerosis/calcification is present, without any evidence of aortic stenosis. Aortic valve mean gradient measures 7.0 mmHg. Aortic valve peak gradient measures 14.0 mmHg. Aortic valve area, by VTI measures 2.23 cm. Pulmonic Valve: The pulmonic valve was normal in structure. Pulmonic valve regurgitation is not visualized. No evidence of pulmonic  stenosis. Aorta: The aortic root is normal in size and structure. Venous: The inferior vena cava is dilated in size with less than 50% respiratory variability, suggesting right atrial pressure of 15 mmHg. IAS/Shunts: No atrial level shunt detected by color flow Doppler.  LEFT VENTRICLE PLAX 2D LVIDd:         5.00 cm      Diastology LVIDs:         3.40 cm      LV e' medial:    5.66 cm/s LV PW:         1.40 cm      LV E/e' medial:  15.1 LV IVS:        1.10 cm      LV e' lateral:   5.00 cm/s LVOT diam:     2.20  cm      LV E/e' lateral: 17.1 LV SV:         101 LV SV Index:   40 LVOT Area:     3.80 cm  LV Volumes (MOD) LV vol d, MOD A2C: 109.0 ml LV vol d, MOD A4C: 122.0 ml LV vol s, MOD A2C: 41.5 ml LV vol s, MOD A4C: 49.5 ml LV SV MOD A2C:     67.5 ml LV SV MOD A4C:     122.0 ml LV SV MOD BP:      72.2 ml RIGHT VENTRICLE            IVC RV Basal diam:  3.90 cm    IVC diam: 2.60 cm RV S prime:     8.05 cm/s TAPSE (M-mode): 3.0 cm LEFT ATRIUM             Index LA diam:        4.80 cm 1.89 cm/m LA Vol (A2C):   73.4 ml 28.95 ml/m LA Vol (A4C):   99.2 ml 39.13 ml/m LA Biplane Vol: 94.1 ml 37.12 ml/m  AORTIC VALVE AV Area (Vmax):    2.15 cm AV Area (Vmean):   2.15 cm AV Area (VTI):     2.23 cm AV Vmax:           187.00 cm/s AV Vmean:          125.000 cm/s AV VTI:            0.452 m AV Peak Grad:      14.0 mmHg AV Mean Grad:      7.0 mmHg LVOT Vmax:         106.00 cm/s LVOT Vmean:        70.700 cm/s LVOT VTI:          0.265 m LVOT/AV VTI ratio: 0.59  AORTA Ao Root diam: 3.10 cm Ao Asc diam:  3.70 cm MITRAL VALVE                TRICUSPID VALVE MV Area (PHT): 3.20 cm     TR Peak grad:   42.5 mmHg MV Decel Time: 237 msec     TR Vmax:        326.00 cm/s MV E velocity: 85.30 cm/s MV A velocity: 103.00 cm/s  SHUNTS MV E/A ratio:  0.83         Systemic VTI:  0.26 m                             Systemic Diam: 2.20 cm Kimberly Scarce MD Electronically signed by Kimberly Scarce MD Signature Date/Time: 12/31/2023/10:02:15 AM     Final     Medications: I have reviewed the patient's current medications. Prior to Admission:  Medications Prior to Admission  Medication Sig Dispense Refill Last Dose/Taking   amphetamine -dextroamphetamine  (ADDERALL XR) 30 MG 24 hr capsule Take 1 capsule (30 mg total) by mouth in the morning. (01-21-22) 30 capsule 0 Past Week   buPROPion  (WELLBUTRIN  XL) 300 MG 24 hr tablet TAKE 1 TABLET(300 MG) BY MOUTH DAILY 90 tablet 1 Past Week   cetirizine (ZYRTEC) 10 MG tablet Take 10 mg by mouth daily.   Past Week   Cholecalciferol  125 MCG (5000 UT) capsule Take 5,000 Units by mouth daily.   Past Week   cyanocobalamin  (VITAMIN B12) 1000 MCG/ML injection Inject 1 mL (1,000 mcg total) into the muscle every 30 (thirty) days.  1 mL 6 11/22/2023   denosumab (PROLIA) 60 MG/ML SOSY injection Inject 60 mg into the skin every 6 (six) months.   Unknown   fluticasone  (FLONASE ) 50 MCG/ACT nasal spray Place 1 spray into both nostrils daily.   1 Past Week   LYRICA  150 MG capsule Take 150 mg by mouth 2 (two) times daily.   Past Week   metoprolol  tartrate (LOPRESSOR ) 25 MG tablet Take 1 tablet (25 mg total) by mouth 2 (two) times daily. 180 tablet 3 Past Week   QULIPTA  60 MG TABS TAKE 1 TABLET BY MOUTH EVERY DAY 30 tablet 6 Past Week   REPATHA  SURECLICK 140 MG/ML SOAJ Inject 140 mg into the skin every 14 (fourteen) days. 2 mL 6 12/23/2023   spironolactone  (ALDACTONE ) 25 MG tablet Take 0.5 tablets (12.5 mg total) by mouth daily. 45 tablet 3 Past Week   valsartan  (DIOVAN ) 160 MG tablet Take 1 tablet (160 mg total) by mouth daily. 90 tablet 3 Past Week   Venlafaxine  HCl 225 MG TB24 Take 225 mg by mouth daily with breakfast.   1 Past Week   zolpidem  (AMBIEN ) 10 MG tablet Take 10 mg by mouth at bedtime as needed for sleep.   0 Past Week   Scheduled:  acetaminophen   1,000 mg Oral Q6H   Atogepant   1 tablet Oral Daily   buPROPion   300 mg Oral Daily   cholecalciferol   5,000 Units Oral Daily   enoxaparin  (LOVENOX ) injection  40 mg  Subcutaneous Q24H   fluticasone   1 spray Each Nare Daily   loratadine   10 mg Oral Daily   metoprolol  tartrate  25 mg Oral BID   pregabalin   150 mg Oral BID   venlafaxine  XR  225 mg Oral Q breakfast   Continuous:  ampicillin -sulbactam (UNASYN ) IV 200 mL/hr at 12/31/23 1553    Assessment/Plan: 1) Cholelithiasis, elevated liver enzymes with gallstone pancreatitis-status post laparoscopic cholecystectomy with no IOC done today as mentioned above. Abnormal MRI showing possibility of a distal CBD stone. Agree with plans for follow-up on her LFTs as she does not have any ductal dilatation the MRI.  If her LFTs do not improve she will require referral to a tertiary center for an ERCP there.  I have discussed this with the patient and her family at her bedside. 2) Chronic diastolic CHF 3) Anxiety and depression 4) Morbid obesity. 5) Fibromyalgia. 6) Allergic rhinitis.  LOS: 3 days   Renaye Sous 12/31/2023, 1:33 PM

## 2023-12-31 NOTE — Progress Notes (Signed)
 Progress Note: General Surgery Service   Chief Complaint/Subjective: Feeling better this morning  Objective: Vital signs in last 24 hours: Temp:  [97.4 F (36.3 C)-99.1 F (37.3 C)] 98.5 F (36.9 C) (07/14 0411) Pulse Rate:  [65-79] 65 (07/14 0411) Resp:  [18-20] 20 (07/14 0411) BP: (138-156)/(57-77) 138/57 (07/14 0411) SpO2:  [95 %-99 %] 95 % (07/14 0411) FiO2 (%):  [21 %] 21 % (07/13 2248) Last BM Date : 12/29/23  Intake/Output from previous day: 07/13 0701 - 07/14 0700 In: 3460.4 [P.O.:240; I.V.:3070.5; IV Piggyback:149.9] Out: 300 [Urine:300] Intake/Output this shift: No intake/output data recorded.  Constitutional: NAD; conversant; no deformities Eyes: Moist conjunctiva; no lid lag; anicteric; PERRL Neck: Trachea midline; no thyromegaly Lungs: Normal respiratory effort; no tactile fremitus CV: RRR; no palpable thrills; no pitting edema GI: Abd soft, nontender; no palpable hepatosplenomegaly MSK: Normal range of motion of extremities; no clubbing/cyanosis Psychiatric: Appropriate affect; alert and oriented x3 Lymphatic: No palpable cervical or axillary lymphadenopathy  Lab Results: CBC  Recent Labs    12/30/23 0030 12/31/23 0316  WBC 12.4* 10.1  HGB 9.5* 9.4*  HCT 30.8* 30.2*  PLT 218 215   BMET Recent Labs    12/30/23 0030 12/31/23 0316  NA 138 138  K 3.9 3.6  CL 106 106  CO2 21* 24  GLUCOSE 94 98  BUN 23 19  CREATININE 1.01* 0.84  CALCIUM  7.4* 7.6*   PT/INR Recent Labs    12/29/23 1525  LABPROT 14.1  INR 1.0   ABG No results for input(s): PHART, HCO3 in the last 72 hours.  Invalid input(s): PCO2, PO2  Anti-infectives: Anti-infectives (From admission, onward)    Start     Dose/Rate Route Frequency Ordered Stop   12/28/23 2345  piperacillin -tazobactam (ZOSYN ) IVPB 3.375 g        3.375 g 12.5 mL/hr over 240 Minutes Intravenous Every 8 hours 12/28/23 2251     12/28/23 1045  piperacillin -tazobactam (ZOSYN ) IVPB 3.375 g         3.375 g 100 mL/hr over 30 Minutes Intravenous  Once 12/28/23 1040 12/28/23 1146       Medications: Scheduled Meds:  Atogepant   1 tablet Oral Daily   buPROPion   300 mg Oral Daily   cholecalciferol   5,000 Units Oral Daily   enoxaparin  (LOVENOX ) injection  40 mg Subcutaneous Q24H   fluticasone   1 spray Each Nare Daily   loratadine   10 mg Oral Daily   metoprolol  tartrate  25 mg Oral BID   pregabalin   150 mg Oral BID   venlafaxine  XR  225 mg Oral Q breakfast   Continuous Infusions:  lactated ringers  75 mL/hr at 12/30/23 1920   piperacillin -tazobactam (ZOSYN )  IV 3.375 g (12/31/23 0754)   PRN Meds:.acetaminophen  **OR** acetaminophen , diclofenac  Sodium, HYDROmorphone  (DILAUDID ) injection, ondansetron  **OR** ondansetron  (ZOFRAN ) IV, senna-docusate, sodium chloride  flush, zolpidem   Assessment/Plan: [name] is a 63 year old female with a history of gastric bypass and gallstone pancreatitis.  Her pancreatitis has resolved clinically.  Her liver enzymes are improving.  I recommend proceeding with laparoscopic cholecystectomy with intraoperative cholangiogram.  We discussed the procedure, its risks, benefits and alternatives and the patient granted consent to proceed.  Risks discussed included but were not limited to the risk of infection, bleeding, damage to nearby structures, bile leak, bile duct injury.  We will proceed this morning as OR time allows.   LOS: 3 days   Deward JINNY Foy, MD  [facility] Use [email or web address] to contact on  call provider  Daily Billing: [phone] - High MDM

## 2024-01-01 ENCOUNTER — Other Ambulatory Visit (HOSPITAL_COMMUNITY): Payer: Self-pay

## 2024-01-01 ENCOUNTER — Encounter (HOSPITAL_COMMUNITY): Payer: Self-pay | Admitting: Surgery

## 2024-01-01 DIAGNOSIS — K851 Biliary acute pancreatitis without necrosis or infection: Secondary | ICD-10-CM | POA: Diagnosis not present

## 2024-01-01 LAB — CBC WITH DIFFERENTIAL/PLATELET
Abs Immature Granulocytes: 0.09 K/uL — ABNORMAL HIGH (ref 0.00–0.07)
Basophils Absolute: 0 K/uL (ref 0.0–0.1)
Basophils Relative: 0 %
Eosinophils Absolute: 0.1 K/uL (ref 0.0–0.5)
Eosinophils Relative: 1 %
HCT: 29.6 % — ABNORMAL LOW (ref 36.0–46.0)
Hemoglobin: 9.1 g/dL — ABNORMAL LOW (ref 12.0–15.0)
Immature Granulocytes: 1 %
Lymphocytes Relative: 15 %
Lymphs Abs: 1.7 K/uL (ref 0.7–4.0)
MCH: 25.1 pg — ABNORMAL LOW (ref 26.0–34.0)
MCHC: 30.7 g/dL (ref 30.0–36.0)
MCV: 81.8 fL (ref 80.0–100.0)
Monocytes Absolute: 1.1 K/uL — ABNORMAL HIGH (ref 0.1–1.0)
Monocytes Relative: 10 %
Neutro Abs: 8.2 K/uL — ABNORMAL HIGH (ref 1.7–7.7)
Neutrophils Relative %: 73 %
Platelets: 260 K/uL (ref 150–400)
RBC: 3.62 MIL/uL — ABNORMAL LOW (ref 3.87–5.11)
RDW: 20 % — ABNORMAL HIGH (ref 11.5–15.5)
WBC: 11.1 K/uL — ABNORMAL HIGH (ref 4.0–10.5)
nRBC: 0 % (ref 0.0–0.2)

## 2024-01-01 LAB — COMPREHENSIVE METABOLIC PANEL WITH GFR
ALT: 83 U/L — ABNORMAL HIGH (ref 0–44)
AST: 59 U/L — ABNORMAL HIGH (ref 15–41)
Albumin: 2.4 g/dL — ABNORMAL LOW (ref 3.5–5.0)
Alkaline Phosphatase: 202 U/L — ABNORMAL HIGH (ref 38–126)
Anion gap: 7 (ref 5–15)
BUN: 19 mg/dL (ref 8–23)
CO2: 25 mmol/L (ref 22–32)
Calcium: 7.5 mg/dL — ABNORMAL LOW (ref 8.9–10.3)
Chloride: 105 mmol/L (ref 98–111)
Creatinine, Ser: 0.85 mg/dL (ref 0.44–1.00)
GFR, Estimated: >60 mL/min (ref >60)
Glucose, Bld: 97 mg/dL (ref 70–99)
Potassium: 4.2 mmol/L (ref 3.5–5.1)
Sodium: 137 mmol/L (ref 135–145)
Total Bilirubin: 1.5 mg/dL — ABNORMAL HIGH (ref 0.0–1.2)
Total Protein: 6.4 g/dL — ABNORMAL LOW (ref 6.5–8.1)

## 2024-01-01 LAB — SURGICAL PATHOLOGY

## 2024-01-01 LAB — MAGNESIUM: Magnesium: 1.6 mg/dL — ABNORMAL LOW (ref 1.7–2.4)

## 2024-01-01 MED ORDER — AMOXICILLIN-POT CLAVULANATE 875-125 MG PO TABS: 1.0000 | ORAL_TABLET | Freq: Two times a day (BID) | ORAL | 0 refills | Status: AC

## 2024-01-01 MED ORDER — MAGNESIUM SULFATE 2 GM/50ML IV SOLN
2.0000 g | Freq: Once | INTRAVENOUS | Status: AC
  Administered 2024-01-01: 2 g via INTRAVENOUS
  Filled 2024-01-01: qty 50

## 2024-01-01 MED ORDER — SPIRONOLACTONE 12.5 MG HALF TABLET
12.5000 mg | ORAL_TABLET | Freq: Every day | ORAL | Status: DC
  Administered 2024-01-01: 12.5 mg via ORAL
  Filled 2024-01-01: qty 1

## 2024-01-01 MED ORDER — ONDANSETRON HCL 4 MG PO TABS: 4.0000 mg | ORAL_TABLET | Freq: Four times a day (QID) | ORAL | 0 refills | Status: DC | PRN

## 2024-01-01 MED ORDER — ACETAMINOPHEN 500 MG PO TABS: 500.0000 mg | ORAL_TABLET | Freq: Four times a day (QID) | ORAL | Status: AC | PRN

## 2024-01-01 MED ORDER — OXYCODONE HCL 5 MG PO TABS
5.0000 mg | ORAL_TABLET | Freq: Four times a day (QID) | ORAL | 0 refills | Status: DC | PRN
  Filled 2024-01-01: qty 15, 4d supply, fill #0

## 2024-01-01 NOTE — Progress Notes (Signed)
  Progress Note  Patient Name: Kimberly Castro Date of Encounter: 01/01/2024 [facility] Cardiologist: Annabella Scarce, MD    Interval Summary   Patient reports feeling well this AM. No chest pain or shortness of breat. Has chronic lower extremity swelling. Surgery went well and she is tolerating clear liquid diet well   Vital Signs Vitals:   12/31/23 1948 12/31/23 2108 01/01/24 0500 01/01/24 0543  BP: (!) 120/39 125/70  (!) 113/54  Pulse: 60 (!) 58  64  Resp: 18 18  18   Temp: 97.9 F (36.6 C) 97.9 F (36.6 C)  97.9 F (36.6 C)  TempSrc: Oral Oral  Oral  SpO2: 97% 96%  97%  Weight:   (!) 164.9 kg   Height:        Intake/Output Summary (Last 24 hours) at 01/01/2024 0742 Last data filed at 12/31/2023 1553 Gross per 24 hour  Intake 1223.87 ml  Output 400 ml  Net 823.87 ml      01/01/2024    5:00 AM 12/31/2023    8:50 AM 12/30/2023    5:00 AM  Last 3 Weights  Weight (lbs) 363 lb 8.6 oz 367 lb 15.2 oz 367 lb 15.2 oz  Weight (kg) 164.9 kg 166.9 kg 166.9 kg      Telemetry/ECG  NSR - Personally Reviewed  Physical Exam  GEN: No acute distress.  Sitting upright in the bed in no acute distress  Neck: No JVD Cardiac:  RRR, no murmurs, rubs, or gallops.  Respiratory: Anterior lung exam clear. Normal WOB on room air  GI: Soft, nontender, non-distended  MS: 1+ edema in BLE   Assessment & Plan   CAD  - Previous CT scan in 2018 showed calcification of the LAD - Nuclear stress test in 05/2019 was a normal, low risk study without evidence of ischemia  - Echo this admission with EF 55-60%, no regional wall motion abnormalities, mild LVH, grade I DD, no significant valvular abnormalities  - Patient denies chest pain. Able to complete >4 METS physical activity so was cleared to proceed with lap chole yesterday. Tolerated surgery well  - Continue repatha    Chronic dyspnea  OSA  - Echo this admission with EF 55-60%, mildly reduced RV systolic function, elevated  LVEDP, moderately elevated PA systolic pressure  - Patient reports compliance with CPAP  - Resume home spironolactone  12.5 mg daily   HTN  - Continue metoprolol  tartrate 12 mg BID  - Resume home spironolactone  12.5 mg daily as above   HLD  - Lipid panel from 08/2023 showed LDL 98 - Continue repatha   - Considering starting nexlizet  as an outpatient    For questions or updates, please contact [facility] Please consult [email or web address] for contact info under       Signed, Rollo FABIENE Louder, PA-C

## 2024-01-01 NOTE — Progress Notes (Signed)
 1 Day Post-Op  Subjective: CC: Patient is doing well overall. Tolerating diet without nausea or emesis, though she expressed concerns about the texture of the food. Endorses having BM and passing flatus, Rates her pain at 6-7/10 in RUQ with tenderness around incision sites. Patient expressed that she would like to be discharged.   Objective: Vital signs in last 24 hours: Temp:  [97.5 F (36.4 C)-98.3 F (36.8 C)] 97.9 F (36.6 C) (07/15 0543) Pulse Rate:  [58-81] 64 (07/15 0543) Resp:  [11-25] 18 (07/15 0543) BP: (113-158)/(39-81) 113/54 (07/15 0543) SpO2:  [95 %-100 %] 97 % (07/15 0543) FiO2 (%):  [21 %] 21 % (07/14 2041) Weight:  [164.9 kg] 164.9 kg (07/15 0500) Last BM Date : 12/20/23  Intake/Output from previous day: 07/14 0701 - 07/15 0700 In: 1223.9 [I.V.:1100; IV Piggyback:123.9] Out: 400 [Blood:400] Intake/Output this shift: No intake/output data recorded.  PE: Physical Exam Constitutional:      General: She is not in acute distress.    Appearance: She is obese. She is not toxic-appearing.  Pulmonary:     Effort: Pulmonary effort is normal.  Abdominal:     General: Abdomen is protuberant. Bowel sounds are normal. There is no distension.     Palpations: Abdomen is soft.     Tenderness: There is abdominal tenderness in the right upper quadrant and epigastric area. There is no guarding or rebound.     Comments: Moderate TTP around incision sites. Incisions C/D/I without dehiscence or cellulitis.   Skin:    General: Skin is warm and dry.  Neurological:     General: No focal deficit present.     Mental Status: She is alert and oriented to person, place, and time.    Lab Results:  Recent Labs    12/31/23 0316 01/01/24 0602  WBC 10.1 11.1*  HGB 9.4* 9.1*  HCT 30.2* 29.6*  PLT 215 260   BMET Recent Labs    12/31/23 0316 01/01/24 0602  NA 138 137  K 3.6 4.2  CL 106 105  CO2 24 25  GLUCOSE 98 97  BUN 19 19  CREATININE 0.84 0.85  CALCIUM  7.6* 7.5*    PT/INR Recent Labs    12/29/23 1525  LABPROT 14.1  INR 1.0   CMP     Component Value Date/Time   NA 137 01/01/2024 0602   NA 142 09/11/2023 0832   K 4.2 01/01/2024 0602   CL 105 01/01/2024 0602   CO2 25 01/01/2024 0602   GLUCOSE 97 01/01/2024 0602   BUN 19 01/01/2024 0602   BUN 20 09/11/2023 0832   CREATININE 0.85 01/01/2024 0602   CALCIUM  7.5 (L) 01/01/2024 0602   PROT 6.4 (L) 01/01/2024 0602   PROT 6.8 09/11/2023 0832   ALBUMIN  2.4 (L) 01/01/2024 0602   ALBUMIN  4.2 09/11/2023 0832   AST 59 (H) 01/01/2024 0602   ALT 83 (H) 01/01/2024 0602   ALKPHOS 202 (H) 01/01/2024 0602   BILITOT 1.5 (H) 01/01/2024 0602   BILITOT 0.2 09/11/2023 0832   GFRNONAA >60 01/01/2024 0602   GFRAA 49 (L) 06/24/2019 1231   Lipase     Component Value Date/Time   LIPASE 29 12/30/2023 0030    Studies/Results: DG C-Arm 1-60 Min-No Report Result Date: 12/31/2023 Fluoroscopy was utilized by the requesting physician.  No radiographic interpretation.   ECHOCARDIOGRAM COMPLETE Result Date: 12/31/2023    ECHOCARDIOGRAM REPORT   Patient Name:   Kimberly Castro Date of Exam: 12/31/2023 Medical Rec #:  [id number]           Height:       64.0 in Accession #:    [id number]          Weight:       367.9 lb Date of Birth:  April 14, 1961           BSA:          2.535 m Patient Age:    63 years            BP:           134/71 mmHg Patient Gender: F                   HR:           63 bpm. Exam Location:  Inpatient Procedure: 2D Echo, Cardiac Doppler and Color Doppler (Both Spectral and Color            Flow Doppler were utilized during procedure). Indications:    Pre-op evaluation  History:        Patient has prior history of Echocardiogram examinations, most                 recent 04/02/2019. Risk Factors:Hypertension, Dyslipidemia and                 Morbid obesity.  Referring Phys: [id number] ANGELA NICOLE DUKE IMPRESSIONS  1. Left ventricular ejection fraction, by estimation, is 55 to 60%. The left ventricle has  normal function. The left ventricle has no regional wall motion abnormalities. There is mild concentric left ventricular hypertrophy. Left ventricular diastolic parameters are consistent with Grade I diastolic dysfunction (impaired relaxation). Elevated left ventricular end-diastolic pressure.  2. Right ventricular systolic function is mildly reduced. The right ventricular size is normal. There is moderately elevated pulmonary artery systolic pressure.  3. Left atrial size was severely dilated.  4. The mitral valve is normal in structure. Trivial mitral valve regurgitation. No evidence of mitral stenosis.  5. The aortic valve is tricuspid. There is mild calcification of the aortic valve. There is mild thickening of the aortic valve. Aortic valve regurgitation is trivial. Aortic valve sclerosis/calcification is present, without any evidence of aortic stenosis. Aortic valve area, by VTI measures 2.23 cm. Aortic valve mean gradient measures 7.0 mmHg. Aortic valve Vmax measures 1.87 m/s.  6. The inferior vena cava is dilated in size with <50% respiratory variability, suggesting right atrial pressure of 15 mmHg. FINDINGS  Left Ventricle: Left ventricular ejection fraction, by estimation, is 55 to 60%. The left ventricle has normal function. The left ventricle has no regional wall motion abnormalities. The left ventricular internal cavity size was normal in size. There is  mild concentric left ventricular hypertrophy. Left ventricular diastolic parameters are consistent with Grade I diastolic dysfunction (impaired relaxation). Elevated left ventricular end-diastolic pressure. Right Ventricle: The right ventricular size is normal. No increase in right ventricular wall thickness. Right ventricular systolic function is mildly reduced. There is moderately elevated pulmonary artery systolic pressure. The tricuspid regurgitant velocity is 3.26 m/s, and with an assumed right atrial pressure of 15 mmHg, the estimated right  ventricular systolic pressure is 57.5 mmHg. Left Atrium: Left atrial size was severely dilated. Right Atrium: Right atrial size was normal in size. Pericardium: There is no evidence of pericardial effusion. Mitral Valve: The mitral valve is normal in structure. Trivial mitral valve regurgitation. No evidence of mitral valve stenosis. Tricuspid Valve: The tricuspid valve is normal in structure. Tricuspid valve  regurgitation is mild . No evidence of tricuspid stenosis. Aortic Valve: The aortic valve is tricuspid. There is mild calcification of the aortic valve. There is mild thickening of the aortic valve. Aortic valve regurgitation is trivial. Aortic valve sclerosis/calcification is present, without any evidence of aortic stenosis. Aortic valve mean gradient measures 7.0 mmHg. Aortic valve peak gradient measures 14.0 mmHg. Aortic valve area, by VTI measures 2.23 cm. Pulmonic Valve: The pulmonic valve was normal in structure. Pulmonic valve regurgitation is not visualized. No evidence of pulmonic stenosis. Aorta: The aortic root is normal in size and structure. Venous: The inferior vena cava is dilated in size with less than 50% respiratory variability, suggesting right atrial pressure of 15 mmHg. IAS/Shunts: No atrial level shunt detected by color flow Doppler.  LEFT VENTRICLE PLAX 2D LVIDd:         5.00 cm      Diastology LVIDs:         3.40 cm      LV e' medial:    5.66 cm/s LV PW:         1.40 cm      LV E/e' medial:  15.1 LV IVS:        1.10 cm      LV e' lateral:   5.00 cm/s LVOT diam:     2.20 cm      LV E/e' lateral: 17.1 LV SV:         101 LV SV Index:   40 LVOT Area:     3.80 cm  LV Volumes (MOD) LV vol d, MOD A2C: 109.0 ml LV vol d, MOD A4C: 122.0 ml LV vol s, MOD A2C: 41.5 ml LV vol s, MOD A4C: 49.5 ml LV SV MOD A2C:     67.5 ml LV SV MOD A4C:     122.0 ml LV SV MOD BP:      72.2 ml RIGHT VENTRICLE            IVC RV Basal diam:  3.90 cm    IVC diam: 2.60 cm RV S prime:     8.05 cm/s TAPSE (M-mode): 3.0  cm LEFT ATRIUM             Index LA diam:        4.80 cm 1.89 cm/m LA Vol (A2C):   73.4 ml 28.95 ml/m LA Vol (A4C):   99.2 ml 39.13 ml/m LA Biplane Vol: 94.1 ml 37.12 ml/m  AORTIC VALVE AV Area (Vmax):    2.15 cm AV Area (Vmean):   2.15 cm AV Area (VTI):     2.23 cm AV Vmax:           187.00 cm/s AV Vmean:          125.000 cm/s AV VTI:            0.452 m AV Peak Grad:      14.0 mmHg AV Mean Grad:      7.0 mmHg LVOT Vmax:         106.00 cm/s LVOT Vmean:        70.700 cm/s LVOT VTI:          0.265 m LVOT/AV VTI ratio: 0.59  AORTA Ao Root diam: 3.10 cm Ao Asc diam:  3.70 cm MITRAL VALVE                TRICUSPID VALVE MV Area (PHT): 3.20 cm     TR Peak grad:   42.5 mmHg MV  Decel Time: 237 msec     TR Vmax:        326.00 cm/s MV E velocity: 85.30 cm/s MV A velocity: 103.00 cm/s  SHUNTS MV E/A ratio:  0.83         Systemic VTI:  0.26 m                             Systemic Diam: 2.20 cm Annabella Scarce MD Electronically signed by Annabella Scarce MD Signature Date/Time: 12/31/2023/10:02:15 AM    Final     Anti-infectives: Anti-infectives (From admission, onward)    Start     Dose/Rate Route Frequency Ordered Stop   12/31/23 1600  Ampicillin -Sulbactam (UNASYN ) 3 g in sodium chloride  0.9 % 100 mL IVPB        3 g 200 mL/hr over 30 Minutes Intravenous Every 6 hours 12/31/23 1407 01/06/24 1559   12/28/23 2345  piperacillin -tazobactam (ZOSYN ) IVPB 3.375 g  Status:  Discontinued        3.375 g 12.5 mL/hr over 240 Minutes Intravenous Every 8 hours 12/28/23 2251 12/31/23 1407   12/28/23 1045  piperacillin -tazobactam (ZOSYN ) IVPB 3.375 g        3.375 g 100 mL/hr over 30 Minutes Intravenous  Once 12/28/23 1040 12/28/23 1146        Assessment/Plan POD 1-S/P  LAPAROSCOPIC CHOLECYSTECTOMY - unable to perform IOC due to short, thin walled inflamed cystic duct protected by very large liver, 12/31/23, Dr. Lyndel  -Continue with bariatric diet, patient is tolerating well overall with some concern about  the texture of the food.  -Continue pain management in a multimodal fashion -Promote ambulation as tolerated  -Promote the use of IS - Patient appears HDS -Hgb stable at 9.1, LFTs remain elevated but down trending for the most part. T-Bili down to 1.5 mg/dL.   FEN - Bariatric diet VTE - Lovenox , SCDs ID - Unasyn   Foley - None   I reviewed nursing notes, hospitalist notes, last 24 h vitals and pain scores, last 48 h intake and output, last 24 h labs and trends, and last 24 h imaging results.    LOS: 4 days    Eulah Hammonds, [facility] Surgery 01/01/2024, 9:12 AM Please see Amion for pager number during day hours 7:00am-4:30pm

## 2024-01-01 NOTE — Discharge Summary (Signed)
 Physician Discharge Summary  DAYLE SHERPA FMW:[id number] DOB: 12-Dec-1960 DOA: 12/28/2023  PCP: Corlis Pagan, NP  Admit date: 12/28/2023 Discharge date: 01/01/2024  Admitted From: Home Disposition: Home  Recommendations for Outpatient Follow-up:  Follow up with PCP in 1 week with repeat CBC/CMP Outpatient follow-up with general surgery.  Diet/wound care/pain medications as per general surgery recommendations Outpatient follow-up with GI and cardiology Follow up in ED if symptoms worsen or new appear   Home Health: No Equipment/Devices: None  Discharge Condition: Stable CODE STATUS: Full Diet recommendation: Heart healthy/bariatric diet as per general surgery/fluid restriction of up to 1500 cc a day.  Brief/Interim Summary: 63 y.o. female with medical history significant for fibromyalgia, ADD, depression, HTN, sleep apnea, IBS, CKD, HLD, chronic diastolic HF, morbid obesity, hx SBO and gastric bypass, and anxiety presented with abdominal pain, nausea and vomiting.  On presentation, WBC was 21.8, AST of 150, ALT of 228, bilirubin of 3, lipase of 261.  CT of abdomen and pelvis showed moderate gallbladder distention but no cholelithiasis.  MRCP showed acute cholecystitis and possible choledocholithiasis.  She was started on IV antibiotics.  General surgery was consulted who recommended GI consultation as well.  Cardiology was consulted as well.  She underwent lap chole on 12/31/2023.  Subsequently, patient has done well and general surgery has cleared the patient for discharge.  She will be discharged home today on oral Augmentin .  Discharge Diagnoses:   Acute cholecystitis Gallstone pancreatitis Choledocholithiasis Streptococcus gallolyticus bacteremia Elevated LFTs - Imaging as above.    - LFTs improving with improved WBCs and no temperature spikes since admission. -Blood cultures grew Streptococcus gallolyticus: Possibly from cholecystitis.  Antibiotics switched from Zosyn  to  Unasyn .  Will need total 10-day course of antibiotic therapy. - GI and general surgery following.  Patient might have had passed the CBD stone.   -underwent lap chole on 12/31/2023.  Wound care/diet advancement/pain management as per general surgery.  LFTs continue to improve.  Currently tolerating advance diet.  General surgery cleared the patient for discharge.  Patient wants to go home today.  -Discharge patient home today.  Outpatient follow-up with PCP and general surgery along with GI.    Chronic diastolic CHF  hypertension - Currently compensated.  Blood pressure currently stable.  Continue metoprolol .  Spironolactone  resumed by cardiology.  ARB will remain on hold till reevaluation by cardiology as an outpatient.  Continue diet and fluid restriction. - Cardiology following: Echo showed EF of 55 to 60% with grade 1 diastolic dysfunction.  Cardiology has cleared the patient for discharge.   Leukocytosis - Mild.  Outpatient follow-up  Hypomagnesemia - Replace prior to discharge  Hypoalbuminemia - Mild.  Hopefully will improve after patient goes back to her prior diet.   Fibromyalgia - Continue Lyrica    Anxiety and depression -Continue bupropion  and Effexor    Allergic rhinitis - Continue loratadine  and Flonase    History of migraines - Continue atogepant    OSA - Continue CPAP at bedtime   Morbid obesity class III - Outpatient follow-up   Discharge Instructions  Discharge Instructions     Diet - low sodium heart healthy   Complete by: As directed    Bariatric diet as per Gen surgery   Increase activity slowly   Complete by: As directed       Allergies as of 01/01/2024       Reactions   Lipitor [atorvastatin ] Other (See Comments)   myalgias        Medication List  STOP taking these medications    valsartan  160 MG tablet Commonly known as: Diovan        TAKE these medications    amoxicillin -clavulanate 875-125 MG tablet Commonly known as:  AUGMENTIN  Take 1 tablet by mouth 2 (two) times daily for 7 days.   amphetamine -dextroamphetamine  30 MG 24 hr capsule Commonly known as: ADDERALL XR Take 1 capsule (30 mg total) by mouth in the morning. (01-21-22)   buPROPion  300 MG 24 hr tablet Commonly known as: WELLBUTRIN  XL TAKE 1 TABLET(300 MG) BY MOUTH DAILY   cetirizine 10 MG tablet Commonly known as: ZYRTEC Take 10 mg by mouth daily.   Cholecalciferol  125 MCG (5000 UT) capsule Take 5,000 Units by mouth daily.   cyanocobalamin  1000 MCG/ML injection Commonly known as: VITAMIN B12 Inject 1 mL (1,000 mcg total) into the muscle every 30 (thirty) days.   denosumab 60 MG/ML Sosy injection Commonly known as: PROLIA Inject 60 mg into the skin every 6 (six) months.   fluticasone  50 MCG/ACT nasal spray Commonly known as: FLONASE  Place 1 spray into both nostrils daily.   Lyrica  150 MG capsule Generic drug: pregabalin  Take 150 mg by mouth 2 (two) times daily.   metoprolol  tartrate 25 MG tablet Commonly known as: LOPRESSOR  Take 1 tablet (25 mg total) by mouth 2 (two) times daily.   ondansetron  4 MG tablet Commonly known as: ZOFRAN  Take 1 tablet (4 mg total) by mouth every 6 (six) hours as needed for nausea.   Qulipta  60 MG Tabs Generic drug: Atogepant  TAKE 1 TABLET BY MOUTH EVERY DAY   Repatha  SureClick 140 MG/ML Soaj Generic drug: Evolocumab  Inject 140 mg into the skin every 14 (fourteen) days.   spironolactone  25 MG tablet Commonly known as: ALDACTONE  Take 0.5 tablets (12.5 mg total) by mouth daily.   Venlafaxine  HCl 225 MG Tb24 Take 225 mg by mouth daily with breakfast.   zolpidem  10 MG tablet Commonly known as: AMBIEN  Take 10 mg by mouth at bedtime as needed for sleep.        Follow-up Information     Maczis, Puja Gosai, PA-C Follow up on 01/29/2024.   Specialty: General Surgery Why: 9:15am, Arrive 30 minutes prior to your appointment time, Please bring your insurance card and photo ID Contact  information: [address] [place] [address] [facility] [place] [address] [phone]         Corlis Pagan, NP. Schedule an appointment as soon as possible for a visit in 1 week(s).   Contact information: [address] [place] [address] [place] [address] [phone]                Allergies  Allergen Reactions   Lipitor [Atorvastatin ] Other (See Comments)    myalgias    Consultations: General Surgery/GI/cardiology    Procedures/Studies: DG C-Arm 1-60 Min-No Report Result Date: 12/31/2023 Fluoroscopy was utilized by the requesting physician.  No radiographic interpretation.   ECHOCARDIOGRAM COMPLETE Result Date: 12/31/2023    ECHOCARDIOGRAM REPORT   Patient Name:   Kimberly Castro Date of Exam: 12/31/2023 Medical Rec #:  [id number]           Height:       64.0 in Accession #:    [id number]          Weight:       367.9 lb Date of Birth:  02/23/1961           BSA:          2.535 m  Patient Age:    63 years            BP:           134/71 mmHg Patient Gender: F                   HR:           63 bpm. Exam Location:  Inpatient Procedure: 2D Echo, Cardiac Doppler and Color Doppler (Both Spectral and Color            Flow Doppler were utilized during procedure). Indications:    Pre-op evaluation  History:        Patient has prior history of Echocardiogram examinations, most                 recent 04/02/2019. Risk Factors:Hypertension, Dyslipidemia and                 Morbid obesity.  Referring Phys: [id number] ANGELA NICOLE DUKE IMPRESSIONS  1. Left ventricular ejection fraction, by estimation, is 55 to 60%. The left ventricle has normal function. The left ventricle has no regional wall motion abnormalities. There is mild concentric left ventricular hypertrophy. Left ventricular diastolic parameters are consistent with Grade I diastolic dysfunction (impaired relaxation). Elevated left ventricular end-diastolic pressure.  2. Right ventricular systolic function is  mildly reduced. The right ventricular size is normal. There is moderately elevated pulmonary artery systolic pressure.  3. Left atrial size was severely dilated.  4. The mitral valve is normal in structure. Trivial mitral valve regurgitation. No evidence of mitral stenosis.  5. The aortic valve is tricuspid. There is mild calcification of the aortic valve. There is mild thickening of the aortic valve. Aortic valve regurgitation is trivial. Aortic valve sclerosis/calcification is present, without any evidence of aortic stenosis. Aortic valve area, by VTI measures 2.23 cm. Aortic valve mean gradient measures 7.0 mmHg. Aortic valve Vmax measures 1.87 m/s.  6. The inferior vena cava is dilated in size with <50% respiratory variability, suggesting right atrial pressure of 15 mmHg. FINDINGS  Left Ventricle: Left ventricular ejection fraction, by estimation, is 55 to 60%. The left ventricle has normal function. The left ventricle has no regional wall motion abnormalities. The left ventricular internal cavity size was normal in size. There is  mild concentric left ventricular hypertrophy. Left ventricular diastolic parameters are consistent with Grade I diastolic dysfunction (impaired relaxation). Elevated left ventricular end-diastolic pressure. Right Ventricle: The right ventricular size is normal. No increase in right ventricular wall thickness. Right ventricular systolic function is mildly reduced. There is moderately elevated pulmonary artery systolic pressure. The tricuspid regurgitant velocity is 3.26 m/s, and with an assumed right atrial pressure of 15 mmHg, the estimated right ventricular systolic pressure is 57.5 mmHg. Left Atrium: Left atrial size was severely dilated. Right Atrium: Right atrial size was normal in size. Pericardium: There is no evidence of pericardial effusion. Mitral Valve: The mitral valve is normal in structure. Trivial mitral valve regurgitation. No evidence of mitral valve stenosis.  Tricuspid Valve: The tricuspid valve is normal in structure. Tricuspid valve regurgitation is mild . No evidence of tricuspid stenosis. Aortic Valve: The aortic valve is tricuspid. There is mild calcification of the aortic valve. There is mild thickening of the aortic valve. Aortic valve regurgitation is trivial. Aortic valve sclerosis/calcification is present, without any evidence of aortic stenosis. Aortic valve mean gradient measures 7.0 mmHg. Aortic valve peak gradient measures 14.0 mmHg. Aortic valve area, by VTI measures 2.23 cm. Pulmonic  Valve: The pulmonic valve was normal in structure. Pulmonic valve regurgitation is not visualized. No evidence of pulmonic stenosis. Aorta: The aortic root is normal in size and structure. Venous: The inferior vena cava is dilated in size with less than 50% respiratory variability, suggesting right atrial pressure of 15 mmHg. IAS/Shunts: No atrial level shunt detected by color flow Doppler.  LEFT VENTRICLE PLAX 2D LVIDd:         5.00 cm      Diastology LVIDs:         3.40 cm      LV e' medial:    5.66 cm/s LV PW:         1.40 cm      LV E/e' medial:  15.1 LV IVS:        1.10 cm      LV e' lateral:   5.00 cm/s LVOT diam:     2.20 cm      LV E/e' lateral: 17.1 LV SV:         101 LV SV Index:   40 LVOT Area:     3.80 cm  LV Volumes (MOD) LV vol d, MOD A2C: 109.0 ml LV vol d, MOD A4C: 122.0 ml LV vol s, MOD A2C: 41.5 ml LV vol s, MOD A4C: 49.5 ml LV SV MOD A2C:     67.5 ml LV SV MOD A4C:     122.0 ml LV SV MOD BP:      72.2 ml RIGHT VENTRICLE            IVC RV Basal diam:  3.90 cm    IVC diam: 2.60 cm RV S prime:     8.05 cm/s TAPSE (M-mode): 3.0 cm LEFT ATRIUM             Index LA diam:        4.80 cm 1.89 cm/m LA Vol (A2C):   73.4 ml 28.95 ml/m LA Vol (A4C):   99.2 ml 39.13 ml/m LA Biplane Vol: 94.1 ml 37.12 ml/m  AORTIC VALVE AV Area (Vmax):    2.15 cm AV Area (Vmean):   2.15 cm AV Area (VTI):     2.23 cm AV Vmax:           187.00 cm/s AV Vmean:          125.000 cm/s  AV VTI:            0.452 m AV Peak Grad:      14.0 mmHg AV Mean Grad:      7.0 mmHg LVOT Vmax:         106.00 cm/s LVOT Vmean:        70.700 cm/s LVOT VTI:          0.265 m LVOT/AV VTI ratio: 0.59  AORTA Ao Root diam: 3.10 cm Ao Asc diam:  3.70 cm MITRAL VALVE                TRICUSPID VALVE MV Area (PHT): 3.20 cm     TR Peak grad:   42.5 mmHg MV Decel Time: 237 msec     TR Vmax:        326.00 cm/s MV E velocity: 85.30 cm/s MV A velocity: 103.00 cm/s  SHUNTS MV E/A ratio:  0.83         Systemic VTI:  0.26 m  Systemic Diam: 2.20 cm Annabella Scarce MD Electronically signed by Annabella Scarce MD Signature Date/Time: 12/31/2023/10:02:15 AM    Final    DG CHEST PORT 1 VIEW Result Date: 12/29/2023 CLINICAL DATA:  Cough.  History of pancreatitis. EXAM: PORTABLE CHEST 1 VIEW COMPARISON:  Abdominal CT scan 12/28/2018 FINDINGS: Borderline cardiac enlargement given the AP projection portable technique. Calcification of the thoracic aorta noted. Streaky bibasilar scarring or atelectasis but no infiltrates, effusions or pulmonary edema. No pneumothorax or pulmonary lesions. IMPRESSION: Streaky bibasilar scarring or atelectasis. Electronically Signed   By: MYRTIS Stammer M.D.   On: 12/29/2023 16:36   MR ABDOMEN WITH MRCP W CONTRAST Result Date: 12/28/2023 CLINICAL DATA:  Right upper abdominal pain with gallbladder distension on same day CT EXAM: MRI ABDOMEN WITH CONTRAST (WITH MRCP) TECHNIQUE: Multiplanar multisequence MR imaging of the abdomen was performed following the administration of intravenous contrast. Heavily T2-weighted images of the biliary and pancreatic ducts were obtained, and three-dimensional MRCP images were rendered by post processing. CONTRAST:  10mL GADAVIST  GADOBUTROL  1 MMOL/ML IV SOLN COMPARISON:  Same day CT abdomen and pelvis FINDINGS: Postcontrast sequences are motion degraded. Lower chest: No acute findings.  Multichamber cardiomegaly. Hepatobiliary: No mass or other  parenchymal abnormality identified. Markedly distended gallbladder contains innumerable stones. Mild gallbladder mural thickening and trace pericholecystic free fluid. The cystic duct appears dilated, measuring up to 1.7 cm. No dilation of the common bile duct, which appears to contain a 4 mm filling defect in the downstream duct (19:14). Pancreas: No mass, inflammatory changes, or other parenchymal abnormality identified. Spleen:  Enlarged spleen measures 15.2 cm. Adrenals/Urinary Tract: No adrenal nodules. No suspicious renal masses identified. No evidence of hydronephrosis. Bilateral subcentimeter T2 hyperintense simple cysts. No specific follow-up imaging recommended. Stomach/Bowel: Postsurgical changes of Roux-en-Y gastric bypass. Small hiatal hernia. Vascular/Lymphatic: No pathologically enlarged lymph nodes identified. No abdominal aortic aneurysm demonstrated. Other:  None. Musculoskeletal: No suspicious bone lesions identified. IMPRESSION: 1. Markedly distended gallbladder contains innumerable stones with mild gallbladder mural thickening and trace pericholecystic free fluid, suspicious for acute cholecystitis. 2. Nondilated common bile duct, which appears to contain a 4 mm filling defect in the downstream duct, suspicious for choledocholithiasis. 3. Splenomegaly. 4. Multichamber cardiomegaly. 5. Small hiatal hernia. Electronically Signed   By: Limin  Xu M.D.   On: 12/28/2023 16:57   CT ABDOMEN PELVIS W CONTRAST Result Date: 12/28/2023 CLINICAL DATA:  Acute generalized abdominal pain. EXAM: CT ABDOMEN AND PELVIS WITH CONTRAST TECHNIQUE: Multidetector CT imaging of the abdomen and pelvis was performed using the standard protocol following bolus administration of intravenous contrast. RADIATION DOSE REDUCTION: This exam was performed according to the departmental dose-optimization program which includes automated exposure control, adjustment of the mA and/or kV according to patient size and/or use of  iterative reconstruction technique. CONTRAST:  OMNIPAQUE  IOHEXOL  300 MG/ML  SOLN COMPARISON:  January 30, 2020.  August 12, 2016. FINDINGS: Lower chest: No acute abnormality. Hepatobiliary: Moderate gallbladder distention is noted. No cholelithiasis or biliary dilatation is noted. Liver is unremarkable. Pancreas: Unremarkable. No pancreatic ductal dilatation or surrounding inflammatory changes. Spleen: Normal in size without focal abnormality. Adrenals/Urinary Tract: Adrenal glands are unremarkable. Kidneys are normal, without renal calculi, focal lesion, or hydronephrosis. Bladder is unremarkable. Stomach/Bowel: Small sliding-type hiatal hernia. Status post gastric bypass. The appendix appears normal. There is no evidence of bowel obstruction or inflammation. Vascular/Lymphatic: Aortic atherosclerosis. No enlarged abdominal or pelvic lymph nodes. Reproductive: Status post hysterectomy. No adnexal masses. Other: No ascites or hernia is noted. Musculoskeletal:  No acute or significant osseous findings. IMPRESSION: Moderate gallbladder distension. No cholelithiasis or inflammatory changes are noted. Small sliding-type hiatal hernia. Status post gastric bypass. Aortic Atherosclerosis (ICD10-I70.0). Electronically Signed   By: Lynwood Landy Raddle M.D.   On: 12/28/2023 11:38      Subjective: Patient seen and examined at bedside.  Tolerating diet.  Hoping to go home today.  No fever, worsening shortness of breath or vomiting reported.   Discharge Exam: Vitals:   12/31/23 2108 01/01/24 0543  BP: 125/70 (!) 113/54  Pulse: (!) 58 64  Resp: 18 18  Temp: 97.9 F (36.6 C) 97.9 F (36.6 C)  SpO2: 96% 97%      General: Currently on room air and in no distress  ENT/neck: No obvious thyromegaly or elevated JVD noted  respiratory: Decreased breath sounds at bases bilaterally with some crackles CVS: Mild intermittent bradycardia present; S1 and S2 heard  abdominal: Soft, morbidly obese, tender in the right  upper quadrant with some distention; no organomegaly, normal bowel sounds heard. Extremities: Trace bilateral lower extremity edema; no cyanosis CNS: Awake and alert.  No focal deficits noted  lymph: No lymphadenopathy palpable Skin: No obvious ecchymosis/lesions psych: Flat affect mostly.  Not agitated musculoskeletal: No obvious joint deformity/swelling    The results of significant diagnostics from this hospitalization (including imaging, microbiology, ancillary and laboratory) are listed below for reference.     Microbiology: Recent Results (from the past 240 hours)  Culture, blood (single)     Status: Abnormal   Collection Time: 12/28/23  9:46 AM   Specimen: BLOOD  Result Value Ref Range Status   Specimen Description   Final    BLOOD LEFT ANTECUBITAL Performed at [facility], [address], [place], [place] [address]    Special Requests   Final    BOTTLES DRAWN AEROBIC AND ANAEROBIC Blood Culture adequate volume Performed at [facility], [address] [place], [place] [address]    Culture  Setup Time   Final    GRAM POSITIVE COCCI ANAEROBIC BOTTLE ONLY CRITICAL RESULT CALLED TO, READ BACK BY AND VERIFIED WITH: PHARMD ABBY BETHENE [id number] AT 0836 BY EC Performed at [facility], [address], [place], [place] [address]    Culture STREPTOCOCCUS GALLOLYTICUS (A)  Final   Report Status 12/31/2023 FINAL  Final   Organism ID, Bacteria STREPTOCOCCUS GALLOLYTICUS  Final      Susceptibility   Streptococcus gallolyticus - MIC*    PENICILLIN 0.12 SENSITIVE Sensitive     CEFTRIAXONE 0.25 SENSITIVE Sensitive     ERYTHROMYCIN >=8 RESISTANT Resistant     LEVOFLOXACIN 4 INTERMEDIATE Intermediate     VANCOMYCIN 0.5 SENSITIVE Sensitive     * STREPTOCOCCUS GALLOLYTICUS  Blood Culture ID Panel (Reflexed)     Status: Abnormal   Collection Time: 12/28/23  9:46 AM  Result Value Ref Range Status   Enterococcus faecalis NOT DETECTED  NOT DETECTED Final   Enterococcus Faecium NOT DETECTED NOT DETECTED Final   Listeria monocytogenes NOT DETECTED NOT DETECTED Final   Staphylococcus species NOT DETECTED NOT DETECTED Final   Staphylococcus aureus (BCID) NOT DETECTED NOT DETECTED Final   Staphylococcus epidermidis NOT DETECTED NOT DETECTED Final   Staphylococcus lugdunensis NOT DETECTED NOT DETECTED Final   Streptococcus species DETECTED (A) NOT DETECTED Final    Comment: Not Enterococcus species, Streptococcus agalactiae, Streptococcus pyogenes, or Streptococcus pneumoniae. CRITICAL RESULT CALLED TO, READ BACK BY AND VERIFIED WITH: PHARMD ABBY ELLINGTON [id number] AT 0836 BY EC  Streptococcus agalactiae NOT DETECTED NOT DETECTED Final   Streptococcus pneumoniae NOT DETECTED NOT DETECTED Final   Streptococcus pyogenes NOT DETECTED NOT DETECTED Final   A.calcoaceticus-baumannii NOT DETECTED NOT DETECTED Final   Bacteroides fragilis NOT DETECTED NOT DETECTED Final   Enterobacterales NOT DETECTED NOT DETECTED Final   Enterobacter cloacae complex NOT DETECTED NOT DETECTED Final   Escherichia coli NOT DETECTED NOT DETECTED Final   Klebsiella aerogenes NOT DETECTED NOT DETECTED Final   Klebsiella oxytoca NOT DETECTED NOT DETECTED Final   Klebsiella pneumoniae NOT DETECTED NOT DETECTED Final   Proteus species NOT DETECTED NOT DETECTED Final   Salmonella species NOT DETECTED NOT DETECTED Final   Serratia marcescens NOT DETECTED NOT DETECTED Final   Haemophilus influenzae NOT DETECTED NOT DETECTED Final   Neisseria meningitidis NOT DETECTED NOT DETECTED Final   Pseudomonas aeruginosa NOT DETECTED NOT DETECTED Final   Stenotrophomonas maltophilia NOT DETECTED NOT DETECTED Final   Candida albicans NOT DETECTED NOT DETECTED Final   Candida auris NOT DETECTED NOT DETECTED Final   Candida glabrata NOT DETECTED NOT DETECTED Final   Candida krusei NOT DETECTED NOT DETECTED Final   Candida parapsilosis NOT DETECTED NOT DETECTED Final    Candida tropicalis NOT DETECTED NOT DETECTED Final   Cryptococcus neoformans/gattii NOT DETECTED NOT DETECTED Final    Comment: Performed at [facility], [address], [place], [place] [address]     Labs: BNP (last 3 results) Recent Labs    03/20/23 0811  BNP 76.8   Basic Metabolic Panel: Recent Labs  Lab 12/29/23 0610 12/29/23 1525 12/30/23 0030 12/31/23 0316 01/01/24 0602  NA 136 137 138 138 137  K 4.2 4.3 3.9 3.6 4.2  CL 103 105 106 106 105  CO2 25 24 21* 24 25  GLUCOSE 98 93 94 98 97  BUN 27* 27* 23 19 19   CREATININE 1.31* 1.30* 1.01* 0.84 0.85  CALCIUM  7.2* 7.4* 7.4* 7.6* 7.5*  MG  --   --   --  1.5* 1.6*   Liver Function Tests: Recent Labs  Lab 12/28/23 0947 12/29/23 0610 12/30/23 0030 12/31/23 0316 01/01/24 0602  AST 150* 145* 101* 52* 59*  ALT 228* 163* 125* 89* 83*  ALKPHOS 384* 257* 229* 216* 202*  BILITOT 3.0* 4.1* 2.8* 2.1* 1.5*  PROT 7.7 6.6 6.1* 5.9* 6.4*  ALBUMIN  4.0 3.0* 2.5* 2.3* 2.4*   Recent Labs  Lab 12/28/23 0947 12/30/23 0030  LIPASE 261* 29   No results for input(s): AMMONIA in the last 168 hours. CBC: Recent Labs  Lab 12/28/23 0947 12/29/23 0610 12/29/23 1525 12/30/23 0030 12/31/23 0316 01/01/24 0602  WBC 21.8* 14.1*  --  12.4* 10.1 11.1*  NEUTROABS 19.8*  --   --   --  7.1 8.2*  HGB 11.8* 10.3* 10.0* 9.5* 9.4* 9.1*  HCT 37.2 33.5* 32.4* 30.8* 30.2* 29.6*  MCV 78.8* 81.9  --  80.8 80.7 81.8  PLT 344 261  --  218 215 260   Cardiac Enzymes: No results for input(s): CKTOTAL, CKMB, CKMBINDEX, TROPONINI in the last 168 hours. BNP: Invalid input(s): POCBNP CBG: No results for input(s): GLUCAP in the last 168 hours. D-Dimer No results for input(s): DDIMER in the last 72 hours. Hgb A1c No results for input(s): HGBA1C in the last 72 hours. Lipid Profile No results for input(s): CHOL, HDL, LDLCALC, TRIG, CHOLHDL, LDLDIRECT in the last 72 hours. Thyroid function studies No results  for input(s): TSH, T4TOTAL, T3FREE, THYROIDAB in the last 72 hours.  Invalid input(s): FREET3 Anemia work up No results for input(s): VITAMINB12, FOLATE, FERRITIN, TIBC, IRON, RETICCTPCT in the last 72 hours. Urinalysis    Component Value Date/Time   COLORURINE YELLOW 12/28/2023 1335   APPEARANCEUR CLEAR 12/28/2023 1335   LABSPEC >1.046 (H) 12/28/2023 1335   PHURINE 6.0 12/28/2023 1335   GLUCOSEU NEGATIVE 12/28/2023 1335   HGBUR TRACE (A) 12/28/2023 1335   BILIRUBINUR SMALL (A) 12/28/2023 1335   KETONESUR NEGATIVE 12/28/2023 1335   PROTEINUR 30 (A) 12/28/2023 1335   NITRITE NEGATIVE 12/28/2023 1335   LEUKOCYTESUR TRACE (A) 12/28/2023 1335   Sepsis Labs Recent Labs  Lab 12/29/23 0610 12/30/23 0030 12/31/23 0316 01/01/24 0602  WBC 14.1* 12.4* 10.1 11.1*   Microbiology Recent Results (from the past 240 hours)  Culture, blood (single)     Status: Abnormal   Collection Time: 12/28/23  9:46 AM   Specimen: BLOOD  Result Value Ref Range Status   Specimen Description   Final    BLOOD LEFT ANTECUBITAL Performed at [facility], [address], [place], [place] [address]    Special Requests   Final    BOTTLES DRAWN AEROBIC AND ANAEROBIC Blood Culture adequate volume Performed at [facility], [address] [place], [place] [address]    Culture  Setup Time   Final    GRAM POSITIVE COCCI ANAEROBIC BOTTLE ONLY CRITICAL RESULT CALLED TO, READ BACK BY AND VERIFIED WITH: PHARMD ABBY ELLINGTON [id number] AT 0836 BY EC Performed at [facility], [address], [place], [place] [address]    Culture STREPTOCOCCUS GALLOLYTICUS (A)  Final   Report Status 12/31/2023 FINAL  Final   Organism ID, Bacteria STREPTOCOCCUS GALLOLYTICUS  Final      Susceptibility   Streptococcus gallolyticus - MIC*    PENICILLIN 0.12 SENSITIVE Sensitive     CEFTRIAXONE 0.25 SENSITIVE Sensitive     ERYTHROMYCIN >=8 RESISTANT Resistant      LEVOFLOXACIN 4 INTERMEDIATE Intermediate     VANCOMYCIN 0.5 SENSITIVE Sensitive     * STREPTOCOCCUS GALLOLYTICUS  Blood Culture ID Panel (Reflexed)     Status: Abnormal   Collection Time: 12/28/23  9:46 AM  Result Value Ref Range Status   Enterococcus faecalis NOT DETECTED NOT DETECTED Final   Enterococcus Faecium NOT DETECTED NOT DETECTED Final   Listeria monocytogenes NOT DETECTED NOT DETECTED Final   Staphylococcus species NOT DETECTED NOT DETECTED Final   Staphylococcus aureus (BCID) NOT DETECTED NOT DETECTED Final   Staphylococcus epidermidis NOT DETECTED NOT DETECTED Final   Staphylococcus lugdunensis NOT DETECTED NOT DETECTED Final   Streptococcus species DETECTED (A) NOT DETECTED Final    Comment: Not Enterococcus species, Streptococcus agalactiae, Streptococcus pyogenes, or Streptococcus pneumoniae. CRITICAL RESULT CALLED TO, READ BACK BY AND VERIFIED WITH: PHARMD ABBY BETHENE [id number] AT 0836 BY EC    Streptococcus agalactiae NOT DETECTED NOT DETECTED Final   Streptococcus pneumoniae NOT DETECTED NOT DETECTED Final   Streptococcus pyogenes NOT DETECTED NOT DETECTED Final   A.calcoaceticus-baumannii NOT DETECTED NOT DETECTED Final   Bacteroides fragilis NOT DETECTED NOT DETECTED Final   Enterobacterales NOT DETECTED NOT DETECTED Final   Enterobacter cloacae complex NOT DETECTED NOT DETECTED Final   Escherichia coli NOT DETECTED NOT DETECTED Final   Klebsiella aerogenes NOT DETECTED NOT DETECTED Final   Klebsiella oxytoca NOT DETECTED NOT DETECTED Final   Klebsiella pneumoniae NOT DETECTED NOT DETECTED Final   Proteus species NOT DETECTED NOT DETECTED Final   Salmonella species NOT DETECTED NOT DETECTED Final  Serratia marcescens NOT DETECTED NOT DETECTED Final   Haemophilus influenzae NOT DETECTED NOT DETECTED Final   Neisseria meningitidis NOT DETECTED NOT DETECTED Final   Pseudomonas aeruginosa NOT DETECTED NOT DETECTED Final   Stenotrophomonas maltophilia NOT  DETECTED NOT DETECTED Final   Candida albicans NOT DETECTED NOT DETECTED Final   Candida auris NOT DETECTED NOT DETECTED Final   Candida glabrata NOT DETECTED NOT DETECTED Final   Candida krusei NOT DETECTED NOT DETECTED Final   Candida parapsilosis NOT DETECTED NOT DETECTED Final   Candida tropicalis NOT DETECTED NOT DETECTED Final   Cryptococcus neoformans/gattii NOT DETECTED NOT DETECTED Final    Comment: Performed at [facility], [address] [place], [place] [address]     Time coordinating discharge: 35 minutes  SIGNED:   Sophie Mao, MD  [facility] 01/01/2024, 10:32 AM

## 2024-01-01 NOTE — Progress Notes (Signed)
 TOC meds in a secure bag delivered to patient in room  by this RN

## 2024-01-01 NOTE — Progress Notes (Signed)
Discharge instructions given to patient and family, questions asked and answered D Mateo Flow RN

## 2024-01-22 ENCOUNTER — Other Ambulatory Visit (HOSPITAL_COMMUNITY): Payer: Self-pay | Admitting: Gastroenterology

## 2024-01-22 ENCOUNTER — Ambulatory Visit (HOSPITAL_BASED_OUTPATIENT_CLINIC_OR_DEPARTMENT_OTHER): Admitting: Family

## 2024-01-22 DIAGNOSIS — K8050 Calculus of bile duct without cholangitis or cholecystitis without obstruction: Secondary | ICD-10-CM

## 2024-01-23 ENCOUNTER — Ambulatory Visit (HOSPITAL_COMMUNITY)
Admission: RE | Admit: 2024-01-23 | Discharge: 2024-01-23 | Disposition: A | Discharge status: Home / Self Care | Source: Ambulatory Visit | Attending: Gastroenterology | Admitting: Gastroenterology

## 2024-01-23 ENCOUNTER — Other Ambulatory Visit (HOSPITAL_COMMUNITY): Payer: Self-pay | Admitting: Gastroenterology

## 2024-01-23 DIAGNOSIS — K8050 Calculus of bile duct without cholangitis or cholecystitis without obstruction: Secondary | ICD-10-CM | POA: Diagnosis present

## 2024-01-23 MED ORDER — GADOBUTROL 1 MMOL/ML IV SOLN
10.0000 mL | Freq: Once | INTRAVENOUS | Status: AC | PRN
  Administered 2024-01-23: 10 mL via INTRAVENOUS

## 2024-01-28 ENCOUNTER — Other Ambulatory Visit (HOSPITAL_BASED_OUTPATIENT_CLINIC_OR_DEPARTMENT_OTHER): Payer: Self-pay | Admitting: Family

## 2024-01-28 DIAGNOSIS — I10 Essential (primary) hypertension: Secondary | ICD-10-CM

## 2024-01-28 DIAGNOSIS — I700 Atherosclerosis of aorta: Secondary | ICD-10-CM

## 2024-01-28 DIAGNOSIS — I350 Nonrheumatic aortic (valve) stenosis: Secondary | ICD-10-CM

## 2024-02-08 ENCOUNTER — Ambulatory Visit (HOSPITAL_BASED_OUTPATIENT_CLINIC_OR_DEPARTMENT_OTHER): Admitting: Family

## 2024-02-20 ENCOUNTER — Telehealth: Payer: Self-pay | Admitting: Pharmacy Technician

## 2024-02-20 NOTE — Telephone Encounter (Signed)
   Pharmacy Patient Advocate Encounter   Received notification from CoverMyMeds that prior authorization for nexlizet  is required/requested.   Insurance verification completed.   The patient is insured through [facility] .   Per test claim: PA required; PA submitted to above mentioned insurance via Latent Key/confirmation #/EOC [facility] Status is pending

## 2024-02-21 NOTE — Telephone Encounter (Signed)
 Pharmacy Patient Advocate Encounter  Received notification from [employer] that Prior Authorization for nexlizet  has been APPROVED from 02/21/24 to 06/18/2038   PA #/Case ID/Reference #: [id number]    If the prescription can be sent to her pharmacy now, thank you

## 2024-02-21 NOTE — Telephone Encounter (Addendum)
 Nexlizet  not on current medication list, mychart message to patient

## 2024-02-25 NOTE — Telephone Encounter (Signed)
 Okay to leave off med list and follow up as scheduled 03/13/24. TY! Vane Yapp S Braelen Sproule, NP

## 2024-02-25 NOTE — Telephone Encounter (Signed)
 Patient responded to mychart message   No iam not. I had diarrhea while taking it.Stopped taking it and it stopped. Waited about two weeks and started it again.Diarrhea came back   Forwarded to Reche ORN NP as RICK

## 2024-03-12 ENCOUNTER — Encounter: Payer: Self-pay | Admitting: *Deleted

## 2024-03-12 NOTE — Progress Notes (Unsigned)
 Cardiology Office Note:  .   Date:  03/13/2024  ID:  Kimberly Castro, DOB 11/06/1960, MRN [id number] PCP: Corlis Pagan, NP  [facility] Providers Cardiologist:  [place] Scarce, MD    History of Present Illness: .    Kimberly Castro is a 63 y.o. female with OSA on CPAP, HFpEF, hyperlipidemia, hypertension, morbid obesity s/p gastric bypass, and fibromyalgia, who presents for follow-up.  She was first seen 03/2016 for an evaluation of palpitations.  She was referred for 14-day event monitor that showed PACs and PVCs.  She had laboratory testing that revealed normal blood counts and normal electrolytes. 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  Her thyroid function was normal. D-dimer was mildly elevated but she had a subsequent CT angiogram of the chest that was negative for PE.  She had an echo 03/2017 that revealed LVEF 55-60%.  She noted exertional dyspnea and went for Lexiscan  Myoview  04/2017 that revealed LVEF 55% and breast attenuation artifact but no ischemia.   In 04/2019 Kimberly Castro reported exertional dyspnea.  She had a Lexiscan  Myoview  05/2019 that revealed LVEF 60% and no ischemia.  She also had an echocardiogram at that time that revealed LVEF 61% with grade 2 diastolic dysfunction.  Right atrial pressure was 8 mmHg.  I forgot we have that thank you.  When he did not think he had a left so I mean this is my personal I usually actually you even when I will again I like VPN and okay okay and this is my work on but I did not use that at a and I think it to for 7500 yeah I am really not sure what is now late the only other computer I use areas I do not think I was I was on abdomen that there is so I was not ever like but 1 in the workspace up there workspace yeah   At her virtual visit 05/2020, she was struggling with recurrent UTIs and had two procedures to repair of her cystocele and rectocele. She was recovering well but  complained of fatigue. Thyroid function was checked a year prior and was normal. She continued to use her CPAP regularly and slept well at night. She had been reading that atorvastatin  can cause fatigue and wondered if this was contributing. She was advised to try holding atorvastatin  for 2 weeks to see if her fatigue would improve; if not then she would resume atorvastatin . She was not monitoring blood pressures at home but had many OB appointments with normal blood pressures. 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  At her visit 02/2023 she noted swelling in her ankles and some shortness of breath.  She struggled with weight gain after stopping Mounjaro  due to cost.  She continued to work with the weight loss clinic.  Her Lasix  was changed to daily and hydrochlorothiazide  was discontinued.  She was also started on valsartan .  She followed up with Reche Finder, NP and was started on spironolactone  for persistent edema.  She had venous studies revealing reflux in the GSSV and common femoral.  She was referred to lymphedema clinic as this was not thought to be the cause of her swelling.  They ordered compression capris and a lymphedema pump.  She was admitted 12/2023 with cholecystitis and cardiology saw her for preoperative risk assessment.  No testing was required and she underwent surgery  without complication.    Echo revealed LVEF 55-60% with mild LVH and grade 1 diastolic dysfunction.  RA pressure 15 mmHg.    Discussed the use of AI scribe software for clinical note transcription with the patient, who gave verbal consent to proceed.  History of Present Illness Kimberly Castro experiences significant swelling in her legs, particularly by the evening after work, with her legs becoming 'twice the size' and hanging over her shoes. She is unable to use the leg pumps due to knee pain and is awaiting the arrival of Capri compression garments. She uses the  pump on her right leg but cannot tolerate it on the left. Her breathing has improved, although she is limited in her walking distance. She reports terrible fluid retention in her legs, which is exacerbated by her inability to use the leg pumps effectively.  She has ongoing pain in her left knee following a torn meniscus, for which she underwent surgery on March 08, 2024. The pain is described as excruciating, especially when using leg pumps for her lymphedema, and she is currently using a walker at work due to the discomfort.  She is taking metoprolol , spironolactone , and valsartan  for blood pressure. She is also on Repatha . Recent labs in July showed normal kidney function and potassium levels, with liver enzymes normalizing after gallbladder issues earlier this year.   ROS:  As per HPI  Studies Reviewed: .       Echo 12/31/23:  1. Left ventricular ejection fraction, by estimation, is 55 to 60%. The  left ventricle has normal function. The left ventricle has no regional  wall motion abnormalities. There is mild concentric left ventricular  hypertrophy. Left ventricular diastolic  parameters are consistent with Grade I diastolic dysfunction (impaired  relaxation). Elevated left ventricular end-diastolic pressure.   2. Right ventricular systolic function is mildly reduced. The right  ventricular size is normal. There is moderately elevated pulmonary artery  systolic pressure.   3. Left atrial size was severely dilated.   4. The mitral valve is normal in structure. Trivial mitral valve  regurgitation. No evidence of mitral stenosis.   5. The aortic valve is tricuspid. There is mild calcification of the  aortic valve. There is mild thickening of the aortic valve. Aortic valve  regurgitation is trivial. Aortic valve sclerosis/calcification is present,  without any evidence of aortic  stenosis. Aortic valve area, by VTI measures 2.23 cm. Aortic valve mean  gradient measures 7.0 mmHg.  Aortic valve Vmax measures 1.87 m/s.   6. The inferior vena cava is dilated in size with <50% respiratory  variability, suggesting right atrial pressure of 15 mmHg.    Risk Assessment/Calculations:            Physical Exam:   VS:  BP 122/60   Pulse 64   Ht 5' 4 (1.626 m)   Wt (!) 359 lb (162.8 kg)   SpO2 96%   BMI 61.62 kg/m  , BMI Body mass index is 61.62 kg/m. GENERAL:  Well appearing HEENT: Pupils equal round and reactive, fundi not visualized, oral mucosa unremarkable NECK:  No jugular venous distention, waveform within normal limits, carotid upstroke brisk and symmetric, no bruits, no thyromegaly LUNGS:  Clear to auscultation bilaterally HEART:  RRR.  PMI not displaced or sustained,S1 and S2 within normal limits, no S3, no S4, no clicks, no rubs, II/VI systolic murmurs ABD:  Flat, positive bowel sounds normal in frequency in pitch, no bruits, no rebound, no guarding, no midline pulsatile mass,  no hepatomegaly, no splenomegaly EXT:  2 plus pulses throughout, tense 2+ LE edema, no cyanosis no clubbing SKIN:  No rashes no nodules NEURO:  Cranial nerves II through XII grossly intact, motor grossly intact throughout PSYCH:  Cognitively intact, oriented to person place and time   ASSESSMENT AND PLAN: .    Assessment & Plan # Diastolic heart failure Suspected diastolic dysfunction causing fluid retention despite strong heart function.  RA pressure 15 mmHg on echo - Initiate SGLT2 inhibitor (Farxiga  or Jardiance) based on insurance preference. - Monitor for urinary tract infections. - Continue current antihypertensives  # Lymphedema of lower extremities Severe lymphedema exacerbated by knee pain limiting use of leg pumps. Awaiting Capri compression garments. - Await arrival of Capri compression garments. - Reassess lymphedema management post-knee surgery.  # Primary hypertension Blood pressure well-controlled on metoprolol , spironolactone , and valsartan .  # Pure  hypercholesterolemia Tolerating Repatha  well. Normal kidney function and potassium levels. Liver enzymes normalized post-gallbladder issues.  # Morbid obesity:  # Weight management and sleep apnea Discussed GLP-1 receptor agonists for weight management and sleep apnea. Insurance coverage may be possible under sleep apnea indication. - Refer to pharmacist to explore insurance coverage for GLP-1 receptor agonists.  # Pre-operative risk:  No ischemic symptoms.  Her LV is normal on echo and her only limitation is her knee.  She is at acceptable risk for surgery.   # OSA:  Continue CPAP.         Dispo: f/u   Signed, [place] Scarce, MD

## 2024-03-13 ENCOUNTER — Ambulatory Visit (INDEPENDENT_AMBULATORY_CARE_PROVIDER_SITE_OTHER): Admitting: Cardiovascular Disease

## 2024-03-13 ENCOUNTER — Encounter (HOSPITAL_BASED_OUTPATIENT_CLINIC_OR_DEPARTMENT_OTHER): Payer: Self-pay | Admitting: Cardiovascular Disease

## 2024-03-13 VITALS — BP 122/60 | HR 64 | Ht 64.0 in | Wt 359.0 lb

## 2024-03-13 DIAGNOSIS — Z9884 Bariatric surgery status: Secondary | ICD-10-CM

## 2024-03-13 DIAGNOSIS — R002 Palpitations: Secondary | ICD-10-CM

## 2024-03-13 DIAGNOSIS — I700 Atherosclerosis of aorta: Secondary | ICD-10-CM | POA: Diagnosis not present

## 2024-03-13 DIAGNOSIS — I10 Essential (primary) hypertension: Secondary | ICD-10-CM

## 2024-03-13 DIAGNOSIS — I5030 Unspecified diastolic (congestive) heart failure: Secondary | ICD-10-CM | POA: Insufficient documentation

## 2024-03-13 DIAGNOSIS — G4733 Obstructive sleep apnea (adult) (pediatric): Secondary | ICD-10-CM

## 2024-03-13 DIAGNOSIS — E7800 Pure hypercholesterolemia, unspecified: Secondary | ICD-10-CM

## 2024-03-13 DIAGNOSIS — I5033 Acute on chronic diastolic (congestive) heart failure: Secondary | ICD-10-CM

## 2024-03-13 DIAGNOSIS — Z6841 Body Mass Index (BMI) 40.0 and over, adult: Secondary | ICD-10-CM

## 2024-03-13 MED ORDER — DAPAGLIFLOZIN PROPANEDIOL 10 MG PO TABS: 10.0000 mg | ORAL_TABLET | Freq: Every day | ORAL | 5 refills | Status: AC

## 2024-03-13 NOTE — Patient Instructions (Addendum)
 Medication Instructions:  START FARXIGA  10 MG DAILY   *If you need a refill on your cardiac medications before your next appointment, please call your pharmacy*  Lab Work: NONE If you have labs (blood work) drawn today and your tests are completely normal, you will receive your results only by: MyChart Message (if you have MyChart) OR A paper copy in the mail If you have any lab test that is abnormal or we need to change your treatment, we will call you to review the results.  Testing/Procedures: NONE  Follow-Up: At [facility], you and your health needs are our priority.  As part of our continuing mission to provide you with exceptional heart care, our providers are all part of one team.  This team includes your primary Cardiologist (physician) and Advanced Practice Providers or APPs (Physician Assistants and Nurse Practitioners) who all work together to provide you with the care you need, when you need it.  Your next appointment:   3 TO 4  month(s)  Provider:   [place] Scarce, MD, [place] Bane, NP, or Reche Finder, NP    You have been referred to [facility]    We recommend signing up for the patient portal called MyChart.  Sign up information is provided on this After Visit Summary.  MyChart is used to connect with patients for Virtual Visits (Telemedicine).  Patients are able to view lab/test results, encounter notes, upcoming appointments, etc.  Non-urgent messages can be sent to your provider as well.   To learn more about what you can do with MyChart, go to [email or web address].

## 2024-04-03 NOTE — Progress Notes (Addendum)
 COVID Vaccine Completed:  Date of COVID positive in last 90 days:  PCP - Izetta Cork, NP Cardiologist - [place] Scarce, MD  Cardiac clearance by [place] Scarce, MD 03/13/24 in Epic  Chest x-ray - 12/29/23 Epic EKG - 12/30/23 Epic Stress Test - 05/28/19 Epic ECHO - 12/31/23 Epic Cardiac Cath -  Pacemaker/ICD device last checked:N/A Spinal Cord Stimulator: Sacral implant for bladder stimulator  Bowel Prep - N/A  Sleep Study - yes CPAP - yes every night   Fasting Blood Sugar - N/A Checks Blood Sugar _____ times a day  Last dose of GLP1 agonist-  N/A GLP1 instructions:  Do not take after     Last dose of SGLT-2 inhibitors-  N/A SGLT-2 instructions:  Do not take after     Blood Thinner Instructions: N/A Last dose:   Time: Aspirin Instructions:N/A Last Dose:  Activity level: Can go up a flight of stairs (difficult due to leg injury)  and perform activities of daily living without stopping and without symptoms of chest pain or shortness of breath. Currently using cane at all times due to leg injury   Anesthesia review: HTN, HFrEF, aortic atherosclerosis, Hgb 9.7  Patient denies shortness of breath, fever, cough and chest pain at PAT appointment  Patient verbalized understanding of instructions that were given to them at the PAT appointment. Patient was also instructed that they will need to review over the PAT instructions again at home before surgery.

## 2024-04-03 NOTE — Patient Instructions (Addendum)
 SURGICAL WAITING ROOM VISITATION  Patients having surgery or a procedure may have no more than 2 support people in the waiting area - these visitors may rotate.    Children under the age of 45 must have an adult with them who is not the patient.  Visitors with respiratory illnesses are discouraged from visiting and should remain at home.  If the patient needs to stay at the hospital during part of their recovery, the visitor guidelines for inpatient rooms apply. Pre-op nurse will coordinate an appropriate time for 1 support person to accompany patient in pre-op.  This support person may not rotate.    Please refer to the [facility] website for the visitor guidelines for Inpatients (after your surgery is over and you are in a regular room).    Your procedure is scheduled on: 04/11/24   Report to [facility] Main Entrance    Report to admitting at 10:00 AM   Call this number if you have problems the morning of surgery [phone]   Do not eat food :After Midnight.   After Midnight you may have the following liquids until 10:00 AM DAY OF SURGERY  Water Non-Citrus Juices (without pulp, NO RED-Apple, White grape, White cranberry) Black Coffee (NO MILK/CREAM OR CREAMERS, sugar ok)  Clear Tea (NO MILK/CREAM OR CREAMERS, sugar ok) regular and decaf                             Plain Jell-O (NO RED)                                           Fruit ices (not with fruit pulp, NO RED)                                     Popsicles (NO RED)                                                               Sports drinks like Gatorade (NO RED)     The day of surgery:  Drink ONE (1) Pre-Surgery Clear Ensure at 9:13 AM the morning of surgery. Drink in one sitting. Do not sip.  This drink was given to you during your hospital  pre-op appointment visit. Nothing else to drink after completing the  Pre-Surgery Clear Ensure.          If you have questions, please contact your surgeon's  office.   FOLLOW BOWEL PREP AND ANY ADDITIONAL PRE OP INSTRUCTIONS YOU RECEIVED FROM YOUR SURGEON'S OFFICE!!!     Oral Hygiene is also important to reduce your risk of infection.                                    Remember - BRUSH YOUR TEETH THE MORNING OF SURGERY WITH YOUR REGULAR TOOTHPASTE  DENTURES WILL BE REMOVED PRIOR TO SURGERY PLEASE DO NOT APPLY Poly grip OR ADHESIVES!!!   Stop all vitamins and herbal supplements 7 days before  surgery.   Take these medicines the morning of surgery with A SIP OF WATER: Tylenol , Bupropion , Zyrtec, Zetia , Gabapentin, Lyrica , Metoprolol , Venlafaxine , Tramadol  Bring CPAP mask and tubing day of surgery.                              You may not have any metal on your body including hair pins, jewelry, and body piercing             Do not wear make-up, lotions, powders, perfumes, or deodorant  Do not wear nail polish including gel and S&S, artificial/acrylic nails, or any other type of covering on natural nails including finger and toenails. If you have artificial nails, gel coating, etc. that needs to be removed by a nail salon please have this removed prior to surgery or surgery may need to be canceled/ delayed if the surgeon/ anesthesia feels like they are unable to be safely monitored.   Do not shave  48 hours prior to surgery.    Do not bring valuables to the hospital. [facility] IS NOT             RESPONSIBLE   FOR VALUABLES.   Contacts, glasses, dentures or bridgework may not be worn into surgery.   Bring small overnight bag day of surgery.   DO NOT BRING YOUR HOME MEDICATIONS TO THE HOSPITAL. PHARMACY WILL DISPENSE MEDICATIONS LISTED ON YOUR MEDICATION LIST TO YOU DURING YOUR ADMISSION IN THE HOSPITAL!   Special Instructions: Bring a copy of your healthcare power of attorney and living will documents the day of surgery if you haven't scanned them before.              Please read over the following fact sheets you were given: IF YOU  HAVE QUESTIONS ABOUT YOUR PRE-OP INSTRUCTIONS PLEASE CALL [phone].   If you received a COVID test during your pre-op visit  it is requested that you wear a mask when out in public, stay away from anyone that may not be feeling well and notify your surgeon if you develop symptoms. If you test positive for Covid or have been in contact with anyone that has tested positive in the last 10 days please notify you surgeon.    [place] - Preparing for Surgery Before surgery, you can play an important role.  Because skin is not sterile, your skin needs to be as free of germs as possible.  You can reduce the number of germs on your skin by washing with CHG (chlorahexidine gluconate) soap before surgery.  CHG is an antiseptic cleaner which kills germs and bonds with the skin to continue killing germs even after washing. Please DO NOT use if you have an allergy to CHG or antibacterial soaps.  If your skin becomes reddened/irritated stop using the CHG and inform your nurse when you arrive at Short Stay. Do not shave (including legs and underarms) for at least 48 hours prior to the first CHG shower.  You may shave your face/neck.  Please follow these instructions carefully:  1.  Shower with CHG Soap the night before surgery ONLY (DO NOT USE THE SOAP THE MORNING OF SURGERY).  2.  If you choose to wash your hair, wash your hair first as usual with your normal  shampoo.  3.  After you shampoo, rinse your hair and body thoroughly to remove the shampoo.  4.  Use CHG as you would any other liquid soap.  You can apply chg directly to the skin and wash.  Gently with a scrungie or clean washcloth.  5.  Apply the CHG Soap to your body ONLY FROM THE NECK DOWN.   Do   not use on face/ open                           Wound or open sores. Avoid contact with eyes, ears mouth and   genitals (private parts).                       Wash face,  Genitals (private parts) with your normal  soap.             6.  Wash thoroughly, paying special attention to the area where your    surgery  will be performed.  7.  Thoroughly rinse your body with warm water from the neck down.  8.  DO NOT shower/wash with your normal soap after using and rinsing off the CHG Soap.                9.  Pat yourself dry with a clean towel.            10.  Wear clean pajamas.            11.  Place clean sheets on your bed the night of your first shower and do not  sleep with pets. Day of Surgery : Do not apply any CHG, lotions/deodorants the morning of surgery.  Please wear clean clothes to the hospital/surgery center.  FAILURE TO FOLLOW THESE INSTRUCTIONS MAY RESULT IN THE CANCELLATION OF YOUR SURGERY  PATIENT SIGNATURE_________________________________  NURSE SIGNATURE__________________________________  ________________________________________________________________________

## 2024-04-04 ENCOUNTER — Encounter (HOSPITAL_COMMUNITY): Payer: Self-pay

## 2024-04-04 ENCOUNTER — Other Ambulatory Visit: Payer: Self-pay

## 2024-04-04 ENCOUNTER — Encounter (HOSPITAL_COMMUNITY)
Admission: RE | Admit: 2024-04-04 | Discharge: 2024-04-04 | Disposition: A | Discharge status: Home / Self Care | Source: Ambulatory Visit | Attending: Orthopedic Surgery | Admitting: Orthopedic Surgery

## 2024-04-04 VITALS — BP 130/54 | HR 60 | Temp 97.7°F | Resp 16 | Ht 64.0 in | Wt 358.9 lb

## 2024-04-04 DIAGNOSIS — I890 Lymphedema, not elsewhere classified: Secondary | ICD-10-CM | POA: Insufficient documentation

## 2024-04-04 DIAGNOSIS — Z9884 Bariatric surgery status: Secondary | ICD-10-CM | POA: Insufficient documentation

## 2024-04-04 DIAGNOSIS — I110 Hypertensive heart disease with heart failure: Secondary | ICD-10-CM | POA: Diagnosis not present

## 2024-04-04 DIAGNOSIS — S83282A Other tear of lateral meniscus, current injury, left knee, initial encounter: Secondary | ICD-10-CM | POA: Insufficient documentation

## 2024-04-04 DIAGNOSIS — I272 Pulmonary hypertension, unspecified: Secondary | ICD-10-CM | POA: Insufficient documentation

## 2024-04-04 DIAGNOSIS — Z01818 Encounter for other preprocedural examination: Secondary | ICD-10-CM | POA: Diagnosis present

## 2024-04-04 DIAGNOSIS — Z79899 Other long term (current) drug therapy: Secondary | ICD-10-CM | POA: Diagnosis not present

## 2024-04-04 DIAGNOSIS — G4733 Obstructive sleep apnea (adult) (pediatric): Secondary | ICD-10-CM | POA: Insufficient documentation

## 2024-04-04 DIAGNOSIS — Z01812 Encounter for preprocedural laboratory examination: Secondary | ICD-10-CM | POA: Insufficient documentation

## 2024-04-04 DIAGNOSIS — I5033 Acute on chronic diastolic (congestive) heart failure: Secondary | ICD-10-CM | POA: Insufficient documentation

## 2024-04-04 DIAGNOSIS — Z7984 Long term (current) use of oral hypoglycemic drugs: Secondary | ICD-10-CM | POA: Insufficient documentation

## 2024-04-04 DIAGNOSIS — Z87891 Personal history of nicotine dependence: Secondary | ICD-10-CM | POA: Insufficient documentation

## 2024-04-04 DIAGNOSIS — F32A Depression, unspecified: Secondary | ICD-10-CM | POA: Insufficient documentation

## 2024-04-04 DIAGNOSIS — M797 Fibromyalgia: Secondary | ICD-10-CM | POA: Diagnosis not present

## 2024-04-04 DIAGNOSIS — D649 Anemia, unspecified: Secondary | ICD-10-CM | POA: Insufficient documentation

## 2024-04-04 DIAGNOSIS — X58XXXA Exposure to other specified factors, initial encounter: Secondary | ICD-10-CM | POA: Diagnosis not present

## 2024-04-04 DIAGNOSIS — F419 Anxiety disorder, unspecified: Secondary | ICD-10-CM | POA: Insufficient documentation

## 2024-04-04 HISTORY — DX: Breakdown (mechanical) of heart valve prosthesis, initial encounter: T82.01XA

## 2024-04-04 HISTORY — DX: Unspecified osteoarthritis, unspecified site: M19.90

## 2024-04-04 HISTORY — DX: Nonrheumatic aortic (valve) stenosis: I35.0

## 2024-04-04 LAB — BASIC METABOLIC PANEL WITH GFR
Anion gap: 8 (ref 5–15)
BUN: 20 mg/dL (ref 8–23)
CO2: 27 mmol/L (ref 22–32)
Calcium: 8.9 mg/dL (ref 8.9–10.3)
Chloride: 106 mmol/L (ref 98–111)
Creatinine, Ser: 0.91 mg/dL (ref 0.44–1.00)
GFR, Estimated: >60 mL/min (ref >60)
Glucose, Bld: 84 mg/dL (ref 70–99)
Potassium: 4.8 mmol/L (ref 3.5–5.1)
Sodium: 141 mmol/L (ref 135–145)

## 2024-04-04 LAB — CBC
HCT: 33.8 % — ABNORMAL LOW (ref 36.0–46.0)
Hemoglobin: 9.7 g/dL — ABNORMAL LOW (ref 12.0–15.0)
MCH: 22.2 pg — ABNORMAL LOW (ref 26.0–34.0)
MCHC: 28.7 g/dL — ABNORMAL LOW (ref 30.0–36.0)
MCV: 77.3 fL — ABNORMAL LOW (ref 80.0–100.0)
Platelets: 373 K/uL (ref 150–400)
RBC: 4.37 MIL/uL (ref 3.87–5.11)
RDW: 16.6 % — ABNORMAL HIGH (ref 11.5–15.5)
WBC: 8 K/uL (ref 4.0–10.5)
nRBC: 0 % (ref 0.0–0.2)

## 2024-04-08 ENCOUNTER — Encounter (HOSPITAL_COMMUNITY): Payer: Self-pay

## 2024-04-08 NOTE — Progress Notes (Signed)
 Case: [id number] Date/Time: 04/11/24 1158   Procedure: ARTHROSCOPY, KNEE, WITH LATERAL MENISCECTOMY (Left: Knee) - Left knee arthroscopy with partial lateral menisectomy   Anesthesia type: Choice   Pre-op diagnosis: Left knee lateral meniscus tear   Location: [facility] ROOM 07 / [facility]   Surgeons: Sharl Selinda Dover, MD       DISCUSSION: Kimberly Castro is a 63 yo female with PMH of former smoking, HTN, HFpEF, lymphedema, pulmonary HTN, OSA (uses CPAP), s/p gastric bypass, anemia, hx of bladder stimulator, fibromyalgia, anxiety, depression.  Prior anesthesia complications include hypotension with colonoscopy. She had a lap chole in 12/2023 for gallstone pancreatitis which was uncomplicated.  Patient follows with cardiology for history of HFpEF, lymphedema, HTN. Last seen by Dr. Raford on 03/13/24 for pre op risk assessment. Pt reported significant LE edema. She is waiting on leg pumps. She was started on Farxiga  for her CHF. Cleared for surgery:  Pre-operative risk:  No ischemic symptoms.  Her LV is normal on echo and her only limitation is her knee.  She is at acceptable risk for surgery.   Hx of anemia which was mild in 12/2023. Hgb 11.8 on 7/11 and dropped to 9.1 on discharge. Hgb 9.7 on pre op labs. Discussed with patient. She had a f/u visit with her PCP on 7/21. She was advised to continue iron and B12 injections. Patient reports chronic fatigue but otherwise denies any acute blood loss. She is not on blood thinners. She was advised she would be ok to proceed with surgery however she should f/u with PCP regarding persistent moderate anemia.   VS: BP (!) 130/54   Pulse 60   Temp 36.5 C (Oral)   Resp 16   Ht 5' 4 (1.626 m)   Wt (!) 162.8 kg   SpO2 100%   BMI 61.61 kg/m   PROVIDERS: Corlis Pagan, NP   LABS: Labs reviewed: Acceptable for surgery. (all labs ordered are listed, but only abnormal results are displayed)  Labs Reviewed  CBC - Abnormal; Notable for the following  components:      Result Value   Hemoglobin 9.7 (*)    HCT 33.8 (*)    MCV 77.3 (*)    MCH 22.2 (*)    MCHC 28.7 (*)    RDW 16.6 (*)    All other components within normal limits  BASIC METABOLIC PANEL WITH GFR     Echo 12/31/23:  IMPRESSIONS    1. Left ventricular ejection fraction, by estimation, is 55 to 60%. The left ventricle has normal function. The left ventricle has no regional wall motion abnormalities. There is mild concentric left ventricular hypertrophy. Left ventricular diastolic parameters are consistent with Grade I diastolic dysfunction (impaired relaxation). Elevated left ventricular end-diastolic pressure.  2. Right ventricular systolic function is mildly reduced. The right ventricular size is normal. There is moderately elevated pulmonary artery systolic pressure.  3. Left atrial size was severely dilated.  4. The mitral valve is normal in structure. Trivial mitral valve regurgitation. No evidence of mitral stenosis.  5. The aortic valve is tricuspid. There is mild calcification of the aortic valve. There is mild thickening of the aortic valve. Aortic valve regurgitation is trivial. Aortic valve sclerosis/calcification is present, without any evidence of aortic stenosis. Aortic valve area, by VTI measures 2.23 cm. Aortic valve mean gradient measures 7.0 mmHg. Aortic valve Vmax measures 1.87 m/s.  6. The inferior vena cava is dilated in size with <50% respiratory variability, suggesting right atrial pressure of 15  mmHg.  Stress test 05/28/2019:  Nuclear stress EF: 60%. The study is normal. This is a low risk study. There was no ST segment deviation noted during stress. No T wave inversion was noted during stress.   Low risk stress nuclear study with normal perfusion and normal left ventricular regional and global systolic function.    Past Medical History:  Diagnosis Date   (HFpEF) heart failure with preserved ejection fraction (HCC) 03/13/2024   ADD  (attention deficit disorder)    Anemia    Anxiety    Aortic atherosclerosis 03/09/2023   Arthritis    B12 deficiency    Back pain    CHF (congestive heart failure) (HCC)    Chronic fatigue    CKD (chronic kidney disease), stage III (HCC)    Complication of anesthesia    during colonoscopy BP dropped very low   Constipation    Depression    Depression    Fatigue 06/10/2020   Fatty liver    Fibromyalgia    Headache    Heart murmur    no problems with it   Heart valve malfunction    Hypercholesteremia    Hypertension    Hypoglycemia    IBS (irritable bowel syndrome)    IBS (irritable bowel syndrome)    Osteopenia    Palpitations 03/31/2016   anxiety related   Shortness of breath 03/09/2023   Sleep apnea    uses cpap   SOB (shortness of breath)    Statin intolerance 03/09/2023    Past Surgical History:  Procedure Laterality Date   ANTERIOR AND POSTERIOR REPAIR N/A 06/23/2019   Procedure: ANTERIOR (CYSTOCELE) AND POSTERIOR REPAIR (RECTOCELE);  Surgeon: Henry Slough, MD;  Location: [facility] OR;  Service: Gynecology;  Laterality: N/A;  3 hours   bladder stimulator  2022   BLADDER SUSPENSION N/A 06/23/2019   Procedure: TRANSVAGINAL TAPE (TVT) PROCEDURE;  Surgeon: Henry Slough, MD;  Location: [facility] OR;  Service: Gynecology;  Laterality: N/A;   CHOLECYSTECTOMY N/A 12/31/2023   Procedure: LAPAROSCOPIC CHOLECYSTECTOMY;  Surgeon: Lyndel Deward PARAS, MD;  Location: [facility];  Service: General;  Laterality: N/A;   COLONOSCOPY     GASTRIC BYPASS     LAPAROSCOPY N/A 08/12/2016   Procedure: LAPAROSCOPY DIAGNOSTIC, POSSIBLE LAPAROTOMY, POSSIBLE BOWEL RESECTION;  Surgeon: Morene Olives, MD;  Location: [facility];  Service: General;  Laterality: N/A;   PARTIAL HYSTERECTOMY     still has ovaries   TONSILLECTOMY     WISDOM TOOTH EXTRACTION      MEDICATIONS:  acetaminophen  (TYLENOL ) 500 MG tablet   amphetamine -dextroamphetamine  (ADDERALL XR) 30 MG 24 hr capsule   buPROPion  (WELLBUTRIN   XL) 300 MG 24 hr tablet   cetirizine (ZYRTEC) 10 MG tablet   Cholecalciferol  125 MCG (5000 UT) capsule   cyanocobalamin  (VITAMIN B12) 1000 MCG/ML injection   dapagliflozin  propanediol (FARXIGA ) 10 MG TABS tablet   denosumab (PROLIA) 60 MG/ML SOSY injection   ezetimibe  (ZETIA ) 10 MG tablet   fenoprofen (NALFON) 600 MG TABS tablet   ferrous sulfate 325 (65 FE) MG tablet   fluticasone  (FLONASE ) 50 MCG/ACT nasal spray   gabapentin (NEURONTIN) 400 MG capsule   LYRICA  150 MG capsule   metoprolol  tartrate (LOPRESSOR ) 25 MG tablet   ondansetron  (ZOFRAN ) 4 MG tablet   QULIPTA  60 MG TABS   REPATHA  SURECLICK 140 MG/ML SOAJ   spironolactone  (ALDACTONE ) 25 MG tablet   traMADol (ULTRAM) 50 MG tablet   valsartan  (DIOVAN ) 160 MG tablet   Venlafaxine  HCl 225 MG TB24  zolpidem  (AMBIEN ) 10 MG tablet   No current facility-administered medications for this encounter.    Kimberly Castro/[facility] Short Stay/Anesthesiology [facility] Phone [phone] 04/08/2024 10:55 AM

## 2024-04-11 ENCOUNTER — Ambulatory Visit (HOSPITAL_COMMUNITY): Admitting: Anesthesiology

## 2024-04-11 ENCOUNTER — Ambulatory Visit (HOSPITAL_COMMUNITY): Payer: Self-pay | Admitting: Physician Assistant

## 2024-04-11 ENCOUNTER — Encounter (HOSPITAL_COMMUNITY): Payer: Self-pay | Admitting: Orthopedic Surgery

## 2024-04-11 ENCOUNTER — Encounter (HOSPITAL_COMMUNITY)
Admission: RE | Disposition: A | Discharge status: Home / Self Care | Payer: Self-pay | Source: Home / Self Care | Attending: Orthopedic Surgery

## 2024-04-11 ENCOUNTER — Other Ambulatory Visit (HOSPITAL_COMMUNITY): Payer: Self-pay

## 2024-04-11 ENCOUNTER — Ambulatory Visit (HOSPITAL_COMMUNITY)
Admission: RE | Admit: 2024-04-11 | Discharge: 2024-04-11 | Disposition: A | Discharge status: Home / Self Care | Attending: Orthopedic Surgery | Admitting: Orthopedic Surgery

## 2024-04-11 DIAGNOSIS — Z87891 Personal history of nicotine dependence: Secondary | ICD-10-CM

## 2024-04-11 DIAGNOSIS — F418 Other specified anxiety disorders: Secondary | ICD-10-CM | POA: Diagnosis not present

## 2024-04-11 DIAGNOSIS — X58XXXA Exposure to other specified factors, initial encounter: Secondary | ICD-10-CM | POA: Insufficient documentation

## 2024-04-11 DIAGNOSIS — F32A Depression, unspecified: Secondary | ICD-10-CM | POA: Insufficient documentation

## 2024-04-11 DIAGNOSIS — G4730 Sleep apnea, unspecified: Secondary | ICD-10-CM | POA: Insufficient documentation

## 2024-04-11 DIAGNOSIS — M1990 Unspecified osteoarthritis, unspecified site: Secondary | ICD-10-CM | POA: Insufficient documentation

## 2024-04-11 DIAGNOSIS — M2242 Chondromalacia patellae, left knee: Secondary | ICD-10-CM | POA: Insufficient documentation

## 2024-04-11 DIAGNOSIS — M6598 Unspecified synovitis and tenosynovitis, other site: Secondary | ICD-10-CM | POA: Diagnosis not present

## 2024-04-11 DIAGNOSIS — F419 Anxiety disorder, unspecified: Secondary | ICD-10-CM | POA: Insufficient documentation

## 2024-04-11 DIAGNOSIS — I130 Hypertensive heart and chronic kidney disease with heart failure and stage 1 through stage 4 chronic kidney disease, or unspecified chronic kidney disease: Secondary | ICD-10-CM | POA: Insufficient documentation

## 2024-04-11 DIAGNOSIS — S83282A Other tear of lateral meniscus, current injury, left knee, initial encounter: Secondary | ICD-10-CM | POA: Diagnosis present

## 2024-04-11 DIAGNOSIS — M797 Fibromyalgia: Secondary | ICD-10-CM | POA: Insufficient documentation

## 2024-04-11 DIAGNOSIS — D649 Anemia, unspecified: Secondary | ICD-10-CM | POA: Insufficient documentation

## 2024-04-11 DIAGNOSIS — R519 Headache, unspecified: Secondary | ICD-10-CM | POA: Diagnosis not present

## 2024-04-11 DIAGNOSIS — Z79899 Other long term (current) drug therapy: Secondary | ICD-10-CM | POA: Diagnosis not present

## 2024-04-11 DIAGNOSIS — I10 Essential (primary) hypertension: Secondary | ICD-10-CM

## 2024-04-11 DIAGNOSIS — I5032 Chronic diastolic (congestive) heart failure: Secondary | ICD-10-CM | POA: Diagnosis not present

## 2024-04-11 DIAGNOSIS — M94262 Chondromalacia, left knee: Secondary | ICD-10-CM | POA: Insufficient documentation

## 2024-04-11 DIAGNOSIS — N183 Chronic kidney disease, stage 3 unspecified: Secondary | ICD-10-CM | POA: Diagnosis not present

## 2024-04-11 HISTORY — PX: KNEE ARTHROSCOPY WITH LATERAL MENISECTOMY: SHX6193

## 2024-04-11 SURGERY — ARTHROSCOPY, KNEE, WITH LATERAL MENISCECTOMY
Anesthesia: General | Site: Knee | Laterality: Left

## 2024-04-11 MED ORDER — ROCURONIUM BROMIDE 10 MG/ML (PF) SYRINGE
PREFILLED_SYRINGE | INTRAVENOUS | Status: AC
  Filled 2024-04-11: qty 10

## 2024-04-11 MED ORDER — MIDAZOLAM HCL 5 MG/5ML IJ SOLN
INTRAMUSCULAR | Status: DC | PRN
  Administered 2024-04-11: 1 mg via INTRAVENOUS

## 2024-04-11 MED ORDER — ACETAMINOPHEN 10 MG/ML IV SOLN
1000.0000 mg | Freq: Once | INTRAVENOUS | Status: DC | PRN
  Administered 2024-04-11: 1000 mg via INTRAVENOUS

## 2024-04-11 MED ORDER — CHLORHEXIDINE GLUCONATE 0.12 % MT SOLN
15.0000 mL | Freq: Once | OROMUCOSAL | Status: AC
  Administered 2024-04-11: 15 mL via OROMUCOSAL

## 2024-04-11 MED ORDER — FENTANYL CITRATE (PF) 50 MCG/ML IJ SOSY
50.0000 ug | PREFILLED_SYRINGE | Freq: Once | INTRAMUSCULAR | Status: DC
Start: 2024-04-11 — End: 2024-04-11

## 2024-04-11 MED ORDER — KETOROLAC TROMETHAMINE 30 MG/ML IJ SOLN
INTRAMUSCULAR | Status: AC
  Filled 2024-04-11: qty 1

## 2024-04-11 MED ORDER — LIDOCAINE 2% (20 MG/ML) 5 ML SYRINGE
INTRAMUSCULAR | Status: DC | PRN
  Administered 2024-04-11: 100 mg via INTRAVENOUS

## 2024-04-11 MED ORDER — ORAL CARE MOUTH RINSE: 15.0000 mL | Freq: Once | OROMUCOSAL | Status: AC

## 2024-04-11 MED ORDER — FENTANYL CITRATE (PF) 50 MCG/ML IJ SOSY
PREFILLED_SYRINGE | INTRAMUSCULAR | Status: AC
  Filled 2024-04-11: qty 1

## 2024-04-11 MED ORDER — BUPIVACAINE HCL 0.25 % IJ SOLN
INTRAMUSCULAR | Status: DC | PRN
  Administered 2024-04-11: 20 mL

## 2024-04-11 MED ORDER — SUGAMMADEX SODIUM 200 MG/2ML IV SOLN
INTRAVENOUS | Status: DC | PRN
  Administered 2024-04-11: 400 mg via INTRAVENOUS

## 2024-04-11 MED ORDER — LIDOCAINE HCL (PF) 2 % IJ SOLN
INTRAMUSCULAR | Status: AC
  Filled 2024-04-11: qty 5

## 2024-04-11 MED ORDER — FENTANYL CITRATE (PF) 50 MCG/ML IJ SOSY
PREFILLED_SYRINGE | INTRAMUSCULAR | Status: AC
  Filled 2024-04-11: qty 2

## 2024-04-11 MED ORDER — ACETAMINOPHEN 10 MG/ML IV SOLN
INTRAVENOUS | Status: AC
  Filled 2024-04-11: qty 100

## 2024-04-11 MED ORDER — DEXAMETHASONE SOD PHOSPHATE PF 10 MG/ML IJ SOLN
INTRAMUSCULAR | Status: DC | PRN
  Administered 2024-04-11: 10 mg via INTRAVENOUS

## 2024-04-11 MED ORDER — BUPIVACAINE HCL (PF) 0.25 % IJ SOLN
INTRAMUSCULAR | Status: AC
  Filled 2024-04-11: qty 30

## 2024-04-11 MED ORDER — KETOROLAC TROMETHAMINE 30 MG/ML IJ SOLN
INTRAMUSCULAR | Status: DC | PRN
  Administered 2024-04-11: 30 mg via INTRAVENOUS

## 2024-04-11 MED ORDER — FENTANYL CITRATE (PF) 100 MCG/2ML IJ SOLN
INTRAMUSCULAR | Status: AC
  Filled 2024-04-11: qty 2

## 2024-04-11 MED ORDER — ROCURONIUM BROMIDE 10 MG/ML (PF) SYRINGE
PREFILLED_SYRINGE | INTRAVENOUS | Status: DC | PRN
  Administered 2024-04-11: 60 mg via INTRAVENOUS

## 2024-04-11 MED ORDER — ONDANSETRON HCL 4 MG/2ML IJ SOLN
INTRAMUSCULAR | Status: AC
Start: 2024-04-11 — End: 2024-04-11
  Filled 2024-04-11: qty 2

## 2024-04-11 MED ORDER — FENTANYL CITRATE (PF) 100 MCG/2ML IJ SOLN
INTRAMUSCULAR | Status: DC | PRN
  Administered 2024-04-11: 100 ug via INTRAVENOUS

## 2024-04-11 MED ORDER — PROPOFOL 10 MG/ML IV BOLUS
INTRAVENOUS | Status: AC
  Filled 2024-04-11: qty 20

## 2024-04-11 MED ORDER — CEFAZOLIN SODIUM-DEXTROSE 3-4 GM/150ML-% IV SOLN
3.0000 g | INTRAVENOUS | Status: AC
  Administered 2024-04-11: 2 g via INTRAVENOUS
  Filled 2024-04-11: qty 150

## 2024-04-11 MED ORDER — FENTANYL CITRATE (PF) 50 MCG/ML IJ SOSY
25.0000 ug | PREFILLED_SYRINGE | INTRAMUSCULAR | Status: DC | PRN
  Administered 2024-04-11 (×2): 50 ug via INTRAVENOUS

## 2024-04-11 MED ORDER — DROPERIDOL 2.5 MG/ML IJ SOLN: 0.6250 mg | Freq: Once | INTRAMUSCULAR | Status: DC | PRN

## 2024-04-11 MED ORDER — LACTATED RINGERS IV SOLN: INTRAVENOUS | Status: DC

## 2024-04-11 MED ORDER — ONDANSETRON 4 MG PO TBDP
4.0000 mg | ORAL_TABLET | Freq: Three times a day (TID) | ORAL | 0 refills | Status: DC | PRN
  Filled 2024-04-11: qty 20, 7d supply, fill #0

## 2024-04-11 MED ORDER — HYDROCODONE-ACETAMINOPHEN 7.5-325 MG PO TABS
1.0000 | ORAL_TABLET | Freq: Four times a day (QID) | ORAL | 0 refills | Status: AC | PRN
  Filled 2024-04-11: qty 22, 6d supply, fill #0

## 2024-04-11 MED ORDER — MIDAZOLAM HCL (PF) 2 MG/2ML IJ SOLN
1.0000 mg | Freq: Once | INTRAMUSCULAR | Status: DC
Start: 2024-04-11 — End: 2024-04-11

## 2024-04-11 MED ORDER — SUGAMMADEX SODIUM 200 MG/2ML IV SOLN
INTRAVENOUS | Status: AC
Start: 2024-04-11 — End: 2024-04-11
  Filled 2024-04-11: qty 2

## 2024-04-11 MED ORDER — ONDANSETRON HCL 4 MG/2ML IJ SOLN
INTRAMUSCULAR | Status: DC | PRN
  Administered 2024-04-11: 4 mg via INTRAVENOUS

## 2024-04-11 MED ORDER — SUGAMMADEX SODIUM 200 MG/2ML IV SOLN
INTRAVENOUS | Status: AC
  Filled 2024-04-11: qty 2

## 2024-04-11 MED ORDER — MIDAZOLAM HCL 2 MG/2ML IJ SOLN
INTRAMUSCULAR | Status: AC
  Filled 2024-04-11: qty 2

## 2024-04-11 MED ORDER — PROPOFOL 10 MG/ML IV BOLUS
INTRAVENOUS | Status: DC | PRN
  Administered 2024-04-11: 30 mg via INTRAVENOUS
  Administered 2024-04-11: 150 mg via INTRAVENOUS

## 2024-04-11 MED ORDER — SODIUM CHLORIDE 0.9 % IR SOLN
Status: DC | PRN
  Administered 2024-04-11: 3000 mL

## 2024-04-11 SURGICAL SUPPLY — 31 items
BAG COUNTER SPONGE SURGICOUNT (BAG) ×1 IMPLANT
BLADE SHAVER TORPEDO 4X13 (MISCELLANEOUS) ×1 IMPLANT
BNDG ELASTIC 6INX 5YD STR LF (GAUZE/BANDAGES/DRESSINGS) ×1 IMPLANT
COVER SURGICAL LIGHT HANDLE (MISCELLANEOUS) ×1 IMPLANT
CUFF TRNQT CYL 34X4.125X (TOURNIQUET CUFF) IMPLANT
DRAPE ARTHROSCOPY W/POUCH 114 (DRAPES) ×1 IMPLANT
DRAPE SHEET LG 3/4 BI-LAMINATE (DRAPES) IMPLANT
DRAPE U-SHAPE 47X51 STRL (DRAPES) ×1 IMPLANT
DURAPREP 26ML APPLICATOR (WOUND CARE) ×1 IMPLANT
ELECT PENCIL ROCKER SW 15FT (MISCELLANEOUS) ×1 IMPLANT
GAUZE 4X4 16PLY ~~LOC~~+RFID DBL (SPONGE) ×1 IMPLANT
GAUZE PAD ABD 8X10 STRL (GAUZE/BANDAGES/DRESSINGS) ×2 IMPLANT
GAUZE SPONGE 4X4 12PLY STRL (GAUZE/BANDAGES/DRESSINGS) ×1 IMPLANT
GLOVE BIO SURGEON STRL SZ7.5 (GLOVE) ×2 IMPLANT
GLOVE BIOGEL PI IND STRL 8 (GLOVE) ×2 IMPLANT
GOWN STRL REUS W/ TWL XL LVL3 (GOWN DISPOSABLE) ×2 IMPLANT
IV NS IRRIG 3000ML ARTHROMATIC (IV SOLUTION) ×2 IMPLANT
KIT BASIN OR (CUSTOM PROCEDURE TRAY) ×1 IMPLANT
KIT TURNOVER KIT A (KITS) ×1 IMPLANT
MANIFOLD NEPTUNE II (INSTRUMENTS) ×1 IMPLANT
PACK ARTHROSCOPY WL (CUSTOM PROCEDURE TRAY) ×1 IMPLANT
PADDING CAST COTTON 6X4 STRL (CAST SUPPLIES) IMPLANT
STRIP CLOSURE SKIN 1/2X4 (GAUZE/BANDAGES/DRESSINGS) ×1 IMPLANT
SUT ETHILON 4 0 PS 2 18 (SUTURE) IMPLANT
SUT MNCRL AB 3-0 PS2 18 (SUTURE) ×1 IMPLANT
TOWEL OR 17X26 10 PK STRL BLUE (TOWEL DISPOSABLE) ×1 IMPLANT
TUBING ARTHROSCOPY IRRIG 16FT (MISCELLANEOUS) ×1 IMPLANT
WAND ABLATOR APOLLO I90 (BUR) IMPLANT
WAND APOLLORF SJ50 AR-9845 (SURGICAL WAND) IMPLANT
WATER STERILE IRR 500ML POUR (IV SOLUTION) ×1 IMPLANT
WRAP KNEE MAXI GEL POST OP (GAUZE/BANDAGES/DRESSINGS) ×1 IMPLANT

## 2024-04-11 NOTE — Transfer of Care (Signed)
 Immediate Anesthesia Transfer of Care Note  Patient: Kimberly Castro  Procedure(s) Performed: ARTHROSCOPY, KNEE, WITH LATERAL MENISCECTOMY (Left: Knee)  Patient Location: PACU  Anesthesia Type:General  Level of Consciousness: awake and oriented  Airway & Oxygen Therapy: Patient Spontanous Breathing and Patient connected to face mask  Post-op Assessment: Report given to RN and Post -op Vital signs reviewed and stable  Post vital signs: Reviewed and stable  Last Vitals:  Vitals Value Taken Time  BP 138/60 04/11/24 13:45  Temp    Pulse 60 04/11/24 13:48  Resp 16 04/11/24 13:48  SpO2 95 % 04/11/24 13:48  Vitals shown include unfiled device data.  Last Pain:  Vitals:   04/11/24 1342  TempSrc:   PainSc: 7          Complications: No notable events documented.

## 2024-04-11 NOTE — Anesthesia Preprocedure Evaluation (Signed)
 Anesthesia Evaluation  Patient identified by MRN, date of birth, ID band Patient awake    Reviewed: Allergy & Precautions, NPO status , Patient's Chart, lab work & pertinent test results  Airway Mallampati: II  TM Distance: >3 FB Neck ROM: Full    Dental no notable dental hx.    Pulmonary sleep apnea and Continuous Positive Airway Pressure Ventilation , former smoker   Pulmonary exam normal        Cardiovascular hypertension, Pt. on medications and Pt. on home beta blockers  Rhythm:Regular Rate:Normal  ECHO 07/25: IMPRESSIONS    1. Left ventricular ejection fraction, by estimation, is 55 to 60%. The left ventricle has normal function. The left ventricle has no regional wall motion abnormalities. There is mild concentric left ventricular hypertrophy. Left ventricular diastolic parameters are consistent with Grade I diastolic dysfunction (impaired relaxation). Elevated left ventricular end-diastolic pressure.  2. Right ventricular systolic function is mildly reduced. The right ventricular size is normal. There is moderately elevated pulmonary artery systolic pressure.  3. Left atrial size was severely dilated.  4. The mitral valve is normal in structure. Trivial mitral valve regurgitation. No evidence of mitral stenosis.  5. The aortic valve is tricuspid. There is mild calcification of the aortic valve. There is mild thickening of the aortic valve. Aortic valve regurgitation is trivial. Aortic valve sclerosis/calcification is present, without any evidence of aortic stenosis. Aortic valve area, by VTI measures 2.23 cm. Aortic valve mean gradient measures 7.0 mmHg. Aortic valve Vmax measures 1.87 m/s.  6. The inferior vena cava is dilated in size with <50% respiratory variability, suggesting right atrial pressure of 15 mmHg.    Neuro/Psych  Headaches  Anxiety Depression       GI/Hepatic negative GI ROS, Neg liver ROS,,,  Endo/Other   negative endocrine ROS    Renal/GU CRF     Musculoskeletal  (+) Arthritis ,  Fibromyalgia -  Abdominal Normal abdominal exam  (+)   Peds  Hematology  (+) Blood dyscrasia, anemia   Anesthesia Other Findings   Reproductive/Obstetrics                              Anesthesia Physical Anesthesia Plan  ASA: 3  Anesthesia Plan: General   Post-op Pain Management:    Induction: Intravenous  PONV Risk Score and Plan: 3 and Ondansetron , Dexamethasone  and Midazolam   Airway Management Planned: Mask and LMA  Additional Equipment: None  Intra-op Plan:   Post-operative Plan: Extubation in OR  Informed Consent: I have reviewed the patients History and Physical, chart, labs and discussed the procedure including the risks, benefits and alternatives for the proposed anesthesia with the patient or authorized representative who has indicated his/her understanding and acceptance.     Dental advisory given  Plan Discussed with: CRNA  Anesthesia Plan Comments:         Anesthesia Quick Evaluation

## 2024-04-11 NOTE — Op Note (Signed)
 Surgery Date: 04/11/2024  Surgeon(s): Sharl Selinda [facility], MD  ANESTHESIA:  general, local with quarter percent plain Marcaine   FLUIDS: Per anesthesia record.   ESTIMATED BLOOD LOSS: minimal  PREOPERATIVE DIAGNOSES:  1.  Left knee lateral meniscus tear 2.  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  POSTOPERATIVE DIAGNOSES:  same  PROCEDURES PERFORMED:  1.  Left knee arthroscopy with limited synovectomy 2.  Left knee arthroscopy with arthroscopic partial lateral meniscectomy 3.  Left knee arthroscopy with arthroscopic chondroplasty medial femoral condyle and medial tibial plateau, and trochlea  DESCRIPTION OF PROCEDURE: [name] is a 63 y.o.-year-old female with left knee unstable lateral meniscus tear. Plans are to proceed with partial lateral meniscectomy and diagnostic arthroscopy with debridement as indicated. Full discussion held regarding risks benefits alternatives and complications related surgical intervention. Conservative care options reviewed. All questions answered.  The patient was identified in the preoperative holding area and the operative extremity was marked. The patient was brought to the operating room and transferred to operating table in a supine position. Satisfactory general anesthesia was induced by anesthesiology.    Standard anterolateral, anteromedial arthroscopy portals were obtained. The anteromedial portal was obtained with a spinal needle for localization under direct visualization with subsequent diagnostic findings.   Anteromedial and anterolateral chambers: moderate synovitis. The synovitis was debrided with a 4.5 mm full radius shaver through both the anteromedial and lateral portals.   Suprapatellar pouch and gutters: mild synovitis or debris. Patella chondral surface: Grade 2 Trochlear chondral surface: Grade 3 Patellofemoral tracking: Midline and level Medial meniscus: Intact.   Medial femoral condyle flexion bearing surface: Grade 2 Medial femoral condyle extension bearing surface: Grade 3 Medial tibial plateau: Grade 2 Anterior cruciate ligament:stable Posterior cruciate ligament:stable Lateral meniscus: Horizontal tear with undersurface flap fragment adjacent to the popliteal hiatus.  Horizontal tearing of the anterior horn as well..   Lateral femoral condyle flexion bearing surface: Grade 0 Lateral femoral condyle extension bearing surface: Grade 0 Lateral tibial plateau: Grade 1  Partial lateral meniscectomy was carried out with motorized shaver.  The unstable inferior flap fragment was debrided with motorized shaver back to stable border.  Likewise the undersurface of the anterior horn horizontal tear was debrided with motorized shaver.  After completion of synovectomy, diagnostic exam, and debridements as described, all compartments were checked and no residual debris remained. Hemostasis was achieved with the cautery wand. The portals were approximated with buried monocryl. All excess fluid was expressed from the joint.  Xeroform sterile gauze dressings were applied followed by Ace bandage and ice pack.   DISPOSITION: The patient was awakened from general anesthetic, extubated, taken to the recovery room in medically stable condition, no apparent complications. The patient may be weightbearing as tolerated to the operative lower extremity.  Range of motion of left knee as tolerated.

## 2024-04-11 NOTE — Discharge Instructions (Addendum)

## 2024-04-11 NOTE — Brief Op Note (Signed)
 04/11/2024  1:28 PM  PATIENT:  Kimberly Castro  63 y.o. female  PRE-OPERATIVE DIAGNOSIS:  Left knee lateral meniscus tear  POST-OPERATIVE DIAGNOSIS:  Left knee lateral meniscus tear  PROCEDURE:  Procedure(s) with comments: ARTHROSCOPY, KNEE, WITH LATERAL MENISCECTOMY (Left) - Left knee arthroscopy with partial lateral menisectomy  SURGEON:  Surgeons and Role:    * Sharl Selinda Dover, MD - Primary  PHYSICIAN ASSISTANT: Dayle Moores, PA-C   ANESTHESIA:   local and general  EBL: 10 cc  BLOOD ADMINISTERED:none  DRAINS: none   LOCAL MEDICATIONS USED:  MARCAINE      SPECIMEN:  No Specimen  DISPOSITION OF SPECIMEN:  N/A  COUNTS:  YES  TOURNIQUET:  * No tourniquets in log *  DICTATION: .Note written in EPIC  PLAN OF CARE: Discharge to home after PACU  PATIENT DISPOSITION:  PACU - hemodynamically stable.   Delay start of Pharmacological VTE agent (>24hrs) due to surgical blood loss or risk of bleeding: not applicable

## 2024-04-11 NOTE — Evaluation (Signed)
 Physical Therapy Evaluation Patient Details Name: Kimberly Castro MRN: [id number] DOB: 1960/12/20 Today's Date: 04/11/2024  History of Present Illness  63 yo female presents to therapy s/p L knee arthroscopy secondary to unstable L knee and pain due to meniscus tear. Pt underwent partial lateral meniscectomy and debridement as well as chondroplasty of medial femoral condyle, tibial plateau and trochlea. Pt is currently L LE WBAT and ROM as tolerated. Pt PMH includes but is not limited to: HFpEF, ADD, anemia, anxiety, atherosclerosis, chronic fatigue, LBP, CKD, depression, fibromyalgia, HLD, HTN, OSA on CPAP, OP, IBS, and SOB.  Clinical Impression    Kimberly Castro is a 63 y.o. female POD 0 s/p L knee arthroscopy. Patient reports mod I with mobility at baseline. Patient is now limited by functional impairments (see PT problem list below) and requires CGA for transfers. Patient was able to ambulate 35 feet with RW and CGA level of assist. Step navigation with B handrails and progressing to R handrail only with SPC cues for safety, sequencing and step to technique. Patient instructed in exercise to facilitate ROM and circulation to manage edema reviewed and HO provided.  Patient will benefit from continued skilled PT interventions to address impairments and progress towards PLOF. Acute PT will follow to progress mobility if pt is to remain in hospital, pt is appropriate for safe discharge home at this time from PT standpoint with family assist and OPPT services scheduled for Monday 10/27.       If plan is discharge home, recommend the following: A little help with walking and/or transfers;A little help with bathing/dressing/bathroom;Assistance with cooking/housework;Assist for transportation;Help with stairs or ramp for entrance   Can travel by private vehicle        Equipment Recommendations Rolling walker (2 wheels)  Recommendations for Other Services       Functional Status  Assessment Patient has had a recent decline in their functional status and demonstrates the ability to make significant improvements in function in a reasonable and predictable amount of time.     Precautions / Restrictions Precautions Precautions: Fall Restrictions Weight Bearing Restrictions Per Provider Order: Yes LLE Weight Bearing Per Provider Order: Weight bearing as tolerated Other Position/Activity Restrictions: ROM as tolerated      Mobility  Bed Mobility               General bed mobility comments: pt seated in recliner when PT arrived    Transfers Overall transfer level: Needs assistance Equipment used: Rolling walker (2 wheels) Transfers: Sit to/from Stand, Bed to chair/wheelchair/BSC Sit to Stand: Contact guard assist Stand pivot transfers: Contact guard assist         General transfer comment: min cues    Ambulation/Gait Ambulation/Gait assistance: Contact guard assist Gait Distance (Feet): 35 Feet Assistive device: Rolling walker (2 wheels) Gait Pattern/deviations: Step-to pattern, Decreased stance time - left, Trunk flexed, Wide base of support Gait velocity: decreased     General Gait Details: min cues for safety, sequencing and posture  Stairs Stairs: Yes Stairs assistance: Contact guard assist Stair Management: Two rails, One rail Right, With cane Number of Stairs: 2 General stair comments: step navigation with B handrails and progressing to R handrail and SPC with cues for sequencing, safety and technique  Wheelchair Mobility     Tilt Bed    Modified Rankin (Stroke Patients Only)       Balance Overall balance assessment: Needs assistance, History of Falls (hx of falls most recent in June, 11/24 with rib  fx) Sitting-balance support: Feet supported Sitting balance-Leahy Scale: Good     Standing balance support: Bilateral upper extremity supported, During functional activity, Reliant on assistive device for balance Standing  balance-Leahy Scale: Fair Standing balance comment: static standing no UE support with CGA no overt LOB                             Pertinent Vitals/Pain Pain Assessment Pain Assessment: 0-10 Pain Score: 6  Pain Location: L knee and R knee Pain Descriptors / Indicators: Aching, Constant, Discomfort, Dull, Grimacing, Operative site guarding Pain Intervention(s): Limited activity within patient's tolerance, Monitored during session, Premedicated before session, Patient requesting pain meds-RN notified, Ice applied    Home Living Family/patient expects to be discharged to:: Private residence Living Arrangements: Spouse/significant other Available Help at Discharge: Family Type of Home: House Home Access: Stairs to enter Entrance Stairs-Rails: Right Entrance Stairs-Number of Steps: 4 Alternate Level Stairs-Number of Steps: chair lift to B and B Home Layout: Multi-level;Bed/bath upstairs;Laundry or work area in [employer] Equipment: Rollator (4 wheels);BSC/3in1;Cane - single point;Shower seat      Prior Function Prior Level of Function : Independent/Modified Independent;Driving;Working/employed             Mobility Comments: Mod I with rollator in community, furniture walks in home, mod I for all ADLs, self care tasks ADLs Comments: pt states she has been very limited over the past 3 months and has been going to work and then back to the house     Extremity/Trunk Assessment        Lower Extremity Assessment Lower Extremity Assessment: LLE deficits/detail LLE Deficits / Details: ankle DF/PF 4/5 LLE Sensation: WNL    Cervical / Trunk Assessment Cervical / Trunk Assessment: Normal  Communication   Communication Communication: No apparent difficulties    Cognition Arousal: Alert Behavior During Therapy: WFL for tasks assessed/performed   PT - Cognitive impairments: No apparent impairments                         Following commands: Intact        Cueing       General Comments      Exercises     Assessment/Plan    PT Assessment Patient needs continued PT services  PT Problem List Decreased strength;Decreased range of motion;Decreased activity tolerance;Decreased balance;Decreased mobility;Decreased coordination;Pain       PT Treatment Interventions DME instruction;Gait training;Stair training;Functional mobility training;Therapeutic activities;Therapeutic exercise;Balance training;Neuromuscular re-education;Patient/family education;Modalities    PT Goals (Current goals can be found in the Care Plan section)  Acute Rehab PT Goals Patient Stated Goal: to be able to the grocery store and walk PT Goal Formulation: With patient Time For Goal Achievement: 04/25/24 Potential to Achieve Goals: Good    Frequency 7X/week     Co-evaluation               AM-PAC PT 6 Clicks Mobility  Outcome Measure Help needed turning from your back to your side while in a flat bed without using bedrails?: None Help needed moving from lying on your back to sitting on the side of a flat bed without using bedrails?: A Little Help needed moving to and from a bed to a chair (including a wheelchair)?: A Little Help needed standing up from a chair using your arms (e.g., wheelchair or bedside chair)?: A Little Help needed to walk in hospital room?: A Little Help needed  climbing 3-5 steps with a railing? : A Little 6 Click Score: 19    End of Session Equipment Utilized During Treatment: Gait belt Activity Tolerance: No increased pain;Patient tolerated treatment well Patient left: in chair;with call bell/phone within reach;with family/visitor present Nurse Communication: Mobility status;Other (comment) (pt readiness from PT standpoing for same day d/c) PT Visit Diagnosis: Other abnormalities of gait and mobility (R26.89);Unsteadiness on feet (R26.81);Muscle weakness (generalized) (M62.81);History of falling (Z91.81);Difficulty in  walking, not elsewhere classified (R26.2);Pain Pain - Right/Left:  (B) Pain - part of body: Knee;Leg    Time: 8461-8377 PT Time Calculation (min) (ACUTE ONLY): 44 min   Charges:   PT Evaluation $PT Eval Low Complexity: 1 Low PT Treatments $Gait Training: 8-22 mins $Therapeutic Exercise: 8-22 mins PT General Charges $$ ACUTE PT VISIT: 1 Visit         [place], PT Acute Rehab   [place] VEAR Drone 04/11/2024, 4:35 PM

## 2024-04-11 NOTE — Anesthesia Procedure Notes (Signed)
 Procedure Name: Intubation Date/Time: 04/11/2024 12:52 PM  Performed by: Vincenzo Show, CRNAPre-anesthesia Checklist: Patient identified, Emergency Drugs available, Suction available, Patient being monitored and Timeout performed Patient Re-evaluated:Patient Re-evaluated prior to induction Oxygen Delivery Method: Circle system utilized Preoxygenation: Pre-oxygenation with 100% oxygen Induction Type: IV induction Ventilation: Mask ventilation without difficulty and Oral airway inserted - appropriate to patient size Laryngoscope Size: Mac and 3 Grade View: Grade I Tube type: Oral Tube size: 7.0 mm Number of attempts: 1 Airway Equipment and Method: Stylet Placement Confirmation: ETT inserted through vocal cords under direct vision, positive ETCO2, CO2 detector and breath sounds checked- equal and bilateral Secured at: 22 cm Tube secured with: Tape Dental Injury: Teeth and Oropharynx as per pre-operative assessment
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
## 2024-04-14 ENCOUNTER — Encounter (HOSPITAL_COMMUNITY): Payer: Self-pay | Admitting: Orthopedic Surgery

## 2024-04-14 NOTE — Anesthesia Postprocedure Evaluation (Signed)
 Anesthesia Post Note  Patient: Kimberly Castro  Procedure(s) Performed: ARTHROSCOPY, KNEE, WITH LATERAL MENISCECTOMY (Left: Knee)     Patient location during evaluation: PACU Anesthesia Type: General Level of consciousness: awake and alert Pain management: pain level controlled Vital Signs Assessment: post-procedure vital signs reviewed and stable Respiratory status: spontaneous breathing, nonlabored ventilation, respiratory function stable and patient connected to nasal cannula oxygen Cardiovascular status: blood pressure returned to baseline and stable Postop Assessment: no apparent nausea or vomiting Anesthetic complications: no   No notable events documented.  Last Vitals:  Vitals:   04/11/24 1503 04/11/24 1526  BP:  (!) 160/56  Pulse:  (!) 56  Resp:  16  Temp:  36.6 C  SpO2: 100% 100%    Last Pain:  Vitals:   04/11/24 1630  TempSrc:   PainSc: 6                  Vinie Charity P Zira Helinski

## 2024-04-21 ENCOUNTER — Ambulatory Visit: Admitting: Pharmacist Clinician (PhC)/ Clinical Pharmacy Specialist

## 2024-04-21 NOTE — Progress Notes (Deleted)
 Office Visit    Patient Name: Kimberly Castro Date of Encounter: 04/21/2024  Primary Care Provider:  Corlis Pagan, NP Primary Cardiologist:  Annabella Scarce, MD  Chief Complaint    Weight management  Significant Past Medical History   OSA                 Allergies  Allergen Reactions   Lipitor [Atorvastatin ] Other (See Comments)    myalgias    History of Present Illness    Kimberly Castro is a 63 y.o. female patient of Dr ***, in the office today to discuss options for weight management.    *** If diabetic and on insulin /sulfonylurea, can consider reducing dose to reduce risk of hypoglycemia  *** Follow-up visit  Assess % weight loss Assess adverse effects Missed doses  Current weight management medications:   Previously tried meds:   Current meds that may affect weight:   Baseline weight/BMI:   Insurance payor:   Diet:   Exercise:   Family History:   Confirmed patient not ***pregnant and no personal or family history of medullary thyroid carcinoma (MTC) or Multiple Endocrine Neoplasia syndrome type 2 (MEN 2).   Social History:   Tobacco:  Alcohol:  Caffeine:   Adherence Assessment  Do you ever forget to take your medication? [] Yes [] No  Do you ever skip doses due to side effects? [] Yes [] No  Do you have trouble affording your medicines? [] Yes [] No  Are you ever unable to pick up your medication due to transportation difficulties? [] Yes [] No  Do you ever stop taking your medications because you don't believe they are helping? [] Yes [] No  Do you check your weight daily? [] Yes [] No   Adherence strategy: ***  Barriers to obtaining medications: ***   Accessory Clinical Findings    Lab Results  Component Value Date   CREATININE 0.91 04/04/2024   BUN 20 04/04/2024   NA 141 04/04/2024   K 4.8 04/04/2024   CL 106 04/04/2024   CO2 27 04/04/2024   Lab Results  Component Value Date   ALT 83 (H) 01/01/2024   AST 59 (H)  01/01/2024   ALKPHOS 202 (H) 01/01/2024   BILITOT 1.5 (H) 01/01/2024   Lab Results  Component Value Date   HGBA1C 4.9 07/18/2021      Home Medications/Allergies    Current Outpatient Medications  Medication Sig Dispense Refill   acetaminophen  (TYLENOL ) 500 MG tablet Take 1 tablet (500 mg total) by mouth every 6 (six) hours as needed for mild pain (pain score 1-3) or moderate pain (pain score 4-6).     amphetamine -dextroamphetamine  (ADDERALL XR) 30 MG 24 hr capsule Take 1 capsule (30 mg total) by mouth in the morning. (01-21-22) 30 capsule 0   buPROPion  (WELLBUTRIN  XL) 300 MG 24 hr tablet TAKE 1 TABLET(300 MG) BY MOUTH DAILY 90 tablet 1   cetirizine (ZYRTEC) 10 MG tablet Take 10 mg by mouth daily.     Cholecalciferol  125 MCG (5000 UT) capsule Take 5,000 Units by mouth daily.     cyanocobalamin  (VITAMIN B12) 1000 MCG/ML injection Inject 1 mL (1,000 mcg total) into the muscle every 30 (thirty) days. 1 mL 6   dapagliflozin  propanediol (FARXIGA ) 10 MG TABS tablet Take 1 tablet (10 mg total) by mouth daily before breakfast. 30 tablet 5   denosumab (PROLIA) 60 MG/ML SOSY injection Inject 60 mg into the skin every 6 (six) months.     ezetimibe  (ZETIA ) 10 MG tablet Take 10  mg by mouth daily.     fenoprofen (NALFON) 600 MG TABS tablet Take 600 mg by mouth daily at 12 noon.     ferrous sulfate 325 (65 FE) MG tablet Take 325 mg by mouth daily with breakfast.     fluticasone  (FLONASE ) 50 MCG/ACT nasal spray Place 1 spray into both nostrils daily.   1   gabapentin (NEURONTIN) 400 MG capsule Take 400 mg by mouth 2 (two) times daily.     HYDROcodone-acetaminophen  (NORCO) 7.5-325 MG tablet Take 1 tablet by mouth every 6 (six) hours as needed for moderate pain (pain score 4-6). 22 tablet 0   LYRICA  150 MG capsule Take 150 mg by mouth 2 (two) times daily.     metoprolol  tartrate (LOPRESSOR ) 25 MG tablet Take 1 tablet (25 mg total) by mouth 2 (two) times daily. 180 tablet 3   ondansetron  (ZOFRAN ) 4 MG  tablet Take 1 tablet (4 mg total) by mouth every 6 (six) hours as needed for nausea. 20 tablet 0   ondansetron  (ZOFRAN -ODT) 4 MG disintegrating tablet Take 1 tablet (4 mg total) by mouth every 8 (eight) hours as needed for vomiting or nausea. 20 tablet 0   QULIPTA  60 MG TABS TAKE 1 TABLET BY MOUTH EVERY DAY 30 tablet 6   REPATHA  SURECLICK 140 MG/ML SOAJ Inject 140 mg into the skin every 14 (fourteen) days. 2 mL 6   spironolactone  (ALDACTONE ) 25 MG tablet Take 0.5 tablets (12.5 mg total) by mouth daily. 45 tablet 3   traMADol (ULTRAM) 50 MG tablet Take 50 mg by mouth every 4 (four) hours as needed for moderate pain (pain score 4-6) or severe pain (pain score 7-10).     valsartan  (DIOVAN ) 160 MG tablet Take 160 mg by mouth daily.     Venlafaxine  HCl 225 MG TB24 Take 225 mg by mouth daily with breakfast.   1   zolpidem  (AMBIEN ) 10 MG tablet Take 10 mg by mouth at bedtime.  0   No current facility-administered medications for this visit.     Allergies  Allergen Reactions   Lipitor [email or web address] ] Other (See Comments)    myalgias    Assessment & Plan    No problem-specific Assessment & Plan notes found for this encounter.   Notnamed Scholz PharmD CPP [facility]  [address] [place], [place] [address] [phone]

## 2024-04-29 ENCOUNTER — Other Ambulatory Visit (HOSPITAL_BASED_OUTPATIENT_CLINIC_OR_DEPARTMENT_OTHER): Payer: Self-pay | Admitting: Family

## 2024-04-29 DIAGNOSIS — E782 Mixed hyperlipidemia: Secondary | ICD-10-CM

## 2024-04-29 DIAGNOSIS — I700 Atherosclerosis of aorta: Secondary | ICD-10-CM

## 2024-05-05 ENCOUNTER — Encounter: Payer: Self-pay | Admitting: Adult Health

## 2024-05-05 ENCOUNTER — Ambulatory Visit: Payer: 59 | Admitting: Adult Health
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
## 2024-05-09 ENCOUNTER — Telehealth: Payer: Self-pay | Admitting: Adult Health

## 2024-05-09 NOTE — Telephone Encounter (Signed)
 Pt has r/s her 1 yr f/u with wait list

## 2024-05-09 NOTE — Telephone Encounter (Signed)
 Pt has called to r/s her appointment, call connected to billing dept to address No Show fee

## 2024-05-20 ENCOUNTER — Other Ambulatory Visit (HOSPITAL_BASED_OUTPATIENT_CLINIC_OR_DEPARTMENT_OTHER): Payer: Self-pay | Admitting: Family

## 2024-05-20 DIAGNOSIS — E782 Mixed hyperlipidemia: Secondary | ICD-10-CM

## 2024-05-20 DIAGNOSIS — I700 Atherosclerosis of aorta: Secondary | ICD-10-CM

## 2024-05-27 DIAGNOSIS — Z0289 Encounter for other administrative examinations: Secondary | ICD-10-CM

## 2024-06-09 ENCOUNTER — Telehealth: Payer: Self-pay | Admitting: Pharmacy Technician

## 2024-06-09 ENCOUNTER — Ambulatory Visit (HOSPITAL_BASED_OUTPATIENT_CLINIC_OR_DEPARTMENT_OTHER): Admitting: Family

## 2024-06-09 ENCOUNTER — Ambulatory Visit: Attending: Cardiology | Admitting: Pharmacist

## 2024-06-09 ENCOUNTER — Other Ambulatory Visit (HOSPITAL_COMMUNITY): Payer: Self-pay

## 2024-06-09 ENCOUNTER — Encounter (HOSPITAL_BASED_OUTPATIENT_CLINIC_OR_DEPARTMENT_OTHER): Payer: Self-pay | Admitting: Family

## 2024-06-09 VITALS — BP 124/64 | HR 71 | Ht 64.0 in | Wt 354.2 lb

## 2024-06-09 DIAGNOSIS — I10 Essential (primary) hypertension: Secondary | ICD-10-CM | POA: Diagnosis not present

## 2024-06-09 DIAGNOSIS — Z79899 Other long term (current) drug therapy: Secondary | ICD-10-CM | POA: Diagnosis not present

## 2024-06-09 DIAGNOSIS — E782 Mixed hyperlipidemia: Secondary | ICD-10-CM

## 2024-06-09 DIAGNOSIS — I5032 Chronic diastolic (congestive) heart failure: Secondary | ICD-10-CM

## 2024-06-09 DIAGNOSIS — Z6841 Body Mass Index (BMI) 40.0 and over, adult: Secondary | ICD-10-CM

## 2024-06-09 MED ORDER — SPIRONOLACTONE 25 MG PO TABS: 25.0000 mg | ORAL_TABLET | Freq: Every day | ORAL | 1 refills | Status: AC

## 2024-06-09 NOTE — Telephone Encounter (Signed)
 Pharmacy Patient Advocate Encounter  Received notification from [facility] that Prior Authorization for zepbound  has been DENIED.  Full denial letter will be uploaded to the media tab. See denial reason below.   PA #/Case ID/Reference #: [id number]

## 2024-06-09 NOTE — Patient Instructions (Addendum)
 Wegovy   $199 per month for the first 2 months $349 for all other doses   Zepbound  $299 for 5mg  $ 399 for 5mg  $449 for 7.5 and up  Look into [employer]/pool at [facility]   GLP-1 Receptor Agonist Counseling Points This medication reduces your appetite and may make you feel fuller longer.  Stop eating when your body tells you that you are full. This will likely happen sooner than you are used to. Fried/greasy food and sweets may upset your stomach - minimize these as much as possible. Store your medication in the fridge until you are ready to use it. Inject your medication in the fatty tissue of your lower abdominal area (2 inches away from belly button) or upper outer thigh. Rotate injection sites. Common side effects include: nausea, diarrhea/constipation, and heartburn, and are more likely to occur if you overeat. Stop your injection for 7 days prior to surgical procedures requiring anesthesia.  Dosing schedule:  We will touch base with you monthly over the phone. The medication can be increased in monthly intervals depending on tolerability and efficacy.  Tips for success: Write down the reasons why you want to lose weight and post it in a place where you'll see it often.  Start small and work your way up. Keep in mind that it takes time to achieve goals, and small steps add up.  Any additional movements help to burn calories. Taking the stairs rather than the elevator and parking at the far end of your parking lot are easy ways to start. Brisk walking for at least 30 minutes 4 or more days of the week is an excellent goal to work toward  Understanding what it means to feel full: Did you know that it can take 15 minutes or more for your brain to receive the message that you've eaten? That means that, if you eat less food, but consume it slower, you may still feel satisfied.  Eating a lot of fruits and vegetables can also help you feel fuller.  Eat off of smaller plates so  that moderate portions don't seem too small  Tips for living a healthier life     Building a Healthy and Balanced Diet Make most of your meal vegetables and fruits -  of your plate. Aim for color and variety, and remember that potatoes dont count as vegetables on the Healthy Eating Plate because of their negative impact on blood sugar.  Go for whole grains -  of your plate. Whole and intact grains--whole wheat, barley, wheat berries, quinoa, oats, brown rice, and foods made with them, such as whole wheat pasta--have a milder effect on blood sugar and insulin  than white bread, white rice, and other refined grains.  Protein power -  of your plate. Fish, poultry, beans, and nuts are all healthy, versatile protein sources--they can be mixed into salads, and pair well with vegetables on a plate. Limit red meat, and avoid processed meats such as bacon and sausage.  Healthy plant oils - in moderation. Choose healthy vegetable oils like olive, canola, soy, corn, sunflower, peanut, and others, and avoid partially hydrogenated oils, which contain unhealthy trans fats. Remember that low-fat does not mean healthy.  Drink water, coffee, or tea. Skip sugary drinks, limit milk and dairy products to one to two servings per day, and limit juice to a small glass per day.  Stay active. The red figure running across the Healthy Eating Plates placemat is a reminder that staying active is also important in  weight control.  The main message of the Healthy Eating Plate is to focus on diet quality:  The type of carbohydrate in the diet is more important than the amount of carbohydrate in the diet, because some sources of carbohydrate--like vegetables (other than potatoes), fruits, whole grains, and beans--are healthier than others. The Healthy Eating Plate also advises consumers to avoid sugary beverages, a major source of calories--usually with little nutritional value--in the American diet. The Healthy  Eating Plate encourages consumers to use healthy oils, and it does not set a maximum on the percentage of calories people should get each day from healthy sources of fat. In this way, the Healthy Eating Plate recommends the opposite of the low-fat message promoted for decades by the USDA.  cuetune.com.ee  SUGAR  Sugar is a huge problem in the modern day diet. Sugar is a big contributor to heart disease, diabetes, high triglyceride levels, fatty liver disease and obesity. Sugar is hidden in almost all packaged foods/beverages. Added sugar is extra sugar that is added beyond what is naturally found and has no nutritional benefit for your body. The American Heart Association recommends limiting added sugars to no more than 25g for women and 36 grams for men per day. There are many names for sugar including maltose, sucrose (names ending in ose), high fructose corn syrup, molasses, cane sugar, corn sweetener, raw sugar, syrup, honey or fruit juice concentrate.   One of the best ways to limit your added sugars is to stop drinking sweetened beverages such as soda, sweet tea, and fruit juice.  There is 65g of added sugars in one 20oz bottle of Coke! That is equal to 7.5 donuts.   Pay attention and read all nutrition facts labels. Below is an examples of a nutrition facts label. The #1 is showing you the total sugars where the # 2 is showing you the added sugars. This one serving has almost the max amount of added sugars per day!   EXERCISE  Exercise is good. Weve all heard that. In an ideal world, we would all have time and resources to get plenty of it. When you are active, your heart pumps more efficiently and you will feel better.  Multiple studies show that even walking regularly has benefits that include living a longer life. The American Heart Association recommends 150 minutes per week of exercise (30 minutes per day most days of the week). You  can do this in any increment you wish. Nine or more 10-minute walks count. So does an hour-long exercise class. Break the time apart into what will work in your life. Some of the best things you can do include walking briskly, jogging, cycling or swimming laps. Not everyone is ready to exercise. Sometimes we need to start with just getting active. Here are some easy ways to be more active throughout the day:  Take the stairs instead of the elevator  Go for a 10-15 minute walk during your lunch break (find a friend to make it more enjoyable)  When shopping, park at the back of the parking lot  If you take public transportation, get off one stop early and walk the extra distance  Pace around while making phone calls  Check with your doctor if you arent sure what your limitations may be. Always remember to drink plenty of water when doing any type of exercise. Dont feel like a failure if youre not getting the 90-150 minutes per week. If you started by being a couch potato,  then just a 10-minute walk each day is a huge improvement. Start with little victories and work your way up.   HEALTHY EATING TIPS              Plan ahead: make a menu of the meals for a week then create a grocery list to go with that menu. Consider meals that easily stretch into a night of leftovers, such as stews or casseroles. Or consider making two of your favorite meal and put one in the freezer for another night. Try a night or two each week that is meatless or no cook such as salads. When you get home from the grocery store wash and prepare your vegetables and fruits. Then when you need them they are ready to go.   Tips for going to the grocery store:  Buy store or generic brands  Check the weekly ad from your store on-line or in their in-store flyer  Look at the unit price on the shelf tag to compare/contrast the costs of different items  Buy fruits/vegetables in season  Carrots, bananas and apples are low-cost,  naturally healthy items  If meats or frozen vegetables are on sale, buy some extras and put in your freezer  Limit buying prepared or ready to eat items, even if they are pre-made salads or fruit snacks  Do not shop when youre hungry  Foods at eye level tend to be more expensive. Look on the high and low shelves for deals.  Consider shopping at the farmers market for fresh foods in season.  Avoid the cookie and chip aisles (these are expensive, high in calories and low in nutritional value). Shop on the outside of the grocery store.  Healthy food preparations:  If you cant get lean hamburger, be sure to drain the fat when cooking  Steam, saut (in olive oil), grill or bake foods  Experiment with different seasonings to avoid adding salt to your foods. Kosher salt, sea salt and Himalayan salt are all still salt and should be avoided. Try seasoning food with onion, garlic, thyme, rosemary, basil ect. Onion powder or garlic powder is ok. Avoid if it says salt (ie garlic salt).

## 2024-06-09 NOTE — Progress Notes (Signed)
 " Cardiology Office Note:  .   Date:  06/09/2024  ID:  Kimberly Castro, DOB Apr 12, 1961, MRN [id number] PCP: Corlis Pagan, NP  [facility] Providers Cardiologist:  Annabella Scarce, MD    History of Present Illness: .   Kimberly Castro is a 63 y.o. female with history of OSA on CPAP, HLD, HTN, fibromyalgia, diastolic heart failure.   Established 03/2016 for palpitations with 14 day monitor showing PAC/PVC. TSH normal. D-dimer elevated with CTA negative for PE. Echo 03/2017 normal LVEF 55-60%. Myoview  04/2017 due to dyspnea normal LVEF 55%, breast attenuation artifact, no ischemia.   Repeat Myoview  05/2019 due to exertional dyspnea LVEF 60%, no ischemia.  Echo 05/2019 LVEF 61%, grade 2 diastolic dysfunction, RA pressure 8 mmHg.  Seen by Dr. Scarce 03/09/2023 struggling with weight gain after discontinuation of Mounjaro  due to cost. Recommended to continue to follow with weight loss clinic. Lasix  changed to daily dosing due to LE edema. Hydrochlorothiazide  stopped, Valsartan  160mg  daily initiated.   At visit 06/08/23 potassium stopped and Spironolactone  12.5mg  initiated for edema. Had seen [facility] with  venous reflux study demonstrating reflux in proximal GSSV and common femoral although not responsible for edema. She was referred to lymphedema clinic by [facility].   At visit 08/2023 Nexlizet  added to lipid lowering regimen of Repatha  but did not tolerate with diarrhea.   Echo 12/2023 normal LVEF 55-60%, mild LVH, gr1dd, elevated LVEDP, RVSF mildly reduced, moderately elevated PASP. , trivial MR. , aortic valve sclerosis without stenosis. This was performed during admission for gallstone pancreatitis.  Seen by Dr. Scarce 03/13/24. She was not tolerating her lymphedema pumps. Diastolic dysfunction suspected, SGLT2i initiated. Referred to PharmD for consideration of GLP1.   Underwent knee surgery 04/11/24.   Presents today for follow up. 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 Weight down  6 lbs from clinic visit 3 months ago. Reports recovering well after knee surgery. She is participating in PT twice per week. Reports her swelling has been terrible. She is wearing compression sleeves at night, encouraged to wear during the daytime. Sits at a desk during the day. She does have a small stool under her desk. Tolerating Farxiga  without issue. She reports she has not been able to use her lymphedema pumps due to knee pain.   Seeing PharmD later today to discuss GLP1. Reports most recent A1c with PCP 5.4 and has never been diagnosed with DM2. Mounjaro  previously prescribed for insulin  resistance and tolerated well but became cost prohibitive with insurance coverage changes. She has OSA for which she wears CPAP regularly. Prior sleep study at [facility] per her report with severe sleep apnea.   ROS: Please see the history of present illness.    All other systems reviewed and are negative.   Studies Reviewed: .        Cardiac Studies & Procedures   ______________________________________________________________________________________________   STRESS TESTS  MYOCARDIAL PERFUSION IMAGING 05/28/2019  Interpretation Summary  Nuclear stress EF: 60%.  The study is normal.  This is a low risk study.  There was no ST segment deviation noted during stress.  No T wave inversion was noted during stress.  Low risk stress nuclear study with normal perfusion and normal left ventricular regional and global systolic function.   ECHOCARDIOGRAM  ECHOCARDIOGRAM COMPLETE 12/31/2023  Narrative ECHOCARDIOGRAM REPORT    Patient Name:   Kimberly Castro Date of Exam: 12/31/2023 Medical Rec #:  [id number]           Height:  64.0 in Accession #:    [id number]          Weight:       367.9 lb Date of Birth:  [birth year]           BSA:          2.535 m Patient Age:    63 years            BP:           134/71 mmHg Patient Gender: F                   HR:           63 bpm. Exam  Location:  Inpatient  Procedure: 2D Echo, Cardiac Doppler and Color Doppler (Both Spectral and Color Flow Doppler were utilized during procedure).  Indications:    Pre-op evaluation  History:        Patient has prior history of Echocardiogram examinations, most recent 04/02/2019. Risk Factors:Hypertension, Dyslipidemia and Morbid obesity.  Referring Phys: [id number] ANGELA NICOLE DUKE  IMPRESSIONS   1. Left ventricular ejection fraction, by estimation, is 55 to 60%. The left ventricle has normal function. The left ventricle has no regional wall motion abnormalities. There is mild concentric left ventricular hypertrophy. Left ventricular diastolic parameters are consistent with Grade I diastolic dysfunction (impaired relaxation). Elevated left ventricular end-diastolic pressure. 2. Right ventricular systolic function is mildly reduced. The right ventricular size is normal. There is moderately elevated pulmonary artery systolic pressure. 3. Left atrial size was severely dilated. 4. The mitral valve is normal in structure. Trivial mitral valve regurgitation. No evidence of mitral stenosis. 5. The aortic valve is tricuspid. There is mild calcification of the aortic valve. There is mild thickening of the aortic valve. Aortic valve regurgitation is trivial. Aortic valve sclerosis/calcification is present, without any evidence of aortic stenosis. Aortic valve area, by VTI measures 2.23 cm. Aortic valve mean gradient measures 7.0 mmHg. Aortic valve Vmax measures 1.87 m/s. 6. The inferior vena cava is dilated in size with <50% respiratory variability, suggesting right atrial pressure of 15 mmHg.  FINDINGS Left Ventricle: Left ventricular ejection fraction, by estimation, is 55 to 60%. The left ventricle has normal function. The left ventricle has no regional wall motion abnormalities. The left ventricular internal cavity size was normal in size. There is mild concentric left ventricular hypertrophy.  Left ventricular diastolic parameters are consistent with Grade I diastolic dysfunction (impaired relaxation). Elevated left ventricular end-diastolic pressure.  Right Ventricle: The right ventricular size is normal. No increase in right ventricular wall thickness. Right ventricular systolic function is mildly reduced. There is moderately elevated pulmonary artery systolic pressure. The tricuspid regurgitant velocity is 3.26 m/s, and with an assumed right atrial pressure of 15 mmHg, the estimated right ventricular systolic pressure is 57.5 mmHg.  Left Atrium: Left atrial size was severely dilated.  Right Atrium: Right atrial size was normal in size.  Pericardium: There is no evidence of pericardial effusion.  Mitral Valve: The mitral valve is normal in structure. Trivial mitral valve regurgitation. No evidence of mitral valve stenosis.  Tricuspid Valve: The tricuspid valve is normal in structure. Tricuspid valve regurgitation is mild . No evidence of tricuspid stenosis.  Aortic Valve: The aortic valve is tricuspid. There is mild calcification of the aortic valve. There is mild thickening of the aortic valve. Aortic valve regurgitation is trivial. Aortic valve sclerosis/calcification is present, without any evidence of aortic stenosis. Aortic valve mean gradient measures 7.0 mmHg. Aortic  valve peak gradient measures 14.0 mmHg. Aortic valve area, by VTI measures 2.23 cm.  Pulmonic Valve: The pulmonic valve was normal in structure. Pulmonic valve regurgitation is not visualized. No evidence of pulmonic stenosis.  Aorta: The aortic root is normal in size and structure.  Venous: The inferior vena cava is dilated in size with less than 50% respiratory variability, suggesting right atrial pressure of 15 mmHg.  IAS/Shunts: No atrial level shunt detected by color flow Doppler.   LEFT VENTRICLE PLAX 2D LVIDd:         5.00 cm      Diastology LVIDs:         3.40 cm      LV e' medial:    5.66  cm/s LV PW:         1.40 cm      LV E/e' medial:  15.1 LV IVS:        1.10 cm      LV e' lateral:   5.00 cm/s LVOT diam:     2.20 cm      LV E/e' lateral: 17.1 LV SV:         101 LV SV Index:   40 LVOT Area:     3.80 cm  LV Volumes (MOD) LV vol d, MOD A2C: 109.0 ml LV vol d, MOD A4C: 122.0 ml LV vol s, MOD A2C: 41.5 ml LV vol s, MOD A4C: 49.5 ml LV SV MOD A2C:     67.5 ml LV SV MOD A4C:     122.0 ml LV SV MOD BP:      72.2 ml  RIGHT VENTRICLE            IVC RV Basal diam:  3.90 cm    IVC diam: 2.60 cm RV S prime:     8.05 cm/s TAPSE (M-mode): 3.0 cm  LEFT ATRIUM             Index LA diam:        4.80 cm 1.89 cm/m LA Vol (A2C):   73.4 ml 28.95 ml/m LA Vol (A4C):   99.2 ml 39.13 ml/m LA Biplane Vol: 94.1 ml 37.12 ml/m AORTIC VALVE AV Area (Vmax):    2.15 cm AV Area (Vmean):   2.15 cm AV Area (VTI):     2.23 cm AV Vmax:           187.00 cm/s AV Vmean:          125.000 cm/s AV VTI:            0.452 m AV Peak Grad:      14.0 mmHg AV Mean Grad:      7.0 mmHg LVOT Vmax:         106.00 cm/s LVOT Vmean:        70.700 cm/s LVOT VTI:          0.265 m LVOT/AV VTI ratio: 0.59  AORTA Ao Root diam: 3.10 cm Ao Asc diam:  3.70 cm  MITRAL VALVE                TRICUSPID VALVE MV Area (PHT): 3.20 cm     TR Peak grad:   42.5 mmHg MV Decel Time: 237 msec     TR Vmax:        326.00 cm/s MV E velocity: 85.30 cm/s MV A velocity: 103.00 cm/s  SHUNTS MV E/A ratio:  0.83         Systemic VTI:  0.26 m  Systemic Diam: 2.20 cm  Annabella Scarce MD Electronically signed by Annabella Scarce MD Signature Date/Time: 12/31/2023/10:02:15 AM    Final    MONITORS  CARDIAC EVENT MONITOR 04/03/2016  Narrative 11 Day Event Monitor  Quality: Fair.  Baseline artifact. Predominant rhythm: sinus rhythm  PVCs, PACs and atrial runs were noted   Tiffany C. Scarce, MD, [facility] 04/25/2016 9:59 AM        ______________________________________________________________________________________________         Risk Assessment/Calculations:             Physical Exam:   VS:  BP 124/64   Pulse 71   Ht 5' 4 (1.626 m)   Wt (!) 354 lb 3.2 oz (160.7 kg)   SpO2 95%   BMI 60.80 kg/m    Wt Readings from Last 3 Encounters:  06/09/24 (!) 354 lb 3.2 oz (160.7 kg)  04/11/24 (!) 358 lb 14.5 oz (162.8 kg)  04/04/24 (!) 358 lb 14.5 oz (162.8 kg)    GEN: Well nourished, overweight, well developed in no acute distress NECK: No JVD; No carotid bruits CARDIAC: RRR, no murmurs, rubs, gallops RESPIRATORY:  Clear to auscultation without rales, wheezing or rhonchi  ABDOMEN: Soft, non-tender, non-distended EXTREMITIES:  bilateral 2+ pitting edema; No deformity   ASSESSMENT AND PLAN: .    Diastolic heart failure / LE edema / Venous insufficiency / Lymphedema - Echo 12/2023 normal LVEF 55-60%, mild LVH, gr1dd, elevated LVEDP, RVSF mildly reduced, moderately elevated PASP. , trivial MR. , aortic valve sclerosis without stenosis. LE edema multifactorial lymphedema, venous insufficiency, diastolic heart failure HFpEF GDMT includes lopressor  25mg  BID, Spironolactone  increase to 25mg  daily today. Advised to wear compression stockings during the day, not at night Hopeful to return to lymphedema pumps soon, interruption due to recent knee surgery. If LE edema persistently difficult to control, consider loop diuretic.   HTN - BP well controlled. Due to LE edema, increase Spironolactone  from 12.5mg  to 25mg . BMP in 1-2 weeks. Continue Valsartan  160mg  daily. Discussed to monitor BP at home at least 2 hours after medications and sitting for 5-10 minutes.   Mild AS - By echo 03/2023. Echo 12/2023 with no AS. Monitor as clinically indicated.   Hypokalemia - Potassium previously switched to spironolactone . Stable by labs 03/2024. Repeat BMET in 1-2 weeks.  Morbid obesity - Prior gastric bypass 20 years ago.  Mounjaro   stopped due to cost issues. Re-establishing with [facility] in a few weeks. PharmD visit later today to discuss GLP1.  HLD / Aortic atherosclerosis - Continue Repatha  140mg  q14 days, Metoprolol  tartrate 25mg  BID. Did not tolerate Nexlizet  with diarrhea.   PAC/PVC - Quiescent on Lopressor  25mg  BID, continue same.       Dispo: follow up in 6 months  Signed, Reche GORMAN Finder, NP    "

## 2024-06-09 NOTE — Telephone Encounter (Signed)
 Pharmacy Patient Advocate Encounter   Received notification from Physician's Office that prior authorization for zepbound  2.5 MG is required/requested.   Insurance verification completed.   The patient is insured through [facility].   Per test claim: PA required; PA submitted to above mentioned insurance via Latent Key/confirmation #/EOC [id number] Status is pending

## 2024-06-09 NOTE — Patient Instructions (Signed)
 Medication Instructions:   INCREASE your spironolactone  to 25 mg by mouth daily   Labwork:  RETURN in 1-2 weeks here at [facility] 3rd floor [address]--BMET      Follow-Up: Please follow up in _4_ months with Dr. Raford, Reche Finder, NP or Rosaline Bane, NP   Special Instructions:   Wear your compression stockings during the day instead of at night.

## 2024-06-09 NOTE — Progress Notes (Signed)
 Patient ID: Kimberly Castro                 DOB: 05/16/1961                    MRN: [id number]     HPI: Kimberly Castro is a 63 y.o. female patient referred to pharmacy clinic by Kimberly Castro to initiate GLP1-RA therapy. PMH is significant for  OSA on CPAP, HLD, HTN, fibromyalgia, diastolic heart failure and obesity. Most recent BMI 60.8 kg/m .  Baseline weight and BMI: 60.8 kg/m / 160.7 kg   Patient reports today to Pharm.D. clinic.  Reports that she was previously on Mounjaro .  Had titrated all the way up to 15 mg and did very well.  Unfortunately then insurance company started with tighter coverage and she was no longer able to get Mounjaro .  States that she did okay for the first 6 months just slowly gaining a little bit of weight back.  But then states she ended up gaining all the weight back plus some.  Recently had surgery on her meniscus.  Thinks she may need surgery on the other knee as well.  Admits that she does okay with food choices and portions at meals but snacks a lot at work which is really her downfall.  Admits that when she stopped Mounjaro  the food noise was still quiet for about the first 6 months but then simply was unable to control her urges to snack.  Exercise is limited due to her size and knee pain.  She is currently doing physical therapy twice a week.  We discussed water aerobics/walking in a pool which she is very interested in.  Very interested in looking into what [facility] in the pool at [facility] can offer.  Unfortunately her insurance will not pay for Zepbound  even for OSA as it is an excluded benefit.  I did discuss cash price options with patient who is interested but would like to discuss with her husband first.  I think I should be able to get her a sample of the 2.5 mg from the reps.  Patient did ask me about her gabapentin and Lyrica .  She states she was told by someone in the hospital that she should not be on both.  I advised that they are very  similar medications and generally we try gabapentin first and if it is ineffective we will switch to Lyrica .  I recommended that she only take the Lyrica .  Diet:  Special K w/ 1% milk Smart one for lunch A lot of snacking at work Grilled protein, crockpot, w/ vegetables  Exercise: PT twice a week for knee  Family History:   Social History:   Labs: Lab Results  Component Value Date   HGBA1C 4.9 07/18/2021    Wt Readings from Last 1 Encounters:  04/11/24 (!) 358 lb 14.5 oz (162.8 kg)    BP Readings from Last 1 Encounters:  04/11/24 (!) 160/56   Pulse Readings from Last 1 Encounters:  04/11/24 (!) 56       Component Value Date/Time   CHOL 213 (H) 09/11/2023 0832   TRIG 110 09/11/2023 0832   HDL 96 09/11/2023 0832   CHOLHDL 2.2 09/11/2023 0832   LDLCALC 98 09/11/2023 0832    Past Medical History:  Diagnosis Date   (HFpEF) heart failure with preserved ejection fraction (HCC) 03/13/2024   ADD (attention deficit disorder)    Anemia    Anxiety    Aortic  atherosclerosis 03/09/2023   Aortic stenosis    Arthritis    B12 deficiency    Back pain    Chronic fatigue    CKD (chronic kidney disease), stage III (HCC)    Complication of anesthesia    during colonoscopy BP dropped very low   Constipation    Depression    Fatigue 06/10/2020   Fatty liver    Fibromyalgia    Headache    Heart murmur    no problems with it   Heart valve malfunction    Hypercholesteremia    Hypertension    Hypoglycemia    IBS (irritable bowel syndrome)    Osteopenia    Palpitations 03/31/2016   anxiety related   Sleep apnea    uses cpap   SOB (shortness of breath)    Statin intolerance 03/09/2023    Medications Ordered Prior to Encounter[1]  Allergies[2]   Assessment/Plan:  1. Weight loss - Patient has not met goal of at least 5% of body weight loss with comprehensive lifestyle modifications alone in the past 3-6 months. Pharmacotherapy is appropriate to pursue as  augmentation. Will start Zepbound  2.5 mg weekly. Confirmed patient not pregnant and no personal or family history of medullary thyroid carcinoma (MTC) or Multiple Endocrine Neoplasia syndrome type 2 (MEN 2).  Had gallbladder removed, no history of pancreatitis.  Injection technique reviewed at today's visit.  Advised patient on common side effects including nausea, diarrhea, dyspepsia, decreased appetite, and fatigue. Counseled patient on reducing meal size and how to titrate medication to minimize side effects. Counseled patient to call if intolerable side effects or if experiencing dehydration, abdominal pain, or dizziness. Along with pharmacotherapy, the patient will follow dietary modifications and aim for at least 150 minutes of moderate-intensity exercise per week, plus resistance training twice a week (as recommended by the American Heart Association). This resistance training--such as weightlifting, bodyweight exercises, or using resistance bands, adapted to the patients ability--will help prevent muscle loss.  Will wait to hear back from patient if she would like to pursue cash price options. If therapy is initiated, phone follow-ups will be conducted every 4 weeks for dose titration until the patient reaches the effective therapeutic dose and target weight.  Kimberly Castro, Pharm.Kimberly Castro, CPP [facility] [address], [place], [place] [address]  Phone: [phone]; Fax: [phone]       [1]  Current Outpatient Medications on File Prior to Visit  Medication Sig Dispense Refill   acetaminophen  (TYLENOL ) 500 MG tablet Take 1 tablet (500 mg total) by mouth every 6 (six) hours as needed for mild pain (pain score 1-3) or moderate pain (pain score 4-6).     amphetamine -dextroamphetamine  (ADDERALL XR) 30 MG 24 hr capsule Take 1 capsule (30 mg total) by mouth in the morning. (01-21-22) 30 capsule 0   buPROPion  (WELLBUTRIN  XL) 300 MG  24 hr tablet TAKE 1 TABLET(300 MG) BY MOUTH DAILY 90 tablet 1   cetirizine (ZYRTEC) 10 MG tablet Take 10 mg by mouth daily.     Cholecalciferol  125 MCG (5000 UT) capsule Take 5,000 Units by mouth daily.     cyanocobalamin  (VITAMIN B12) 1000 MCG/ML injection Inject 1 mL (1,000 mcg total) into the muscle every 30 (thirty) days. 1 mL 6   dapagliflozin  propanediol (FARXIGA ) 10 MG TABS tablet Take 1 tablet (10 mg total) by mouth daily before breakfast. 30 tablet 5   denosumab (PROLIA) 60 MG/ML SOSY injection  Inject 60 mg into the skin every 6 (six) months.     Evolocumab  (REPATHA  SURECLICK) 140 MG/ML SOAJ INJECT 140MG  ( ) EVERY 14 DAYS 6 mL 1   ezetimibe  (ZETIA ) 10 MG tablet TAKE 1 TABLET BY MOUTH DAILY 90 tablet 2   fenoprofen (NALFON) 600 MG TABS tablet Take 600 mg by mouth daily at 12 noon.     ferrous sulfate 325 (65 FE) MG tablet Take 325 mg by mouth daily with breakfast.     fluticasone  (FLONASE ) 50 MCG/ACT nasal spray Place 1 spray into both nostrils daily.   1   gabapentin (NEURONTIN) 400 MG capsule Take 400 mg by mouth 2 (two) times daily.     HYDROcodone -acetaminophen  (NORCO) 7.5-325 MG tablet Take 1 tablet by mouth every 6 (six) hours as needed for moderate pain (pain score 4-6). 22 tablet 0   LYRICA  150 MG capsule Take 150 mg by mouth 2 (two) times daily.     metoprolol  tartrate (LOPRESSOR ) 25 MG tablet Take 1 tablet (25 mg total) by mouth 2 (two) times daily. 180 tablet 3   ondansetron  (ZOFRAN ) 4 MG tablet Take 1 tablet (4 mg total) by mouth every 6 (six) hours as needed for nausea. 20 tablet 0   ondansetron  (ZOFRAN -ODT) 4 MG disintegrating tablet Take 1 tablet (4 mg total) by mouth every 8 (eight) hours as needed for vomiting or nausea. 20 tablet 0   QULIPTA  60 MG TABS TAKE 1 TABLET BY MOUTH EVERY DAY 30 tablet 6   spironolactone  (ALDACTONE ) 25 MG tablet Take 0.5 tablets (12.5 mg total) by mouth daily. 45 tablet 3   traMADol (ULTRAM) 50 MG tablet Take 50 mg by mouth every 4 (four) hours  as needed for moderate pain (pain score 4-6) or severe pain (pain score 7-10).     valsartan  (DIOVAN ) 160 MG tablet Take 160 mg by mouth daily.     Venlafaxine  HCl 225 MG TB24 Take 225 mg by mouth daily with breakfast.   1   zolpidem  (AMBIEN ) 10 MG tablet Take 10 mg by mouth at bedtime.  0   No current facility-administered medications on file prior to visit.  [2]  Allergies Allergen Reactions   Lipitor [Atorvastatin ] Other (See Comments)    myalgias

## 2024-06-18 ENCOUNTER — Encounter: Payer: Self-pay | Admitting: Pharmacist

## 2024-06-23 ENCOUNTER — Other Ambulatory Visit: Payer: Self-pay | Admitting: Adult Health

## 2024-06-25 ENCOUNTER — Ambulatory Visit (INDEPENDENT_AMBULATORY_CARE_PROVIDER_SITE_OTHER): Admitting: Nurse Practitioner

## 2024-06-26 MED ORDER — ZEPBOUND 2.5 MG/0.5ML ~~LOC~~ SOLN: 2.5000 mg | SUBCUTANEOUS | 0 refills | Status: AC

## 2024-07-08 ENCOUNTER — Ambulatory Visit (INDEPENDENT_AMBULATORY_CARE_PROVIDER_SITE_OTHER): Admitting: Nurse Practitioner

## 2024-07-14 ENCOUNTER — Other Ambulatory Visit (HOSPITAL_COMMUNITY): Payer: Self-pay

## 2024-07-14 ENCOUNTER — Telehealth: Payer: Self-pay | Admitting: Pharmacy Technician

## 2024-07-14 NOTE — Telephone Encounter (Signed)
" ° °  Pharmacy Patient Advocate Encounter   Received notification from [facility] KEY that prior authorization for DAPAGLIFLOZIN   is required/requested.   Insurance verification completed.   The patient is insured through [employer].   Per CMM- [id number]    Unable to do test claim in wam "

## 2024-07-15 ENCOUNTER — Encounter (INDEPENDENT_AMBULATORY_CARE_PROVIDER_SITE_OTHER): Payer: Self-pay

## 2024-07-15 ENCOUNTER — Ambulatory Visit (INDEPENDENT_AMBULATORY_CARE_PROVIDER_SITE_OTHER): Admitting: Nurse Practitioner

## 2024-07-28 ENCOUNTER — Ambulatory Visit (INDEPENDENT_AMBULATORY_CARE_PROVIDER_SITE_OTHER): Admitting: Family Medicine

## 2024-07-29 ENCOUNTER — Ambulatory Visit (INDEPENDENT_AMBULATORY_CARE_PROVIDER_SITE_OTHER): Admitting: Nurse Practitioner

## 2024-08-11 ENCOUNTER — Ambulatory Visit (INDEPENDENT_AMBULATORY_CARE_PROVIDER_SITE_OTHER): Admitting: Family Medicine

## 2024-09-19 ENCOUNTER — Ambulatory Visit (HOSPITAL_BASED_OUTPATIENT_CLINIC_OR_DEPARTMENT_OTHER): Admitting: Family

## 2024-12-22 ENCOUNTER — Ambulatory Visit: Admitting: Adult Health
